# Patient Record
Sex: Female | Born: 1960
Health system: Southern US, Community
[De-identification: ages and names within clinical notes are randomized; demographics above are authoritative.]

## PROBLEM LIST (undated history)

## (undated) DIAGNOSIS — E785 Hyperlipidemia, unspecified: Secondary | ICD-10-CM

## (undated) DIAGNOSIS — K649 Unspecified hemorrhoids: Secondary | ICD-10-CM

## (undated) DIAGNOSIS — I10 Essential (primary) hypertension: Secondary | ICD-10-CM

## (undated) DIAGNOSIS — R7303 Prediabetes: Secondary | ICD-10-CM

## (undated) HISTORY — PX: TUBAL LIGATION: SHX77

## (undated) HISTORY — DX: Essential (primary) hypertension: I10

## (undated) HISTORY — DX: Unspecified hemorrhoids: K64.9

---

## 2017-01-25 ENCOUNTER — Encounter (INDEPENDENT_AMBULATORY_CARE_PROVIDER_SITE_OTHER): Payer: Self-pay | Admitting: Physician Assistant

## 2017-01-25 ENCOUNTER — Ambulatory Visit (INDEPENDENT_AMBULATORY_CARE_PROVIDER_SITE_OTHER): Payer: Self-pay | Admitting: Physician Assistant

## 2017-01-25 VITALS — BP 134/71 | HR 53 | Temp 98.6°F | Ht 62.5 in | Wt 186.2 lb

## 2017-01-25 DIAGNOSIS — S39012A Strain of muscle, fascia and tendon of lower back, initial encounter: Secondary | ICD-10-CM

## 2017-01-25 DIAGNOSIS — K0889 Other specified disorders of teeth and supporting structures: Secondary | ICD-10-CM

## 2017-01-25 DIAGNOSIS — R490 Dysphonia: Secondary | ICD-10-CM

## 2017-01-25 DIAGNOSIS — K219 Gastro-esophageal reflux disease without esophagitis: Secondary | ICD-10-CM

## 2017-01-25 MED ORDER — IBUPROFEN 600 MG PO TABS: 600.0000 mg | ORAL_TABLET | Freq: Three times a day (TID) | ORAL | 0 refills | Status: DC | PRN

## 2017-01-25 MED ORDER — OMEPRAZOLE 40 MG PO CPDR: 40.0000 mg | DELAYED_RELEASE_CAPSULE | Freq: Every day | ORAL | 3 refills | Status: DC

## 2017-01-25 MED ORDER — CYCLOBENZAPRINE HCL 10 MG PO TABS: 10.0000 mg | ORAL_TABLET | Freq: Three times a day (TID) | ORAL | 0 refills | Status: DC | PRN

## 2017-01-25 NOTE — Patient Instructions (Signed)
Esófago de Barrett  (Barrett Esophagus)  El esófago de Barrett aparece cuando el tejido que recubre el esófago se modifica o se daña. El esófago es el tubo que transporta los alimentos desde la boca al estómago. Con el esófago de Barrett, las células que recubren el esófago son reemplazadas por células similares a la mucosa del intestino (metaplasia intestinal).  Es posible que el esófago de Barrett por sí solo no cause síntomas. Sin embargo, muchas personas con esta afección también tienen enfermedad por reflujo gastroesofágico (ERGE), que puede causar síntomas como, por ejemplo, la acidez estomacal. El tratamiento puede incluir medicamentos, procedimientos para destruir las células anormales o cirugía. Con el tiempo, algunas personas con esta afección pueden tener cáncer de esófago.  CAUSAS  Se desconoce la causa exacta de esta afección. En algunos casos, se produce a partir de daños en el revestimiento del esófago a causa de la ERGE. La ERGE ocurre cuando los ácidos estomacales retroceden desde el estómago al esófago. Los síntomas frecuentes de ERGE pueden causar metaplasia intestinal o cambios a nivel celular (displasia).  FACTORES DE RIESGO  Los siguientes factores pueden hacer que usted sea propenso a sufrir esta afección:  · Tener ERGE.  · Tener cualquiera de las siguientes características:  ? Ser hombre.  ? Ser blanco (caucásico).  ? Obesos.  ? Ser mayor de 50 años.  · Tener una hernia de hiato.  · Fumar.  SÍNTOMAS  Con frecuencia, las personas que tienen esófago de Barrett no tienen síntomas. Sin embargo, muchas personas que padecen esta afección también tienen ERGE. Los síntomas de ERGE pueden incluir lo siguiente:  · Acidez estomacal.  · Dificultad para tragar.  · Tos seca.  DIAGNÓSTICO  El diagnóstico del esófago de Barrett se puede realizar por medio de un examen llamado endoscopia gastrointestinal superior. Durante este examen, se introduce un tubo delgado y flexible (endoscopio) hasta llegar al  esófago. El endoscopio tiene una luz y una cámara en el extremo. El médico usa el endoscopio para observar el interior del esófago. Durante el examen, se tomarán varias muestras de tejido (biopsias) del esófago para poder analizarlas en busca de metaplasia intestinal o displasia.  TRATAMIENTO  El tratamiento de esta afección puede incluir lo siguiente:  · Medicamentos (inhibidores de la bomba de protones o IBP) para reducir o detener la ERGE.  · Exámenes endoscópicos periódicos para asegurarse de que no aparezca cáncer.  · Un procedimiento o una cirugía para la displasia. Esto puede incluir lo siguiente:  ? La extirpación o destrucción endoscópica de las células anormales.  ? La extirpación de parte del esófago (esofagectomía).  INSTRUCCIONES PARA EL CUIDADO EN EL HOGAR  Comida y bebida  · Coma más frutas y verduras frescas.  · Evite los alimentos grasosos.  · Haga comidas pequeñas durante el día en lugar de comidas abundantes.  · No consuma los alimentos que le causan acidez estomacal. Ellos son:  ? Café y bebidas alcohólicas.  ? Tomates y comidas hechas con tomates.  ? Comidas grasosas o picantes.  ? Chocolate y menta.  Instrucciones generales  · Tome los medicamentos de venta libre y los recetados solamente como se lo haya indicado el médico.  · No consuma ningún producto que contenga tabaco, lo que incluye cigarrillos, tabaco de mascar y cigarrillos electrónicos. Si necesita ayuda para dejar de fumar, consulte al médico.  · Si el médico lo está tratando por ERGE, siga todas las instrucciones y tome los medicamentos como se lo hayan indicado.  · Concurra a   todas las visitas de control como se lo haya indicado el médico. Esto es importante.  SOLICITE ATENCIÓN MÉDICA SI:  · Tiene acidez estomacal o síntomas de ERGE.  · Tiene dificultad para tragar.  SOLICITE ATENCIÓN MÉDICA DE INMEDIATO SI:  · Siente dolor en el pecho.  · No puede tragar.  · Vomita sangre de color rojo brillante o una sustancia similar al poso  del café.  · La materia fecal (heces) son de color rojo brillante u oscuras.  Esta información no tiene como fin reemplazar el consejo del médico. Asegúrese de hacerle al médico cualquier pregunta que tenga.  Document Released: 01/31/2012 Document Revised: 11/23/2015 Document Reviewed: 05/14/2015  Elsevier Interactive Patient Education © 2018 Elsevier Inc.

## 2017-01-25 NOTE — Progress Notes (Addendum)
Subjective:  [name], female    DOB: 21-Nov-1960  Age: 56 y.o. MRN: [id number]  CC: establish care  HPI Frances Martin is a 56 y.o. female with PMH of GERD presents with LBP and voice hoarseness. Onset of LBP x 2 weeks. Left sided with radiculopathy down left leg. Pain rated 6/10. Pain characterized as a sharp pain that last approximeately 20-30 seconds. Worse with left rotation. Has a hx of a fall one year ago which bruised the coccyx but pain eventually self resolved. Has taken Ibuprofen with temporary relief. Had her husband massage her with relief of symptoms.     Also complains of voice hoarseness since approximately one and a half years ago. States she was singing when she felt a "rip" in the throat. Subsequently, she has never had a normal "clear" throat. Denies throat pain. Also relates having chronic issues with GERD. Occasional abdominal bloating. Has only taken Tums for acid reflux. [place] ago. Has drastically changed diet which has helped with GERD.   ROS Review of Systems  Constitutional: Negative for chills, fever and malaise/fatigue.  HENT:       Hoarse voice  Eyes: Negative for blurred vision.  Respiratory: Negative for shortness of breath.   Cardiovascular: Negative for chest pain and palpitations.  Gastrointestinal: Positive for abdominal pain (epigastric) and heartburn. Negative for blood in stool, constipation, diarrhea, melena, nausea and vomiting.       Abdominal bloating.  Genitourinary: Negative for dysuria and hematuria.  Musculoskeletal: Positive for back pain. Negative for joint pain and myalgias.  Skin: Negative for rash.  Neurological: Negative for tingling and headaches.  Psychiatric/Behavioral: Negative for depression. The patient is not nervous/anxious.     Objective:  BP 134/71 (BP Location: Left Arm, Patient Position: Sitting, Cuff Size: Normal)   Pulse (!) 53   Temp 98.6 F (37 C) (Oral)    Ht 5' 2.5" (1.588 m)   Wt 186 lb 3.2 oz (84.5 kg)   SpO2 97%   BMI 33.51 kg/m   BP/Weight 01/25/2017  Systolic BP 134  Diastolic BP 71  Wt. (Lbs) 186.2  BMI 33.51      Physical Exam  Constitutional: She is oriented to person, place, and time.  Well developed, obese, NAD, polite  HENT:  Head: Normocephalic and atraumatic.  Moderately hoarse voice. Normal oropharynx and tonsils. Tooth #13 with cavity  Eyes: Conjunctivae are normal. No scleral icterus.  Neck: Normal range of motion. Neck supple. No thyromegaly present.  Cardiovascular: Normal rate, regular rhythm and normal heart sounds.   Pulmonary/Chest: Effort normal and breath sounds normal.  Musculoskeletal: She exhibits no edema or deformity.  Mildly increased muscular tonicity of the T and L spine paraspinals. Mild TTP over the left L4 region.  Lymphadenopathy:    She has no cervical adenopathy.  Neurological: She is alert and oriented to person, place, and time. No cranial nerve deficit. She exhibits normal muscle tone. Coordination normal.  Seated SLR negative on left side.  Skin: Skin is warm and dry. No rash noted. No erythema. No pallor.  Psychiatric: She has a normal mood and affect. Her behavior is normal. Thought content normal.  Vitals reviewed.    Assessment & Plan:   1. Strain of muscle, fascia and tendon of lower back, initial encounter - Begin cyclobenzaprine (FLEXERIL) 10 MG tablet; Take 1 tablet (10 mg total) by mouth 3 (three) times daily as needed for muscle spasms.  Dispense: 30  tablet; Refill: 0 - Begin ibuprofen (ADVIL,MOTRIN) 600 MG tablet; Take 1 tablet (600 mg total) by mouth every 8 (eight) hours as needed.  Dispense: 30 tablet; Refill: 0  2. Voice hoarseness - Begin omeprazole (PRILOSEC) 40 MG capsule; Take 1 capsule (40 mg total) by mouth daily.  Dispense: 30 capsule; Refill: 3 - Ambulatory referral to ENT  3. Gastroesophageal reflux disease, esophagitis presence not specified - Begin  omeprazole (PRILOSEC) 40 MG capsule; Take 1 capsule (40 mg total) by mouth daily.  Dispense: 30 capsule; Refill: 3 - H. pylori antibody, IgG  4. Tooth ache - Ambulatory referral to Dentistry   Meds ordered this encounter  Medications  . cyclobenzaprine (FLEXERIL) 10 MG tablet    Sig: Take 1 tablet (10 mg total) by mouth 3 (three) times daily as needed for muscle spasms.    Dispense:  30 tablet    Refill:  0    Order Specific Question:   Supervising Provider    Answer:   Quentin Angst [id number]  . ibuprofen (ADVIL,MOTRIN) 600 MG tablet    Sig: Take 1 tablet (600 mg total) by mouth every 8 (eight) hours as needed.    Dispense:  30 tablet    Refill:  0    Order Specific Question:   Supervising Provider    Answer:   Quentin Angst [id number]  . omeprazole (PRILOSEC) 40 MG capsule    Sig: Take 1 capsule (40 mg total) by mouth daily.    Dispense:  30 capsule    Refill:  3    Order Specific Question:   Supervising Provider    Answer:   Quentin Angst [[id number]]    Follow-up: Return in about 4 weeks (around 02/22/2017) for full physical.   Loletta Specter PA

## 2017-01-26 LAB — H. PYLORI ANTIBODY, IGG

## 2017-02-10 ENCOUNTER — Ambulatory Visit (INDEPENDENT_AMBULATORY_CARE_PROVIDER_SITE_OTHER): Payer: Self-pay

## 2017-02-13 ENCOUNTER — Ambulatory Visit (INDEPENDENT_AMBULATORY_CARE_PROVIDER_SITE_OTHER): Payer: Self-pay | Admitting: Physician Assistant

## 2017-02-24 ENCOUNTER — Ambulatory Visit: Payer: Self-pay | Attending: Internal Medicine

## 2017-03-13 ENCOUNTER — Ambulatory Visit (INDEPENDENT_AMBULATORY_CARE_PROVIDER_SITE_OTHER): Payer: Self-pay | Admitting: Physician Assistant

## 2017-03-16 ENCOUNTER — Ambulatory Visit (INDEPENDENT_AMBULATORY_CARE_PROVIDER_SITE_OTHER): Payer: Self-pay | Admitting: Physician Assistant

## 2017-03-16 ENCOUNTER — Encounter (INDEPENDENT_AMBULATORY_CARE_PROVIDER_SITE_OTHER): Payer: Self-pay | Admitting: Physician Assistant

## 2017-03-16 VITALS — BP 129/80 | HR 70 | Temp 98.8°F | Wt 187.4 lb

## 2017-03-16 DIAGNOSIS — M25562 Pain in left knee: Secondary | ICD-10-CM

## 2017-03-16 DIAGNOSIS — R490 Dysphonia: Secondary | ICD-10-CM

## 2017-03-16 DIAGNOSIS — H8111 Benign paroxysmal vertigo, right ear: Secondary | ICD-10-CM

## 2017-03-16 DIAGNOSIS — G8929 Other chronic pain: Secondary | ICD-10-CM

## 2017-03-16 DIAGNOSIS — M545 Low back pain: Secondary | ICD-10-CM

## 2017-03-16 DIAGNOSIS — S39012A Strain of muscle, fascia and tendon of lower back, initial encounter: Secondary | ICD-10-CM

## 2017-03-16 MED ORDER — CYCLOBENZAPRINE HCL 10 MG PO TABS: 10.0000 mg | ORAL_TABLET | Freq: Three times a day (TID) | ORAL | 0 refills | Status: DC | PRN

## 2017-03-16 MED ORDER — IBUPROFEN 600 MG PO TABS: 600.0000 mg | ORAL_TABLET | Freq: Three times a day (TID) | ORAL | 0 refills | Status: DC | PRN

## 2017-03-16 NOTE — Progress Notes (Signed)
Subjective:  Patient ID: Frances BullsSusana Martinez Martin, female    DOB: 05-14-1961  Age: 56 y.o. MRN: [id number]  CC: leg pain  HPI Frances Pollyann SamplesMartinez Martin is a 56 y.o. female with PMH of GERD presents with left knee pain that radiates to the left buttock. Worse with flexion of the knee and when sitting on knee for prolonged period.     Back pain is still present but to a lesser degree. Says ibuprofen and cyclobenzaprine were helpful.    Had a few days of positional vertigo that self resolved. No other associated symptoms. No current vertigo.    States no one called her from ENT. Still has hoarse voice and would like to find out what is causing it.   Outpatient Medications Prior to Visit  Medication Sig Dispense Refill  . cyclobenzaprine (FLEXERIL) 10 MG tablet Take 1 tablet (10 mg total) by mouth 3 (three) times daily as needed for muscle spasms. 30 tablet 0  . ibuprofen (ADVIL,MOTRIN) 600 MG tablet Take 1 tablet (600 mg total) by mouth every 8 (eight) hours as needed. 30 tablet 0  . omeprazole (PRILOSEC) 40 MG capsule Take 1 capsule (40 mg total) by mouth daily. 30 capsule 3   No facility-administered medications prior to visit.      ROS Review of Systems  Constitutional: Negative for chills, fever and malaise/fatigue.  Eyes: Negative for blurred vision.  Respiratory: Negative for shortness of breath.   Cardiovascular: Negative for chest pain and palpitations.  Gastrointestinal: Negative for abdominal pain and nausea.  Genitourinary: Negative for dysuria and hematuria.  Musculoskeletal: Negative for joint pain and myalgias.  Skin: Negative for rash.  Neurological: Negative for tingling and headaches.  Psychiatric/Behavioral: Negative for depression. The patient is not nervous/anxious.     Objective:  BP 129/80 (BP Location: Left Arm, Patient Position: Sitting, Cuff Size: Normal)   Pulse 70   Temp 98.8 F (37.1 C) (Oral)   Wt 187 lb 6.4 oz (85 kg)   SpO2 97%   BMI 33.73 kg/m    BP/Weight 03/16/2017 01/25/2017  Systolic BP 129 134  Diastolic BP 80 71  Wt. (Lbs) 187.4 186.2  BMI 33.73 33.51      Physical Exam  Constitutional: She is oriented to person, place, and time.  Well developed, well nourished, NAD, polite, hoarse voice  HENT:  Head: Normocephalic and atraumatic.  Eyes: No scleral icterus.  Cardiovascular: Normal rate, regular rhythm and normal heart sounds.   Pulmonary/Chest: Effort normal and breath sounds normal.  Musculoskeletal: She exhibits no edema.  Left knee with full aROM. Back with full aROM.  Neurological: She is alert and oriented to person, place, and time. No cranial nerve deficit. Coordination normal.  Skin: Skin is warm and dry. No rash noted. No erythema. No pallor.  Psychiatric: She has a normal mood and affect. Her behavior is normal. Thought content normal.  Vitals reviewed.    Assessment & Plan:    1. Chronic midline low back pain without sciatica - DG Lumbar Spine Complete; Future - cyclobenzaprine (FLEXERIL) 10 MG tablet; Take 1 tablet (10 mg total) by mouth 3 (three) times daily as needed for muscle spasms.  Dispense: 30 tablet; Refill: 0 - I demonstrated two low back stretches for patient to do at home.  2. Chronic pain of left knee - DG Knee Complete 4 Views Left; Future - ibuprofen (ADVIL,MOTRIN) 600 MG tablet; Take 1 tablet (600 mg total) by mouth every 8 (eight) hours as needed.  Dispense: 30  tablet; Refill: 0  3. Benign paroxysmal positional vertigo of right ear - self resolved. Educated patient on basic pathophysiology. Advised that she return to clinic if another episode occurs.  4. Strain of muscle, fascia and tendon of lower back, initial encounter - cyclobenzaprine (FLEXERIL) 10 MG tablet; Take 1 tablet (10 mg total) by mouth 3 (three) times daily as needed for muscle spasms.  Dispense: 30 tablet; Refill: 0 - ibuprofen (ADVIL,MOTRIN) 600 MG tablet; Take 1 tablet (600 mg total) by mouth every 8 (eight)  hours as needed.  Dispense: 30 tablet; Refill: 0  5. Hoarseness of voice - Ambulatory referral to ENT   Meds ordered this encounter  Medications  . cyclobenzaprine (FLEXERIL) 10 MG tablet    Sig: Take 1 tablet (10 mg total) by mouth 3 (three) times daily as needed for muscle spasms.    Dispense:  30 tablet    Refill:  0    Order Specific Question:   Supervising Provider    Answer:   Quentin AngstJEGEDE, OLUGBEMIGA E [id number]  . ibuprofen (ADVIL,MOTRIN) 600 MG tablet    Sig: Take 1 tablet (600 mg total) by mouth every 8 (eight) hours as needed.    Dispense:  30 tablet    Refill:  0    Order Specific Question:   Supervising Provider    Answer:   Quentin AngstJEGEDE, OLUGBEMIGA E [[id number]]    Follow-up: Return in about 4 weeks (around 04/13/2017) for full pysical.   Loletta Specteroger David Harshaan Whang PA

## 2017-03-16 NOTE — Patient Instructions (Signed)
Vrtigo posicional benigno (Benign Positional Vertigo) El vrtigo es la sensacin de que usted o todo lo que lo rodea se mueven cuando en realidad eso no sucede. El vrtigo posicional benigno es el tipo de vrtigo ms comn. La causa de este trastorno no es grave (es benigna). Algunos movimientos y determinadas posiciones pueden desencadenar el trastorno (es posicional). El vrtigo posicional benigno puede ser peligroso si ocurre mientras est haciendo algo que podra suponer un riesgo para usted y para los dems, por ejemplo, conduciendo un automvil. CAUSAS En muchos de los casos, se desconoce la causa de este trastorno. Puede deberse a una alteracin en una zona del odo interno que ayuda al cerebro a percibir el movimiento y a coordinar el equilibrio. Esta alteracin puede deberse a una infeccin viral (laberintitis), a una lesin en la cabeza o a los movimientos reiterados. FACTORES DE RIESGO Es ms probable que esta afeccin se manifieste en:  Las mujeres.  Las personas mayores de 50aos. SNTOMAS Generalmente, los sntomas de este trastorno se presentan al mover la cabeza o los ojos en diferentes direcciones. Pueden aparecer repentinamente y suelen durar menos de un minuto. Entre los sntomas se pueden incluir los siguientes:  Prdida del equilibrio y cadas.  Sensacin de estar dando vueltas o movindose.  Sensacin de que el entorno est dando vueltas o movindose.  Nuseas y vmitos.  Visin borrosa.  Mareos.  Movimientos oculares involuntarios (nistagmo). Los sntomas pueden ser leves y algo fastidiosos, o pueden ser graves e interferir en la vida cotidiana. Los episodios de vrtigo posicional benigno pueden repetirse (ser recurrentes) a lo largo del tiempo, y algunos movimientos pueden desencadenarlos. Los sntomas pueden mejorar con el tiempo. DIAGNSTICO Generalmente, este trastorno se diagnostica con una historia clnica y un examen fsico de la cabeza, el cuello y los  odos. Tal vez lo deriven a un mdico especialista en problemas de la garganta, la nariz y el odo (otorrinolaringlogo), o a uno que se especializa en trastornos del sistema nervioso (neurlogo). Pueden hacerle otros estudios, entre ellos:  Resonancia magntica.  Tomografa computarizada.  Estudios de los movimientos oculares. El mdico puede pedirle que cambie rpidamente de posicin mientras observa si se presentan sntomas de vrtigo posicional benigno, por ejemplo, nistagmo. Los movimientos oculares se pueden estudiar con una electronistagmografa (ENG), con estimulacin trmica, mediante la maniobra de Dix-Hallpike o con la prueba de rotacin.  Electroencefalograma (EEG). Este estudio registra la actividad elctrica del cerebro.  Pruebas de audicin. TRATAMIENTO Generalmente, para tratar este trastorno, el mdico le har movimientos especficos con la cabeza para que el odo interno se normalice. Cuando los casos son graves, tal vez haya que realizar una ciruga, pero esto no es frecuente. En algunos casos, el vrtigo posicional benigno se resuelve por s solo en el trmino de 2 o 4semanas. INSTRUCCIONES PARA EL CUIDADO EN EL HOGAR Seguridad  Muvase lentamente.No haga movimientos bruscos con el cuerpo o con la cabeza.  No conduzca.  No opere maquinaria pesada.  No haga ninguna tarea que podra ser peligrosa para usted o para otras personas en caso de que ocurriera un episodio de vrtigo.  Si tiene dificultad para caminar o mantener el equilibrio, use un bastn para mantener la estabilidad. Si se siente mareado o inestable, sintese de inmediato.  Reanude sus actividades normales como se lo haya indicado el mdico. Pregntele al mdico qu actividades son seguras para usted. Instrucciones generales  Tome los medicamentos de venta libre y los recetados solamente como se lo haya indicado el mdico.    Evite algunas posiciones o determinados movimientos como se lo haya indicado el  mdico.  Beba suficiente lquido para mantener la orina clara o de color amarillo plido.  Concurra a todas las visitas de control como se lo haya indicado el mdico. Esto es importante. SOLICITE ATENCIN MDICA SI:  Tiene fiebre.  El trastorno empeora, o le aparecen sntomas nuevos.  Sus familiares o amigos advierten cambios en su comportamiento.  Las nuseas o los vmitos empeoran.  Tiene sensacin de hormigueo o de adormecimiento. SOLICITE ATENCIN MDICA DE INMEDIATO SI:  Tiene dificultad para hablar o para moverse.  Esta mareado todo el tiempo.  Se desmaya.  Tiene dolores de cabeza intensos.  Tiene debilidad en los brazos o las piernas.  Tiene cambios en la audicin o la visin.  Siente rigidez en el cuello.  Tiene sensibilidad a la luz. Esta informacin no tiene como fin reemplazar el consejo del mdico. Asegrese de hacerle al mdico cualquier pregunta que tenga. Document Released: 11/17/2008 Document Revised: 04/22/2015 Document Reviewed: 11/24/2014 Elsevier Interactive Patient Education  2018 Elsevier Inc.  

## 2017-03-17 ENCOUNTER — Ambulatory Visit (HOSPITAL_COMMUNITY)
Admission: RE | Admit: 2017-03-17 | Discharge: 2017-03-17 | Disposition: A | Discharge status: Home / Self Care | Payer: Self-pay | Source: Ambulatory Visit | Attending: Physician Assistant | Admitting: Physician Assistant

## 2017-03-17 DIAGNOSIS — G8929 Other chronic pain: Secondary | ICD-10-CM | POA: Insufficient documentation

## 2017-03-17 DIAGNOSIS — M545 Low back pain, unspecified: Secondary | ICD-10-CM

## 2017-03-17 DIAGNOSIS — M25562 Pain in left knee: Secondary | ICD-10-CM | POA: Insufficient documentation

## 2017-04-13 ENCOUNTER — Other Ambulatory Visit (HOSPITAL_COMMUNITY)
Admission: RE | Admit: 2017-04-13 | Discharge: 2017-04-13 | Disposition: A | Discharge status: Home / Self Care | Payer: Self-pay | Source: Ambulatory Visit | Attending: Physician Assistant | Admitting: Physician Assistant

## 2017-04-13 ENCOUNTER — Ambulatory Visit (INDEPENDENT_AMBULATORY_CARE_PROVIDER_SITE_OTHER): Payer: Self-pay | Admitting: Physician Assistant

## 2017-04-13 ENCOUNTER — Encounter (INDEPENDENT_AMBULATORY_CARE_PROVIDER_SITE_OTHER): Payer: Self-pay | Admitting: Physician Assistant

## 2017-04-13 VITALS — BP 132/83 | HR 78 | Temp 98.2°F | Ht 62.21 in | Wt 185.8 lb

## 2017-04-13 DIAGNOSIS — Z1239 Encounter for other screening for malignant neoplasm of breast: Secondary | ICD-10-CM

## 2017-04-13 DIAGNOSIS — Z23 Encounter for immunization: Secondary | ICD-10-CM

## 2017-04-13 DIAGNOSIS — Z0000 Encounter for general adult medical examination without abnormal findings: Secondary | ICD-10-CM

## 2017-04-13 DIAGNOSIS — Z1211 Encounter for screening for malignant neoplasm of colon: Secondary | ICD-10-CM

## 2017-04-13 DIAGNOSIS — Z114 Encounter for screening for human immunodeficiency virus [HIV]: Secondary | ICD-10-CM

## 2017-04-13 DIAGNOSIS — Z124 Encounter for screening for malignant neoplasm of cervix: Secondary | ICD-10-CM

## 2017-04-13 DIAGNOSIS — Z1159 Encounter for screening for other viral diseases: Secondary | ICD-10-CM

## 2017-04-13 DIAGNOSIS — Z1231 Encounter for screening mammogram for malignant neoplasm of breast: Secondary | ICD-10-CM

## 2017-04-13 LAB — POCT URINALYSIS DIPSTICK
Bilirubin, UA: NEGATIVE
Blood, UA: NEGATIVE
Glucose, UA: NEGATIVE
Leukocytes, UA: NEGATIVE
Nitrite, UA: NEGATIVE
Protein, UA: 30
Spec Grav, UA: 1.025
Urobilinogen, UA: 0.2 U/dL
pH, UA: 5.5

## 2017-04-13 NOTE — Progress Notes (Signed)
Subjective:  [name], female    DOB: 1961/04/23  Age: 56 y.o. MRN: [id number]  CC: annual exam  HPI Frances Martin is a 56 y.o. female with a medical history of GERD presents for a full physical. Says she is feeling well including left knee pain and LBP. Took ibuprofen as indicated and performed the stretches she was shown in clinic. Still waiting on ENT appointment. Thinks she was called by ENT but she did not understand [place]. Hoarseness continues. Does not have any other complaints.        Outpatient Medications Prior to Visit  Medication Sig Dispense Refill  . cyclobenzaprine (FLEXERIL) 10 MG tablet Take 1 tablet (10 mg total) by mouth 3 (three) times daily as needed for muscle spasms. 30 tablet 0  . ibuprofen (ADVIL,MOTRIN) 600 MG tablet Take 1 tablet (600 mg total) by mouth every 8 (eight) hours as needed. 30 tablet 0  . omeprazole (PRILOSEC) 40 MG capsule Take 1 capsule (40 mg total) by mouth daily. 30 capsule 3   No facility-administered medications prior to visit.      ROS Review of Systems  Constitutional: Negative for chills, fever and malaise/fatigue.  Eyes: Negative for blurred vision.  Respiratory: Negative for shortness of breath.   Cardiovascular: Negative for chest pain and palpitations.  Gastrointestinal: Negative for abdominal pain and nausea.  Genitourinary: Negative for dysuria and hematuria.  Musculoskeletal: Negative for joint pain and myalgias.  Skin: Negative for rash.  Neurological: Negative for tingling and headaches.  Psychiatric/Behavioral: Negative for depression. The patient is not nervous/anxious.     Objective:  BP 132/83 (BP Location: Left Arm, Patient Position: Sitting, Cuff Size: Normal)   Pulse 78   Temp 98.2 F (36.8 C) (Oral)   Ht 5' 2.21" (1.58 m)   Wt 185 lb 12.8 oz (84.3 kg)   SpO2 94%   BMI 33.76 kg/m   BP/Weight 04/13/2017 03/16/2017 01/25/2017  Systolic BP 132 129 134  Diastolic BP 83 80 71   Wt. (Lbs) 185.8 187.4 186.2  BMI 33.76 33.73 33.51      Physical Exam  Constitutional: She is oriented to person, place, and time.  Well developed, obese, NAD, polite  HENT:  Head: Normocephalic and atraumatic.  Mouth/Throat: No oropharyngeal exudate.  Eyes: Pupils are equal, round, and reactive to light. Conjunctivae and EOM are normal. No scleral icterus.  Neck: Normal range of motion. Neck supple. No thyromegaly present.  Cardiovascular: Normal rate, regular rhythm and normal heart sounds.   Pulmonary/Chest: Effort normal and breath sounds normal. No respiratory distress.  Abdominal: Soft. Bowel sounds are normal. She exhibits no distension. There is no tenderness. There is no rebound and no guarding.  Genitourinary:  Genitourinary Comments: Mild vulvar erythema, atrophic vaginitis, small amount of thick white secretion. Cervical Os pinpoint. No adnexal tenderness or mass bilaterally. No uterine tenderness or mass.  Musculoskeletal: She exhibits no edema.  Lymphadenopathy:    She has no cervical adenopathy.  Neurological: She is alert and oriented to person, place, and time. No cranial nerve deficit. Coordination normal.  Skin: Skin is warm and dry. No rash noted. No erythema. No pallor.  Psychiatric: She has a normal mood and affect. Her behavior is normal. Thought content normal.  Vitals reviewed.    Assessment & Plan:     1. Annual physical exam - Comprehensive metabolic panel - Lipid panel - TSH - CBC with Differential/Platelet  2. Screening for cervical cancer - Cytology - PAP  [place]  3. Screening for breast cancer - [place] DIGITAL SCREENING BILATERAL; Future  4. Special screening for malignant neoplasms, colon - Fecal occult blood, imunochemical  5. Need for Tdap vaccination - Tdap vaccine greater than or equal to 7yo IM  6. Encounter for screening for HIV - HIV antibody  7. Need for hepatitis C screening test - Hepatitis c antibody  (reflex)     Follow-up: PRN  Loletta Specter PA

## 2017-04-13 NOTE — Patient Instructions (Signed)
Mammogram A mammogram is an X-ray of the breasts that is done to check for abnormal changes. This procedure can screen for and detect any changes that may suggest breast cancer. A mammogram can also identify other changes and variations in the breast, such as:  Inflammation of the breast tissue (mastitis).  An infected area that contains a collection of pus (abscess).  A fluid-filled sac (cyst).  Fibrocystic changes. This is when breast tissue becomes denser, which can make the tissue feel rope-like or uneven under the skin.  Tumors that are not cancerous (benign).  Tell a health care provider about:  Any allergies you have.  If you have breast implants.  If you have had previous breast disease, biopsy, or surgery.  If you are breastfeeding.  Any possibility that you could be pregnant, if this applies.  If you are younger than age 25.  If you have a family history of breast cancer. What are the risks? Generally, this is a safe procedure. However, problems may occur, including:  Exposure to radiation. Radiation levels are very low with this test.  The results being misinterpreted.  The need for further tests.  The inability of the mammogram to detect certain cancers.  What happens before the procedure?  Schedule your test about 1-2 weeks after your menstrual period. This is usually when your breasts are the least tender.  If you have had a mammogram done at a different facility in the past, get the mammogram X-rays or have them sent to your current exam facility in order to compare them.  Wash your breasts and under your arms the day of the test.  Do not wear deodorants, perfumes, lotions, or powders anywhere on your body on the day of the test.  Remove any jewelry from your neck.  Wear clothes that you can change into and out of easily. What happens during the procedure?  You will undress from the waist up and put on a gown.  You will stand in front of the  X-ray machine.  Each breast will be placed between two plastic or glass plates. The plates will compress your breast for a few seconds. Try to stay as relaxed as possible during the procedure. This does not cause any harm to your breasts and any discomfort you feel will be very brief.  X-rays will be taken from different angles of each breast. The procedure may vary among health care providers and hospitals. What happens after the procedure?  The mammogram will be examined by a specialist (radiologist).  You may need to repeat certain parts of the test, depending on the quality of the images. This is commonly done if the radiologist needs a better view of the breast tissue.  Ask when your test results will be ready. Make sure you get your test results.  You may resume your normal activities. This information is not intended to replace advice given to you by your health care provider. Make sure you discuss any questions you have with your health care provider. Document Released: 07/29/2000 Document Revised: 01/04/2016 Document Reviewed: 10/10/2014 Elsevier Interactive Patient Education  2017 Elsevier Inc.  

## 2017-04-14 ENCOUNTER — Other Ambulatory Visit (INDEPENDENT_AMBULATORY_CARE_PROVIDER_SITE_OTHER): Payer: Self-pay

## 2017-04-15 LAB — HEPATITIS C ANTIBODY (REFLEX): HCV Ab: <0.1 s/co ratio (ref 0.0–0.9)

## 2017-04-15 LAB — COMPREHENSIVE METABOLIC PANEL
ALK PHOS: 88 IU/L (ref 39–117)
ALT: 25 IU/L (ref 0–32)
AST: 18 IU/L (ref 0–40)
Albumin/Globulin Ratio: 1.3 (ref 1.2–2.2)
Albumin: 4.2 g/dL (ref 3.5–5.5)
BILIRUBIN TOTAL: 0.3 mg/dL (ref 0.0–1.2)
BUN/Creatinine Ratio: 20 (ref 9–23)
BUN: 15 mg/dL (ref 6–24)
CHLORIDE: 105 mmol/L (ref 96–106)
CO2: 22 mmol/L (ref 20–29)
CREATININE: 0.76 mg/dL (ref 0.57–1.00)
Calcium: 9.6 mg/dL (ref 8.7–10.2)
GFR calc Af Amer: 102 mL/min/{1.73_m2} (ref >59)
GFR calc non Af Amer: 89 mL/min/{1.73_m2} (ref >59)
GLUCOSE: 113 mg/dL — AB (ref 65–99)
Globulin, Total: 3.2 g/dL (ref 1.5–4.5)
Potassium: 4.4 mmol/L (ref 3.5–5.2)
Sodium: 142 mmol/L (ref 134–144)
TOTAL PROTEIN: 7.4 g/dL (ref 6.0–8.5)

## 2017-04-15 LAB — CBC WITH DIFFERENTIAL/PLATELET
BASOS: 1 %
Basophils Absolute: 0 10*3/uL (ref 0.0–0.2)
EOS (ABSOLUTE): 0.1 10*3/uL (ref 0.0–0.4)
EOS: 2 %
HEMATOCRIT: 42.3 % (ref 34.0–46.6)
HEMOGLOBIN: 13.7 g/dL (ref 11.1–15.9)
IMMATURE GRANS (ABS): 0 10*3/uL (ref 0.0–0.1)
IMMATURE GRANULOCYTES: 0 %
LYMPHS: 27 %
Lymphocytes Absolute: 2.1 10*3/uL (ref 0.7–3.1)
MCH: 29 pg (ref 26.6–33.0)
MCHC: 32.4 g/dL (ref 31.5–35.7)
MCV: 90 fL (ref 79–97)
MONOCYTES: 5 %
Monocytes Absolute: 0.4 10*3/uL (ref 0.1–0.9)
NEUTROS PCT: 65 %
Neutrophils Absolute: 5.1 10*3/uL (ref 1.4–7.0)
Platelets: 304 10*3/uL (ref 150–379)
RBC: 4.72 x10E6/uL (ref 3.77–5.28)
RDW: 14.8 % (ref 12.3–15.4)
WBC: 7.8 10*3/uL (ref 3.4–10.8)

## 2017-04-15 LAB — HCV COMMENT:

## 2017-04-15 LAB — LIPID PANEL
CHOLESTEROL TOTAL: 184 mg/dL (ref 100–199)
Chol/HDL Ratio: 3.2 ratio (ref 0.0–4.4)
HDL: 57 mg/dL (ref >39)
LDL Calculated: 109 mg/dL — ABNORMAL HIGH (ref 0–99)
TRIGLYCERIDES: 91 mg/dL (ref 0–149)
VLDL Cholesterol Cal: 18 mg/dL (ref 5–40)

## 2017-04-15 LAB — HIV ANTIBODY (ROUTINE TESTING W REFLEX): HIV SCREEN 4TH GENERATION: NONREACTIVE

## 2017-04-15 LAB — TSH: TSH: 2.2 u[IU]/mL (ref 0.450–4.500)

## 2017-04-18 LAB — CYTOLOGY - PAP
Adequacy: ABSENT
BACTERIAL VAGINITIS: NEGATIVE
Candida vaginitis: NEGATIVE
Chlamydia: NEGATIVE
Diagnosis: NEGATIVE
NEISSERIA GONORRHEA: NEGATIVE
TRICH (WINDOWPATH): NEGATIVE

## 2017-04-19 LAB — FECAL OCCULT BLOOD, IMMUNOCHEMICAL: Fecal Occult Bld: NEGATIVE

## 2017-04-21 ENCOUNTER — Telehealth (INDEPENDENT_AMBULATORY_CARE_PROVIDER_SITE_OTHER): Payer: Self-pay

## 2017-04-21 NOTE — Telephone Encounter (Signed)
-----   Message from Loletta Specteroger David Gomez, PA-C sent at 04/18/2017  1:24 PM EDT ----- PAP negative. Hep C, HIV, Thyroid, and CBC are normal/negative. Mildly elevated cholesterol and sugar. Advise to eat less fatty foods. We can check A1c here to confirm if there is diabetes or not.

## 2017-04-21 NOTE — Telephone Encounter (Signed)
MA used pacific interpreters to contact the patient. Interpreter Name: Gerald LeitzMiguel Interpreter ID: [id number] !!!Patient is aware of all labs being normal except for cholesterol and sugar level. Patient is aware of no blood being noted in the system and to limit fatty food intake. Patient will also have a DM screening at the next visit!!! Any concerns [facility] to [facility] [phone]

## 2017-05-11 ENCOUNTER — Ambulatory Visit (INDEPENDENT_AMBULATORY_CARE_PROVIDER_SITE_OTHER): Payer: Self-pay | Admitting: Otolaryngology

## 2017-05-11 DIAGNOSIS — R490 Dysphonia: Secondary | ICD-10-CM

## 2017-05-11 DIAGNOSIS — K219 Gastro-esophageal reflux disease without esophagitis: Secondary | ICD-10-CM

## 2017-06-07 ENCOUNTER — Other Ambulatory Visit (INDEPENDENT_AMBULATORY_CARE_PROVIDER_SITE_OTHER): Payer: Self-pay

## 2017-06-07 DIAGNOSIS — Z23 Encounter for immunization: Secondary | ICD-10-CM

## 2017-06-16 ENCOUNTER — Other Ambulatory Visit: Payer: Self-pay | Admitting: Obstetrics and Gynecology

## 2017-06-16 DIAGNOSIS — Z1231 Encounter for screening mammogram for malignant neoplasm of breast: Secondary | ICD-10-CM

## 2017-06-26 ENCOUNTER — Ambulatory Visit (INDEPENDENT_AMBULATORY_CARE_PROVIDER_SITE_OTHER): Payer: Self-pay | Admitting: Physician Assistant

## 2017-06-26 ENCOUNTER — Encounter (INDEPENDENT_AMBULATORY_CARE_PROVIDER_SITE_OTHER): Payer: Self-pay | Admitting: Physician Assistant

## 2017-06-26 VITALS — BP 121/74 | HR 75 | Temp 98.1°F | Resp 18 | Ht 62.0 in | Wt 201.0 lb

## 2017-06-26 DIAGNOSIS — M25542 Pain in joints of left hand: Secondary | ICD-10-CM

## 2017-06-26 DIAGNOSIS — M25541 Pain in joints of right hand: Secondary | ICD-10-CM

## 2017-06-26 DIAGNOSIS — R5383 Other fatigue: Secondary | ICD-10-CM

## 2017-06-26 DIAGNOSIS — G4700 Insomnia, unspecified: Secondary | ICD-10-CM

## 2017-06-26 MED ORDER — NAPROXEN 500 MG PO TABS: 500.0000 mg | ORAL_TABLET | Freq: Two times a day (BID) | ORAL | 0 refills | Status: DC

## 2017-06-26 MED ORDER — VITAMIN D3 125 MCG (5000 UT) PO CAPS: 1.0000 | ORAL_CAPSULE | Freq: Every day | ORAL | 0 refills | Status: DC

## 2017-06-26 MED ORDER — ZOLPIDEM TARTRATE 5 MG PO TABS: 5.0000 mg | ORAL_TABLET | Freq: Every evening | ORAL | 1 refills | Status: DC | PRN

## 2017-06-26 NOTE — Patient Instructions (Signed)
Trastorno de ansiedad generalizada (Generalized Anxiety Disorder) El trastorno de ansiedad generalizada es un trastorno mental. Interfiere en las funciones vitales, incluyendo las Bayou Goularelaciones, el trabajo y la escuela.  Es diferente de la ansiedad normal que todas las personas experimentan en algn momento de su vida en respuesta a sucesos y Chief Operating Officeractividades especficas. En verdad, la ansiedad normal nos ayuda a prepararnos y Human resources officeratravesar estos acontecimientos y actividades de la vida. La ansiedad normal desaparece despus de que el evento o la actividad ha finalizado.  El trastorno de ansiedad generalizada no est necesariamente relacionada con eventos o actividades especficas. Tambin causa un exceso de ansiedad en proporcin a sucesos o actividades especficas. En este trastorno la ansiedad es difcil de Chief Operating Officercontrolar. Los sntomas pueden variar de leves a muy graves. Las personas que sufren de trastorno de ansiedad generalizada pueden tener intensas olas de ansiedad con sntomas fsicos (ataques de pnico).  SNTOMAS  La ansiedad y la preocupacin asociada a este trastorno son difciles de Chief Operating Officercontrolar. Esta ansiedad y la preocupacin estn relacionados con muchos eventos de la vida y sus actividades y tambin ocurre durante ms [employer] de los que no ocurre, durante 6 meses o ms. Las personas que la sufren pueden tener tres o ms de los siguientes sntomas (uno o ms en los nios):   Glass blower/designerAgitacin   Fatiga.  Dificultades de concentracin.   Irritabilidad.  Tensin muscular  Dificultad para dormirse o sueo poco satisfactorio. DIAGNSTICO  Se diagnostica a travs de una evaluacin realizada por el mdico. El mdico le har preguntas acerca de su estado de nimo, sntomas fsicos y sucesos de Oregonsu vida. Le har preguntas sobre su historia clnica, el consumo de alcohol o drogas, incluyendo los medicamentos recetados. Nucor Corporationambin le har un examen fsico e indicar anlisis de Woodwaysangre. Ciertas enfermedades y el uso de  determinadas sustancias pueden causar sntomas similares a este trastorno. Su mdico lo puede derivar a Music therapistun especialista en salud mental para una evaluacin ms profunda.Gerlean Ren.  TRATAMIENTO  Las terapias siguientes se utilizan en el tratamiento de este trastorno:   Medicamentos - Se recetan antidepresivos para el control diario a Air cabin crewlargo plazo. Pueden indicarse tambin medicamentos para combatir la Cox Communicationsansiedad en los casos graves, especialmente cuando ocurren ataques de pnico.   Terapia conversada (psicoterapia) Ciertos tipos de psicoterapia pueden ser tiles en el tratamiento del trastorno de ansiedad generalizada, proporcionando apoyo, educacin y Optometristorientacin. Una forma de psicoterapia llamada terapia cognitivo-conductual puede ensearle formas saludables de pensar y Publishing rights managerreaccionar a los eventos y actividades de la vida diaria.  Tcnicasde manejo del estrs- Estas tcnicas incluyen el yoga, la meditacin y el ejercicio y pueden ser muy tiles cuando se practican con regularidad. Un especialista en salud mental puede ayudar a determinar qu tratamiento es mejor para usted. Algunas personas obtienen mejora con una terapia. Sin embargo, Economistotras personas requieren una combinacin de terapias.  Esta informacin no tiene Theme park managercomo fin reemplazar el consejo del mdico. Asegrese de hacerle al mdico cualquier pregunta que tenga. Document Released: 11/26/2012 Document Revised: 08/22/2014 Elsevier Interactive Patient Education  2017 [employer]vier [employer] Depresin mayor (Major Depressive Disorder) La depresin mayor es un trastorno del estado de nimo. No es lo mismo que sentir tristeza cuando ocurre algo malo. Este trastorno puede [address] das o [address]. Trabajar o hacer cosas con la familia y los amigos puede volverse difcil debido a este trastorno. La depresin mayor puede causar lo siguiente en las personas que la padecen:  Nutritional therapistTristeza.  [place] NationVaco.  Desesperanza.  Impotencia.  Irritabilidad. Adems de estos sentimientos, las  personas que padecen este trastorno tienen por lo menos 4de estos sntomas:  Problemas para dormir.  Dormir demasiado.  Un cambio importante en el apetito.  Un cambio importante en el peso.  Falta de energa.  Sentimientos de culpa o inutilidad.  Dificultad para concentrarse, recordar o tomar decisiones.  Movimientos lentos.  Agitacin.  Pensamientos o sueos de suicidio, o de daarse a s mismas.  Intento de suicidio. CUIDADOS EN EL HOGAR  Tome los medicamentos de venta libre y los recetados solamente como se lo haya indicado el mdico.  OceanographerConcurra a todas las visitas de control como se lo haya indicado el mdico. Esto incluye cualquier terapia que le recomiende el mdico.  Reduzca el nivel de estrs. Haga cosas que disfruta, como andar en bicicleta, caminar o leer un libro.  Haga ejercicio fsico con frecuencia.  Consuma una dieta saludable.  No beba alcohol.  No consuma drogas.  Busque el apoyo de sus familiares y Personnel officeramigos.  SOLICITE AYUDA DE INMEDIATO SI:  Empieza a escuchar voces.  Ve cosas que no existen.  Siente que las personas lo persiguen (paranoico).  Tiene pensamientos serios acerca de lastimarse a usted mismo o daar a Economistotras personas.  Piensa en el suicidio.  Esta informacin no tiene Theme park managercomo fin reemplazar el consejo del mdico. Asegrese de hacerle al mdico cualquier pregunta que tenga. Document Released: 07/13/2015 Document Revised: 07/13/2015 Document Reviewed: 08/27/2012 Elsevier Interactive Patient Education  2017 [employer]vier Inc.

## 2017-06-26 NOTE — Progress Notes (Signed)
Subjective:  [name], female    DOB: March 21, 1961  Age: 56 y.o. MRN: [id number]  CC: fatigue  HPI  Frances Martin is a 56 y.o. female with a medical history of GERD presents with complaint of fatigue attributed to insomnia. Onset two months ago. Difficulty in maintaining sleep. Difficulty going back to sleep. Sometimes wakes up with pain in the hands. No set rhythm to the insomnia but estimates insomnia occurs up to three times per week. Often thinks about her daughter which was deported. Pt now in charge of raising her granddaughter.     Pain in the PIP of all the fingers bilaterally. Associated with numbness and tingling. Also complains of trigger finger in all the fingers. Does not attribute pains and symptoms to activities; she is a housewife. Says mother had rheumatoid arthritis and father had issues with finger numbness and weakness.    Outpatient Medications Prior to Visit  Medication Sig Dispense Refill  . cyclobenzaprine (FLEXERIL) 10 MG tablet Take 1 tablet (10 mg total) by mouth 3 (three) times daily as needed for muscle spasms. (Patient not taking: Reported on 06/26/2017) 30 tablet 0  . ibuprofen (ADVIL,MOTRIN) 600 MG tablet Take 1 tablet (600 mg total) by mouth every 8 (eight) hours as needed. (Patient not taking: Reported on 06/26/2017) 30 tablet 0  . omeprazole (PRILOSEC) 40 MG capsule Take 1 capsule (40 mg total) by mouth daily. (Patient not taking: Reported on 06/26/2017) 30 capsule 3   No facility-administered medications prior to visit.      ROS Review of Systems  Constitutional: Negative for chills, fever and malaise/fatigue.  Eyes: Negative for blurred vision.  Respiratory: Negative for shortness of breath.   Cardiovascular: Negative for chest pain and palpitations.  Gastrointestinal: Negative for abdominal pain and nausea.  Genitourinary: Negative for dysuria and hematuria.  Musculoskeletal: Negative for joint pain and myalgias.   Skin: Negative for rash.  Neurological: Negative for tingling and headaches (sometimes on sunny days).  Psychiatric/Behavioral: Positive for depression. The patient is nervous/anxious.     Objective:  BP 121/74 (BP Location: Right Arm, Patient Position: Sitting, Cuff Size: Large)   Pulse 75   Temp 98.1 F (36.7 C) (Oral)   Resp 18   Ht 5\' 2"  (1.575 m)   Wt 201 lb (91.2 kg)   SpO2 96%   BMI 36.76 kg/m   BP/Weight 06/26/2017 04/13/2017 03/16/2017  Systolic BP 121 132 129  Diastolic BP 74 83 80  Wt. (Lbs) 201 185.8 187.4  BMI 36.76 33.76 33.73      Physical Exam  Constitutional: She is oriented to person, place, and time.  Well developed, well nourished, NAD, polite  HENT:  Head: Normocephalic and atraumatic.  Eyes: No scleral icterus.  Neck: Normal range of motion. Neck supple. No thyromegaly present.  Cardiovascular: Normal rate, regular rhythm and normal heart sounds.  Pulmonary/Chest: Effort normal and breath sounds normal.  Musculoskeletal: She exhibits no edema.  Mild TTP in the PIP of all fingers. No deformity or trigger finger noted.   Neurological: She is alert and oriented to person, place, and time.  Skin: Skin is warm and dry. No rash noted. No erythema. No pallor.  Psychiatric: She has a normal mood and affect. Her behavior is normal. Thought content normal.  Vitals reviewed.    Assessment & Plan:   1. Arthralgia of both hands - Sedimentation Rate - C-reactive protein - ANA w/Reflex - Begin Naproxen 500 mg BID PRN, #30.  2. Insomnia, unspecified type - suspect psychological component. Pt in charge of raising granddaughter after the deportation of her daughter.  - Will address possible depression with anxiety after assessing labs and treatment efficacy.   3. Fatigue, unspecified type - Begin Vitamin D3 5000 units qday x30 days, no refills.   Meds ordered this encounter  Medications  . naproxen (NAPROSYN) 500 MG tablet    Sig: Take 1 tablet (500 mg  total) 2 (two) times daily with a meal by mouth.    Dispense:  30 tablet    Refill:  0    Order Specific Question:   Supervising Provider    Answer:   Quentin AngstJEGEDE, OLUGBEMIGA E [id number]]  . zolpidem (AMBIEN) 5 MG tablet    Sig: Take 1 tablet (5 mg total) at bedtime as needed by mouth for sleep.    Dispense:  15 tablet    Refill:  1    Order Specific Question:   Supervising Provider    Answer:   Quentin AngstJEGEDE, OLUGBEMIGA E [id number]]    Follow-up: Return in about 4 weeks (around 07/24/2017) for finger pain, insomnia, depression.   Loletta Specteroger David Gomez PA

## 2017-06-27 LAB — C-REACTIVE PROTEIN: CRP: 4.4 mg/L (ref 0.0–4.9)

## 2017-06-27 LAB — SEDIMENTATION RATE: SED RATE: 22 mm/h (ref 0–40)

## 2017-06-27 LAB — ANA W/REFLEX: Anti Nuclear Antibody(ANA): NEGATIVE

## 2017-06-28 ENCOUNTER — Telehealth: Payer: Self-pay

## 2017-06-28 NOTE — Telephone Encounter (Signed)
CMA call regarding lab results   Patient did not answer but left a VM stating the reason of the call & to call back  

## 2017-06-28 NOTE — Telephone Encounter (Signed)
-----   Message from Margaretmary LombardNubia K [place], [place]MA sent at 06/28/2017  4:26 PM EST ----- Please inform spanish speaking patient of results

## 2017-07-13 ENCOUNTER — Ambulatory Visit
Admission: RE | Admit: 2017-07-13 | Discharge: 2017-07-13 | Disposition: A | Discharge status: Home / Self Care | Payer: No Typology Code available for payment source | Source: Ambulatory Visit | Attending: Obstetrics and Gynecology | Admitting: Obstetrics and Gynecology

## 2017-07-13 ENCOUNTER — Encounter (HOSPITAL_COMMUNITY): Payer: Self-pay

## 2017-07-13 ENCOUNTER — Ambulatory Visit (HOSPITAL_COMMUNITY)
Admission: RE | Admit: 2017-07-13 | Discharge: 2017-07-13 | Disposition: A | Discharge status: Home / Self Care | Payer: Self-pay | Source: Ambulatory Visit | Attending: Obstetrics and Gynecology | Admitting: Obstetrics and Gynecology

## 2017-07-13 VITALS — BP 136/84 | Temp 98.2°F | Wt 188.0 lb

## 2017-07-13 DIAGNOSIS — Z1231 Encounter for screening mammogram for malignant neoplasm of breast: Secondary | ICD-10-CM

## 2017-07-13 DIAGNOSIS — Z1239 Encounter for other screening for malignant neoplasm of breast: Secondary | ICD-10-CM

## 2017-07-13 NOTE — Progress Notes (Signed)
No complaints today.   Pap Smear: Pap smear not completed today. [facility] Per patient has no history of an abnormal Pap smear. Last Pap smear result is in Epic.    Physical exam: Breasts Breasts symmetrical. No skin abnormalities bilateral breasts. No nipple retraction bilateral breasts. No nipple discharge bilateral breasts. No lymphadenopathy. No lumps palpated bilateral breasts. No complaints of pain or tenderness on exam. [facility]. Appointment scheduled for Thursday, July 13, 2017 at 1410.       Pelvic/Bimanual No Pap smear completed today since last Pap smear was 04/13/2017. Pap smear not indicated per BCCCP guidelines.   Smoking History: Patient is a current smoker. Discussed smoking cessation with patient. Ref[facility] and gave resources to [facility] classes [facility].  Patient Navigation: Patient education provided. Access to services provided [facility]. Spanish interpreter provided.   Colorectal Cancer Screening: Per patient has never had a colonoscopy completed. Patient completed the FOBT stools card test  04/14/2017 that was negative. No complaints today.  Used Spanish interpreter [employer] CorporationErika McReynolds from [facility].

## 2017-07-13 NOTE — Patient Instructions (Signed)
Explained breast self awareness with Frances Martin. Patient did not need a Pap smear today due to last Pap smear was 04/13/2017. Let her know BCCCP will cover Pap smears every 3 years unless has a history of abnormal Pap smears. [facility]. Appointment scheduled for Thursday, July 13, 2017 at 1410. Let patient know the Breast Center will follow up with her within the next couple weeks with results of mammogram by letter or phone. Discussed smoking cessation with patient. [facility] and gave resources to free [facility] classes [facility]. Tyeshia Pollyann SamplesMartinez Martin verbalized understanding.  Brannock, Kathaleen Maserhristine Poll, RN 1:53 PM

## 2017-07-14 ENCOUNTER — Encounter (HOSPITAL_COMMUNITY): Payer: Self-pay | Admitting: *Deleted

## 2017-07-25 ENCOUNTER — Ambulatory Visit (INDEPENDENT_AMBULATORY_CARE_PROVIDER_SITE_OTHER): Payer: Self-pay | Admitting: Physician Assistant

## 2017-07-26 ENCOUNTER — Other Ambulatory Visit: Payer: Self-pay

## 2017-07-26 ENCOUNTER — Encounter (INDEPENDENT_AMBULATORY_CARE_PROVIDER_SITE_OTHER): Payer: Self-pay | Admitting: Physician Assistant

## 2017-07-26 ENCOUNTER — Ambulatory Visit (INDEPENDENT_AMBULATORY_CARE_PROVIDER_SITE_OTHER): Payer: Self-pay | Admitting: Physician Assistant

## 2017-07-26 VITALS — BP 121/80 | HR 83 | Temp 97.9°F | Wt 186.8 lb

## 2017-07-26 DIAGNOSIS — M2550 Pain in unspecified joint: Secondary | ICD-10-CM

## 2017-07-26 DIAGNOSIS — R5383 Other fatigue: Secondary | ICD-10-CM

## 2017-07-26 DIAGNOSIS — K5900 Constipation, unspecified: Secondary | ICD-10-CM

## 2017-07-26 DIAGNOSIS — K643 Fourth degree hemorrhoids: Secondary | ICD-10-CM

## 2017-07-26 DIAGNOSIS — G4700 Insomnia, unspecified: Secondary | ICD-10-CM

## 2017-07-26 MED ORDER — VITAMIN D3 125 MCG (5000 UT) PO CAPS: 1.0000 | ORAL_CAPSULE | Freq: Every day | ORAL | 0 refills | Status: DC

## 2017-07-26 MED ORDER — DOCUSATE SODIUM 50 MG PO CAPS: 50.0000 mg | ORAL_CAPSULE | Freq: Two times a day (BID) | ORAL | 2 refills | Status: DC

## 2017-07-26 MED ORDER — NAPROXEN 500 MG PO TABS: 500.0000 mg | ORAL_TABLET | Freq: Two times a day (BID) | ORAL | 0 refills | Status: DC

## 2017-07-26 NOTE — Patient Instructions (Signed)
Hemorroides (Hemorrhoids) Las hemorroides son venas inflamadas adentro o alrededor del recto o del ano. Hay dos tipos de hemorroides:  Hemorroides internas. Se forman en las venas del interior del recto. Pueden abultarse hacia afuera, irritarse y doler.  Hemorroides externas. Se producen en las venas externas del ano y pueden sentirse como un bulto o zona hinchada, dura y dolorosa cerca del ano. La mayora de las hemorroides no causan problemas graves y se pueden controlar con tratamientos caseros como los cambios en la dieta y el estilo de vida. Si los tratamientos caseros no ayudan con los sntomas, se pueden realizar procedimientos para reducir o extirpar las hemorroides. CAUSAS La causa de esta afeccin es el aumento de la presin en la zona anal. Esta presin puede ser causada por distintos factores, por ejemplo:  Estreimiento.  Dificultad para defecar.  Diarrea.  Embarazo.  Obesidad.  Estar sentado durante largos perodos.  Levantar objetos pesados u otras actividades que impliquen esfuerzo.  Sexo anal. SNTOMAS Los sntomas de esta afeccin incluyen lo siguiente:  Dolor.  Picazn o irritacin anal.  Sangrado rectal.  Prdida de materia fecal (heces).  Inflamacin anal.  Uno o ms bultos alrededor del ano. DIAGNSTICO Esta afeccin se diagnostica frecuentemente a travs de un examen visual. Posiblemente le realicen otros tipos de pruebas o estudios, como los siguientes:  Examen de la zona rectal con una mano enguantada (examen digital rectal).  Examen del canal anal utilizando un pequeo tubo (anoscopio).  Anlisis de sangre si ha perdido una cantidad significativa de sangre.  Un estudio para observar el interior del colon (sigmoidoscopia o colonoscopia). TRATAMIENTO Esta afeccin generalmente se puede tratar en el hogar. Se pueden realizar diversos procedimientos si los cambios en la dieta, en el estilo de vida y otros tratamientos caseros no alivian los  sntomas. Estos procedimientos pueden ayudar a reducir o extirpar las hemorroides completamente. Algunos de estos procedimientos son quirrgicos y otros no. Algunos de los procedimientos ms frecuentes son los siguientes:  Ligadura con banda elstica. Las bandas elsticas se colocan en la base de las hemorroides para interrumpir la irrigacin de sangre.  Escleroterapia. Se inyecta un medicamento en las hemorroides para reducir su tamao.  Coagulacin con luz infrarroja. Se utiliza un tipo de energa lumnica para eliminar las hemorroides.  Hemorroidectoma. Las hemorroides se extirpan con ciruga y las venas que las irrigan se atan.  Hemorroidopexia con grapas. Se usa un dispositivo tipo grapa de forma circular para extirpar las hemorroides y unas grapas para cortar la sangre que se irriga hacia las hemorroides. INSTRUCCIONES PARA EL CUIDADO EN EL HOGAR Comida y bebida  Consuma alimentos con alto contenido de fibra, como cereales integrales, porotos, frutos secos, frutas y verduras. Pregntele a su mdico acerca de tomar productos con fibra aadida en ellos (complementos de fibra).  Beba suficiente lquido para mantener la orina clara o de color amarillo plido. Control del dolor y la hinchazn  Tome baos de asiento tibios durante 20 minutos, 3 o 4 veces por da para calmar el dolor y las molestias.  Si se lo indican, aplique hielo en la zona afectada. Usar compresas de hielo entre los baos de asiento puede ser efectivo. ? Ponga el hielo en una bolsa plstica. ? Coloque una toalla entre la piel y la bolsa de hielo. ? Coloque el hielo durante 20 minutos, 2 a 3 veces por da. Instrucciones generales  Tome los medicamentos de venta libre y los recetados solamente como se lo haya indicado el mdico.  Aplquese los   medicamentos, cremas o supositorios como se lo hayan indicado.  Haga ejercicios regularmente.  Vaya al bao cuando sienta la necesidad de defecar. No espere.  Evite hacer  fuerza al defecar.  Mantenga la zona anal limpia y seca. Use papel higinico hmedo o toallitas humedecidas despus de defecar.  No pase mucho tiempo sentado en el inodoro. Esto aumenta la afluencia de sangre y el dolor. SOLICITE ATENCIN MDICA SI:  Aumenta el dolor y la hinchazn, y no puede controlarlos con los medicamentos o con un tratamiento.  Tiene una hemorragia que no puede controlar.  No puede defecar o lo hace con dificultad.  Siente dolor o tiene inflamacin fuera de la zona de las hemorroides.  Esta informacin no tiene como fin reemplazar el consejo del mdico. Asegrese de hacerle al mdico cualquier pregunta que tenga. Document Released: 08/01/2005 Document Revised: 11/23/2015 Document Reviewed: 04/15/2015 Elsevier Interactive Patient Education  2017 Elsevier Inc.  

## 2017-07-26 NOTE — Progress Notes (Signed)
Subjective:  [name], female    DOB: [birth year]  Age: 56 y.o. MRN: [id number]  CC: f/u multiple complaints  HPI  Frances Martin a 56 y.o.femalewith a medical history of GERD presents on f/u of arthralgia, insomnia, and fatigue. Labs for ANA, ESR, and CRP were negative. Previous general labs normal. Says all her symptoms have improved on Vitamin D 5000 units and Naproxen. Did not fill Ambien and used Melatonin instead. Says she is sleeping better now. Still has stress due to the responsibility of raising her granddaughter secondary to daughter's deportation. However, she denies feeling depressed or anxious.    Main complaint today is of rectal bleeding attributed to hemorrhoids. Has noted significant bleeding, last episode two days ago. Reports large hemorrhoids. Admits to having poor water intake and constipation. Would like a referral for hemorrhoidectomy.     Outpatient Medications Prior to Visit  Medication Sig Dispense Refill  . Cholecalciferol (VITAMIN D3) 5000 units CAPS Take 1 capsule (5,000 Units total) daily by mouth. 30 capsule 0  . naproxen (NAPROSYN) 500 MG tablet Take 1 tablet (500 mg total) 2 (two) times daily with a meal by mouth. 30 tablet 0  . zolpidem (AMBIEN) 5 MG tablet Take 1 tablet (5 mg total) at bedtime as needed by mouth for sleep. (Patient not taking: Reported on 07/13/2017) 15 tablet 1   No facility-administered medications prior to visit.      ROS Review of Systems  Constitutional: Negative for chills, fever and malaise/fatigue.  Eyes: Negative for blurred vision.  Respiratory: Negative for shortness of breath.   Cardiovascular: Negative for chest pain and palpitations.  Gastrointestinal: Negative for abdominal pain and nausea.       Hemorrhoid  Genitourinary: Negative for dysuria and hematuria.  Musculoskeletal: Negative for joint pain and myalgias.  Skin: Negative for rash.  Neurological: Negative for tingling and  headaches.  Psychiatric/Behavioral: Negative for depression. The patient is not nervous/anxious.     Objective:  BP 121/80 (BP Location: Left Arm, Patient Position: Sitting, Cuff Size: Large)   Pulse 83   Temp 97.9 F (36.6 C) (Oral)   Wt 186 lb 12.8 oz (84.7 kg)   SpO2 95%   BMI 34.17 kg/m   BP/Weight 07/26/2017 07/13/2017 46/27/0350  Systolic BP 093 818 299  Diastolic BP 80 84 74  Wt. (Lbs) 186.8 188 201  BMI 34.17 34.39 36.76      Physical Exam  Constitutional: She is oriented to person, place, and time.  Well developed, well nourished, NAD, polite  HENT:  Head: Normocephalic and atraumatic.  Eyes: No scleral icterus.  Neck: Normal range of motion. Neck supple. No thyromegaly present.  Pulmonary/Chest: Effort normal.  Genitourinary:  Genitourinary Comments: Large, non-thrombosed, external hemorrhoids  Musculoskeletal: She exhibits no edema.  Neurological: She is alert and oriented to person, place, and time. No cranial nerve deficit. Coordination normal.  Skin: Skin is warm and dry. No rash noted. No erythema. No pallor.  Psychiatric: She has a normal mood and affect. Her behavior is normal. Thought content normal.  Vitals reviewed.    Assessment & Plan:   1. Arthralgia, unspecified joint - Labs do not support autoimmune or inflammatory disease. - Refill naproxen (NAPROSYN) 500 MG tablet; Take 1 tablet (500 mg total) by mouth 2 (two) times daily with a meal.  Dispense: 30 tablet; Refill: 0  2. Insomnia, unspecified type - Continue on Melatonin  3. Fatigue, unspecified type - Cholecalciferol (VITAMIN D3) 5000 units CAPS;  Take 1 capsule (5,000 Units total) by mouth daily.  Dispense: 30 capsule; Refill: 0  4. Grade IV hemorrhoids - Ambulatory referral to General Surgery  5. Constipation, unspecified constipation type - Begin docusate sodium (COLACE) 50 MG capsule; Take 1 capsule (50 mg total) by mouth 2 (two) times daily.  Dispense: 10 capsule; Refill: 2 -  Colonoscopy screening negative on 03/2017.    Meds ordered this encounter  Medications  . naproxen (NAPROSYN) 500 MG tablet    Sig: Take 1 tablet (500 mg total) by mouth 2 (two) times daily with a meal.    Dispense:  30 tablet    Refill:  0    Order Specific Question:   Supervising Provider    Answer:   Tresa Garter [id number]  . Cholecalciferol (VITAMIN D3) 5000 units CAPS    Sig: Take 1 capsule (5,000 Units total) by mouth daily.    Dispense:  30 capsule    Refill:  0    Order Specific Question:   Supervising Provider    Answer:   Tresa Garter [id number]  . docusate sodium (COLACE) 50 MG capsule    Sig: Take 1 capsule (50 mg total) by mouth 2 (two) times daily.    Dispense:  10 capsule    Refill:  2    Order Specific Question:   Supervising Provider    Answer:   Tresa Garter [id number]    Follow-up: Return if symptoms worsen or fail to improve.   Clent Demark PA

## 2017-08-21 ENCOUNTER — Encounter: Payer: Self-pay | Admitting: Surgery

## 2017-08-21 ENCOUNTER — Ambulatory Visit (INDEPENDENT_AMBULATORY_CARE_PROVIDER_SITE_OTHER): Payer: Self-pay | Admitting: Surgery

## 2017-08-21 VITALS — BP 144/78 | HR 78 | Temp 98.3°F | Ht 65.0 in | Wt 183.0 lb

## 2017-08-21 DIAGNOSIS — K649 Unspecified hemorrhoids: Secondary | ICD-10-CM | POA: Insufficient documentation

## 2017-08-21 NOTE — Progress Notes (Signed)
08/21/2017  Reason for Visit:  Hemorrhoids  Referring Provider:  Sindy Messingoger Gomez, PA-C  History of Present Illness: Kathlean Pollyann SamplesMartinez Anaya is a 57 y.o. female who presents as a referral for evaluation of hemorrhoids.  She reports that she has a history of a few years of hemorrhoids but over the past year she has noticed more issues with them particularly bleeding approximately once a month that lasts for about 2-3 days.  She has had issues with constipation in the past but currently feels that this has improved somewhat she is drinking more water and taking stool softeners.  However she still having issues with hemorrhoids and feels that this past year has been worse.  Denies having any fevers or chills, any abdominal pain.  She does report having constipation, and some episodes of flareups of the hemorrhoids that resolve on their own.  Denies having any abscess or purulent drainage.  She has not tried sitz bath's has not used any other medications.  Past Medical History: --Hemorrhoids --Vitamin D deficiency  Past Surgical History: Past Surgical History:  Procedure Laterality Date  . CESAREAN SECTION    . TUBAL LIGATION      Home Medications: Prior to Admission medications   Medication Sig Start Date End Date Taking? Authorizing Provider  Cholecalciferol (VITAMIN D3) 5000 units CAPS Take 1 capsule (5,000 Units total) by mouth daily. 07/26/17  Yes Loletta SpecterGomez, Roger David, PA-C  docusate sodium (COLACE) 50 MG capsule Take 1 capsule (50 mg total) by mouth 2 (two) times daily. 07/26/17  Yes Loletta SpecterGomez, Roger David, PA-C    Allergies: No Known Allergies  Social History:  reports that she has been smoking cigarettes.  she has never used smokeless tobacco. She reports that she does not drink alcohol or use drugs.   Family History: Family History  Problem Relation Age of Onset  . Diabetes Mother   . Hypertension Mother   . Diabetes Father     Review of Systems: Review of Systems  Constitutional:  Negative for chills and fever.  HENT: Negative for hearing loss.   Eyes: Negative for blurred vision.  Respiratory: Negative for shortness of breath.   Cardiovascular: Negative for chest pain.  Gastrointestinal: Positive for blood in stool and constipation. Negative for abdominal pain, nausea and vomiting.  Genitourinary: Negative for dysuria.  Musculoskeletal: Negative for myalgias.  Neurological: Negative for dizziness.  Psychiatric/Behavioral: Negative for depression.  All other systems reviewed and are negative.   Physical Exam BP (!) 144/78   Pulse 78   Temp 98.3 F (36.8 C) (Oral)   Ht 5\' 5"  (1.651 m)   Wt 83 kg (183 lb)   BMI 30.45 kg/m  CONSTITUTIONAL: No acute distress HEENT:  Normocephalic, atraumatic, extraocular motion intact. NECK: Trachea is midline, and there is no jugular venous distension.  RESPIRATORY:  Lungs are clear, and breath sounds are equal bilaterally. Normal respiratory effort without pathologic use of accessory muscles. CARDIOVASCULAR: Heart is regular without murmurs, gallops, or rubs. GI: The abdomen is soft, nondistended, nontender.  RECTAL: External exam reveals normal size external hemorrhoids with no areas of inflammation.  There is no external abscess.  Digital exam reveals mildly enlarged internal hemorrhoids particularly left lateral and right posterior columns.  However they are not significantly enlarged.  Overall there was no tenderness to palpation and no areas of inflammation.  There is no gross blood on the glove.  There were no fissures or lesions. MUSCULOSKELETAL:  Normal muscle strength and tone in all four extremities.  No peripheral edema or cyanosis. SKIN: Skin turgor is normal. There are no pathologic skin lesions.  NEUROLOGIC:  Motor and sensation is grossly normal.  Cranial nerves are grossly intact. PSYCH:  Alert and oriented to person, place and time. Affect is normal.  Laboratory Analysis: None recently  Imaging: None  recently  Assessment and Plan: This is a 57 y.o. female who presents as a referral for evaluation of hemorrhoids.    Discussed with the patient that currently there are no inflamed hemorrhoids.  Also does not appear to be a prolapsing hemorrhoidal tissue either.  Her external hemorrhoids are not significantly enlarged either.  Discussed with the patient conservative measures including taking MiraLAX once daily to improve her constipation, adding fiber to her diet such as Metamucil once daily to help soften her stools as well, doing sitz bath's as needed particularly during episodes of flareups, and during episodes of flareups also potentially do ointments or suppositories like Anusol.  At this point she only needs to do MiraLAX and fiber given that she has no flareups or bleeding.  She will come back in 2 months to assess her progress and see whether this is enough or whether she would require surgery.  She understands this plan and all of her questions have been answered  Face-to-face time spent with the patient and care providers was 60 minutes, with more than 50% of the time spent counseling, educating, and coordinating care of the patient.     Howie Ill, MD [facility]

## 2017-08-21 NOTE — Patient Instructions (Signed)
Por favor empieze a tomar Miralax 17 Gramos al dia con agua, cafe, o cualquier liquido.  Necesito que tome mas agua diariamente.  Necesito que tome ) o coma mas fibra en su alimento.  La vulevo a ver en 2 meses para ver come sigue.   Bao de asiento desechable (Disposable Sitz Bath) Un bao de asiento desechable es un recipiente plstico que se adapta al inodoro. Se cuelga una bolsa sobre el inodoro y se la conecta a un tubo que desemboca en el recipiente. La bolsa se llena con agua tibia que fluye hasta el recipiente a travs del tubo. Se puede usar un bao de asiento para Paramedic sntomas, higienizar y Public affairs consultant en las zonas genital y anal, as como en la parte baja del abdomen y los glteos. RIESGOS Y COMPLICACIONES Por lo general, los baos de asiento son muy seguros. La piel que se encuentra entre los genitales y el ano (perineo) se puede infectar, pero esto es poco frecuente. Para evitar que esto ocurra, limpie cuidadosamente los insumos del bao de [place]. CMO USAR UN BAO DE ASIENTO DESECHABLE 1. Cierre la abrazadera del tubo. Asegrese de que la abrazadera est bien cerrada para evitar las prdidas. 2. Llene el recipiente del bao de asiento y la bolsa plstica con agua tibia. El agua debe estar lo bastante tibia para que resulte agradable, pero no muy caliente. 3. Levante el asiento del inodoro y coloque el recipiente lleno sobre el inodoro. Asegrese de que la abertura del sobreflujo se enfrenta a la parte posterior del inodoro.  Si lo prefiere, Management consultant recipiente vaco sobre el inodoro y [employer] usar la bolsa plstica para llenar el recipiente con agua tibia. 1. Cuelgue la bolsa de plstico por encima de su cabeza de un gancho o toallero cerca del inodoro. La bolsa debe estar ms alta que el inodoro para que el agua descienda a travs del tubo. 2. Conecte el tubo a la abertura del recipiente. Asegrese de que el tubo est bien conectado al  recipiente para evitar prdidas. 3. Sintese sobre el recipiente y afloje la abrazadera. Esto permitir que el agua tibia fluya hasta el recipiente y limpie la zona que rodea a los genitales y al ano. 4. Permanezca sentado sobre el recipiente durante aproximadamente 15 a , o durante el tiempo que le haya indicado el mdico. 5. Pngase de pie y squese la piel con golpecitos suaves. Colquese vendas (vendajes) limpias en la zona afectada como se lo haya indicado el mdico. 6. Retire el recipiente con cuidado del asiento del inodoro y vace el recipiente en el inodoro para eliminar cualquier resto de agua. Vace cualquier resto de agua de la bolsa plstica en el inodoro. Luego, tire de Medical laboratory scientific officer. 7. Luego lvelo con agua tibia y jabn. Deje secar el recipiente al aire en el lavabo. Tambin debe dejar secar al aire la bolsa plstica y el tubo. 8. Guarde el recipiente, el tubo y la bolsa plstica en un lugar limpio y Dealer. 9. Lvese las manos con agua y [place]. Use desinfectante para manos si no dispone de [place] y [place]. SOLICITE ATENCIN MDICA SI:  Los sntomas Building services engineer de mejorar.  Le aparece Yaakov Guthrie irritacin en la piel, enrojecimiento o hinchazn alrededor de los genitales y del ano. Esta informacin no tiene Theme park manager el consejo del mdico. Asegrese de hacerle al mdico cualquier pregunta que tenga. Document Released: 04/25/2012 Document Revised: 11/23/2015 Document Reviewed: 06/21/2015 Elsevier Interactive Patient Education  2018 Elsevier Inc.

## 2017-09-25 ENCOUNTER — Ambulatory Visit: Payer: No Typology Code available for payment source | Attending: Physician Assistant

## 2017-10-19 ENCOUNTER — Encounter: Payer: Self-pay | Admitting: Surgery

## 2017-10-19 ENCOUNTER — Ambulatory Visit (INDEPENDENT_AMBULATORY_CARE_PROVIDER_SITE_OTHER): Payer: Self-pay | Admitting: Surgery

## 2017-10-19 VITALS — BP 140/92 | HR 80 | Temp 98.3°F | Wt 195.0 lb

## 2017-10-19 DIAGNOSIS — K649 Unspecified hemorrhoids: Secondary | ICD-10-CM

## 2017-10-19 NOTE — Patient Instructions (Signed)
Por favor vea su papel azul sobre su [place].

## 2017-10-19 NOTE — Progress Notes (Signed)
  10/19/2017  History of Present Illness: Frances Martin is a 57 y.o. female who presents for follow up on hemorhoids.  She was seen two months ago and was started on conservative management with MiraLax, anusol, sitz baths.  She has had some relief with Miralax but still has discomfort and episodes of bleeding.  She had an episode yesterday.  She reports that she's worried that she's going to have bleeding while she's out and have an accident.  This is affecting her lifestyle   Past Medical History: Past Medical History:  Diagnosis Date  . Hemorrhoids      Past Surgical History: Past Surgical History:  Procedure Laterality Date  . CESAREAN SECTION    . TUBAL LIGATION      Home Medications: Prior to Admission medications   Medication Sig Start Date End Date Taking? Authorizing Provider  Cholecalciferol (VITAMIN D3) 5000 units CAPS Take 1 capsule (5,000 Units total) by mouth daily. 07/26/17  Yes Loletta SpecterGomez, Roger David, PA-C  docusate sodium (COLACE) 50 MG capsule Take 1 capsule (50 mg total) by mouth 2 (two) times daily. 07/26/17  Yes Loletta SpecterGomez, Roger David, PA-C    Allergies: No Known Allergies  Review of Systems: Review of Systems  Constitutional: Negative for chills and fever.  Respiratory: Negative for shortness of breath.   Cardiovascular: Negative for chest pain.  Gastrointestinal: Positive for blood in stool. Negative for abdominal pain, constipation, nausea and vomiting.    Physical Exam BP (!) 140/92   Pulse 80   Temp 98.3 F (36.8 C) (Oral)   Wt 88.5 kg (195 lb)   BMI 32.45 kg/m  CONSTITUTIONAL: No acute distress RESPIRATORY:  Lungs are clear, and breath sounds are equal bilaterally. Normal respiratory effort without pathologic use of accessory muscles. CARDIOVASCULAR: Heart is regular without murmurs, gallops, or rubs. GI: The abdomen is soft, nondistended, nontender to palpation.  RECTAL:  External hemorrhoids, non-inflamed or thrombosed.  On rectal exam, the  patient has two enlarge hemorrhoidal columns.  Normal rectal tone, no fissures or lesions. MUSCULOSKELETAL:  Normal muscle strength and tone in all four extremities.  No peripheral edema or cyanosis. SKIN: Skin turgor is normal. There are no pathologic skin lesions.  NEUROLOGIC:  Motor and sensation is grossly normal.  Cranial nerves are grossly intact. PSYCH:  Alert and oriented to person, place and time. Affect is normal.  Labs/Imaging: None recently  Assessment and Plan: This is a 57 y.o. female who presents for follow up on bleeding hemorrhoids.  Discussed with the patient that given the persistent symptoms despite of two months of conservative management, would proceed to surgery instead.  Discussed the planned procedure for exam under anesthesia and hemorrhoidectomy, likely of two columns.  Discussed the risk of bleeding, infection, and injury to surrounding structures, particularly the sphincter muscles.  She is willing to proceed.  She'll be scheduled for April 2nd.  Patient understands this plan and all of her questions have been answered.  Face-to-face time spent with the patient and care providers was 25 minutes, with more than 50% of the time spent counseling, educating, and coordinating care of the patient.     Howie IllJose Luis Cadyn Rodger, MD [facility]

## 2017-10-20 ENCOUNTER — Telehealth: Payer: Self-pay

## 2017-10-20 NOTE — Telephone Encounter (Signed)
Gastroenterology Pre-Procedure Review  Request Date: 10/31/17 Requesting Physician: Dr. Tobi BastosAnna    PATIENT REVIEW QUESTIONS: The patient responded to the following health history questions as indicated:    1. Are you having any GI issues? Just bleeding hemorrhoids. Surgery scheduled for 11/14/17 with Dr Aleen CampiPiscoya   2. Do you have a personal history of Polyps? No  3. Do you have a family history of Colon Cancer or Polyps? No  4. Diabetes Mellitus? No  5. Joint replacements in the past 12 months? No  6. Major health problems in the past 3 months? No  7. Any artificial heart valves, MVP, or defibrillator? No     MEDICATIONS & ALLERGIES:    Patient reports the following regarding taking any anticoagulation/antiplatelet therapy:   Plavix, Coumadin, Eliquis, Xarelto, Lovenox, Pradaxa, Brilinta, or Effient? No  Aspirin? Yes, 81 mg   Patient confirms/reports the following medications:  Current Outpatient Medications  Medication Sig Dispense Refill  . aspirin EC 81 MG tablet Take 81 mg by mouth daily.    . Cholecalciferol (VITAMIN D3) 5000 units CAPS Take 1 capsule (5,000 Units total) by mouth daily. 30 capsule 0   No current facility-administered medications for this visit.     Patient confirms/reports the following allergies:  No Known Allergies  No orders of the defined types were placed in this encounter.   AUTHORIZATION INFORMATION Primary Insurance: 1D#: Group #:  Secondary Insurance: 1D#: Group #:  SCHEDULE INFORMATION: Date: 10/31/17 Time: Location: [facility]

## 2017-10-24 ENCOUNTER — Telehealth: Payer: Self-pay | Admitting: Surgery

## 2017-10-24 NOTE — Telephone Encounter (Signed)
Pt advised of pre op date/time and sx date. Sx: 11/13/17 with Dr Albertina SenegalPiscoya-EUA with hemorrhoidectomy.  Pre op: 11/06/17 @ 8:00am--office interview.   Patient made aware to call [phone], between 1-3:00pm the day before surgery, to find out what time to arrive.

## 2017-10-26 ENCOUNTER — Telehealth: Payer: Self-pay

## 2017-10-26 NOTE — Telephone Encounter (Signed)
Patient called and cancelled her surgery. She stated that she is a very nervous lady and that she will not be able to control her nerves. I told her that if she every decided on doing her surgery, to please give us a call. Patient agreed and had no further questions.

## 2017-10-27 ENCOUNTER — Telehealth: Payer: Self-pay

## 2017-10-27 NOTE — Telephone Encounter (Signed)
Called patient but had to leave her a voicemail to call me back.  Patient needs a one month follow up with Dr. Aleen CampiPiscoya.

## 2017-10-29 ENCOUNTER — Other Ambulatory Visit: Payer: Self-pay

## 2017-10-29 DIAGNOSIS — K625 Hemorrhage of anus and rectum: Secondary | ICD-10-CM

## 2017-10-29 MED ORDER — NA SULFATE-K SULFATE-MG SULF 17.5-3.13-1.6 GM/177ML PO SOLN: 1.0000 | Freq: Once | ORAL | 0 refills | Status: AC

## 2017-10-30 ENCOUNTER — Other Ambulatory Visit: Payer: Self-pay

## 2017-10-30 ENCOUNTER — Telehealth: Payer: Self-pay

## 2017-10-30 MED ORDER — NA SULFATE-K SULFATE-MG SULF 17.5-3.13-1.6 GM/177ML PO SOLN: 1.0000 | Freq: Once | ORAL | 0 refills | Status: AC

## 2017-10-30 NOTE — Telephone Encounter (Signed)
Colonoscopy has been rescheduled because patient was not able to get her colonoscopy instructions in time to prep for tomorrow.  Her colonoscopy has been moved to 11/14/17.  Prep sent to pharmacy.  Will mail patient instructions to her in Spanish.  Thanks [employer] Southern FinancialMichelle

## 2017-10-30 NOTE — Telephone Encounter (Signed)
Patient called me back and she stated that she agreed in coming back to see Dr. Aleen Martin in a month from her last visit. Her appointment is on 12/04/2017. Patient understood and had no further questions.

## 2017-11-06 ENCOUNTER — Other Ambulatory Visit: Payer: Self-pay

## 2017-11-06 ENCOUNTER — Other Ambulatory Visit: Payer: No Typology Code available for payment source

## 2017-11-06 DIAGNOSIS — Z1211 Encounter for screening for malignant neoplasm of colon: Secondary | ICD-10-CM

## 2017-11-06 MED ORDER — PEG 3350-KCL-NA BICARB-NACL 420 G PO SOLR: 4000.0000 mL | Freq: Once | ORAL | 0 refills | Status: DC

## 2017-11-06 MED ORDER — PEG 3350-KCL-NA BICARB-NACL 420 G PO SOLR: 4000.0000 mL | Freq: Once | ORAL | 0 refills | Status: AC

## 2017-11-13 ENCOUNTER — Ambulatory Visit: Admit: 2017-11-13 | Payer: No Typology Code available for payment source | Admitting: Surgery

## 2017-11-13 ENCOUNTER — Encounter: Payer: Self-pay | Admitting: Emergency Medicine

## 2017-11-13 SURGERY — EXAM UNDER ANESTHESIA WITH HEMORRHOIDECTOMY
Anesthesia: General

## 2017-11-14 ENCOUNTER — Ambulatory Visit: Payer: Self-pay | Admitting: Certified Registered Nurse Anesthetist

## 2017-11-14 ENCOUNTER — Encounter
Admission: RE | Disposition: A | Discharge status: Home / Self Care | Payer: Self-pay | Source: Ambulatory Visit | Attending: Gastroenterology

## 2017-11-14 ENCOUNTER — Ambulatory Visit
Admission: RE | Admit: 2017-11-14 | Discharge: 2017-11-14 | Disposition: A | Discharge status: Home / Self Care | Payer: Self-pay | Source: Ambulatory Visit | Attending: Gastroenterology | Admitting: Gastroenterology

## 2017-11-14 DIAGNOSIS — Z7982 Long term (current) use of aspirin: Secondary | ICD-10-CM | POA: Insufficient documentation

## 2017-11-14 DIAGNOSIS — K5730 Diverticulosis of large intestine without perforation or abscess without bleeding: Secondary | ICD-10-CM | POA: Insufficient documentation

## 2017-11-14 DIAGNOSIS — K640 First degree hemorrhoids: Secondary | ICD-10-CM | POA: Insufficient documentation

## 2017-11-14 DIAGNOSIS — K625 Hemorrhage of anus and rectum: Secondary | ICD-10-CM | POA: Insufficient documentation

## 2017-11-14 DIAGNOSIS — K219 Gastro-esophageal reflux disease without esophagitis: Secondary | ICD-10-CM | POA: Insufficient documentation

## 2017-11-14 DIAGNOSIS — F17210 Nicotine dependence, cigarettes, uncomplicated: Secondary | ICD-10-CM | POA: Insufficient documentation

## 2017-11-14 DIAGNOSIS — Z79899 Other long term (current) drug therapy: Secondary | ICD-10-CM | POA: Insufficient documentation

## 2017-11-14 HISTORY — PX: COLONOSCOPY WITH PROPOFOL: SHX5780

## 2017-11-14 SURGERY — COLONOSCOPY WITH PROPOFOL
Anesthesia: General

## 2017-11-14 MED ORDER — SODIUM CHLORIDE 0.9 % IV SOLN
INTRAVENOUS | Status: DC
  Administered 2017-11-14: 1000 mL via INTRAVENOUS

## 2017-11-14 MED ORDER — LIDOCAINE HCL (PF) 1 % IJ SOLN
INTRAMUSCULAR | Status: AC
  Administered 2017-11-14: 0.3 mL
  Filled 2017-11-14: qty 2

## 2017-11-14 MED ORDER — LIDOCAINE HCL (CARDIAC) 20 MG/ML IV SOLN
INTRAVENOUS | Status: DC | PRN
  Administered 2017-11-14: 50 mg via INTRAVENOUS

## 2017-11-14 MED ORDER — LIDOCAINE HCL (PF) 2 % IJ SOLN
INTRAMUSCULAR | Status: AC
Start: 2017-11-14 — End: ?
  Filled 2017-11-14: qty 10

## 2017-11-14 MED ORDER — PROPOFOL 10 MG/ML IV BOLUS
INTRAVENOUS | Status: DC | PRN
  Administered 2017-11-14 (×2): 50 mg via INTRAVENOUS
  Administered 2017-11-14: 20 mg via INTRAVENOUS

## 2017-11-14 MED ORDER — PROPOFOL 10 MG/ML IV BOLUS
INTRAVENOUS | Status: AC
  Filled 2017-11-14: qty 40

## 2017-11-14 NOTE — Anesthesia Procedure Notes (Signed)
Procedure Name: MAC Date/Time: 11/14/2017 8:36 AM Performed by: Rudean Hitt, CRNA Pre-anesthesia Checklist: Patient identified, Emergency Drugs available, Suction available, Patient being monitored and Timeout performed Patient Re-evaluated:Patient Re-evaluated prior to induction Oxygen Delivery Method: Nasal cannula Induction Type: IV induction

## 2017-11-14 NOTE — Op Note (Signed)
[facility] Gastroenterology Patient Name: Frances Martin Procedure Date: 11/14/2017 8:32 AM MRN: [id number] Account #: [id number] Date of Birth: 1961/02/26 Admit Type: Outpatient Age: 57 Room[facility] ROOM 1 Gender: Female Note Status: Finalized Procedure:            Colonoscopy Indications:          Rectal bleeding Providers:            Wyline MoodKiran Jesicca Dipierro MD, MD Referring MD:         Loletta Specteroger David Gomez (Referring MD) Medicines:            Monitored Anesthesia Care Complications:        No immediate complications. Procedure:            Pre-Anesthesia Assessment:                       - Prior to the procedure, a History and Physical was                        performed, and patient medications, allergies and                        sensitivities were reviewed. The patient's tolerance of                        previous anesthesia was reviewed.                       - After reviewing the risks and benefits, the patient                        was deemed in satisfactory condition to undergo the                        procedure.                       - Prior to the procedure, a History and Physical was                        performed, and patient medications, allergies and                        sensitivities were reviewed. The patient's tolerance of                        previous anesthesia was reviewed.                       - The risks and benefits of the procedure and the                        sedation options and risks were discussed with the                        patient. All questions were answered and informed                        consent was obtained.                       - ASA  Grade Assessment: II - A patient with mild                        systemic disease.                       After obtaining informed consent, the colonoscope was                        passed under direct vision. Throughout the procedure,                        the patient's blood  pressure, pulse, and oxygen                        saturations were monitored continuously. The                        Colonoscope was introduced through the anus and                        advanced to the the cecum, identified by the                        appendiceal orifice, IC valve and transillumination.                        The colonoscopy was performed with ease. The patient                        tolerated the procedure well. The quality of the bowel                        preparation was poor. Findings:      The perianal and digital rectal examinations were normal.      Non-bleeding internal hemorrhoids were found during retroflexion. The       hemorrhoids were large and Grade I (internal hemorrhoids that do not       prolapse).      Multiple small-mouthed diverticula were found in the sigmoid colon.      A large amount of semi-solid stool was found in the entire colon. Impression:           - Preparation of the colon was poor.                       - Non-bleeding internal hemorrhoids.                       - Diverticulosis in the sigmoid colon.                       - Stool in the entire examined colon.                       - No specimens collected. Recommendation:       - Discharge patient to home (with escort).                       - Resume previous diet.                       -  Continue present medications.                       - Repeat colonoscopy in 2 weeks because the bowel                        preparation was suboptimal. Procedure Code(s):    --- Professional ---                       [phone], Colonoscopy, flexible; diagnostic, including                        collection of specimen(s) by brushing or washing, when                        performed (separate procedure) Diagnosis Code(s):    --- Professional ---                       K64.0, First degree hemorrhoids                       K62.5, Hemorrhage of anus and rectum                       K57.30,  Diverticulosis of large intestine without                        perforation or abscess without bleeding CPT copyright 2017 American Medical Association. All rights reserved. The codes documented in this report are preliminary and upon coder review may  be revised to meet current compliance requirements. [name], MD [name] MD, MD 11/14/2017 8:44:45 AM This report has been signed electronically. Number of Addenda: 0 Note Initiated On: 11/14/2017 8:32 AM Scope Withdrawal Time: 0 hours 2 minutes 31 seconds  Total Procedure Duration: 0 hours 4 minutes 43 seconds       [facility]

## 2017-11-14 NOTE — Anesthesia Preprocedure Evaluation (Signed)
Anesthesia Evaluation  Patient identified by MRN, date of birth, ID band Patient awake    Reviewed: Allergy & Precautions, NPO status , Patient's Chart, lab work & pertinent test results  History of Anesthesia Complications Negative for: history of anesthetic complications  Airway Mallampati: II       Dental   Pulmonary neg sleep apnea, neg COPD, Current Smoker,           Cardiovascular (-) hypertension(-) Past MI and (-) CHF (-) dysrhythmias (-) Valvular Problems/Murmurs     Neuro/Psych neg Seizures    GI/Hepatic Neg liver ROS, GERD  Medicated,  Endo/Other  neg diabetes  Renal/GU negative Renal ROS     Musculoskeletal   Abdominal   Peds  Hematology   Anesthesia Other Findings   Reproductive/Obstetrics                             Anesthesia Physical Anesthesia Plan  ASA: II  Anesthesia Plan: General   Post-op Pain Management:    Induction: Intravenous  PONV Risk Score and Plan: Propofol infusion and TIVA  Airway Management Planned: Nasal Cannula  Additional Equipment:   Intra-op Plan:   Post-operative Plan:   Informed Consent: I have reviewed the patients History and Physical, chart, labs and discussed the procedure including the risks, benefits and alternatives for the proposed anesthesia with the patient or authorized representative who has indicated his/her understanding and acceptance.     Plan Discussed with:   Anesthesia Plan Comments:         Anesthesia Quick Evaluation

## 2017-11-14 NOTE — H&P (Signed)
Wyline MoodKiran Faren Florence, MD [address], [address], [place], [place], [address] [address], [address], [place], [place], [address] Phone: [phone]  Fax: [phone]  Primary Care Physician:  Loletta SpecterGomez, Roger David, PA-C   Pre-Procedure History & Physical: HPI:  Frances SoleSusana Pollyann SamplesMartinez Martin is a 57 y.o. female is here for an colonoscopy.   Past Medical History:  Diagnosis Date  . Hemorrhoids     Past Surgical History:  Procedure Laterality Date  . CESAREAN SECTION    . TUBAL LIGATION      Prior to Admission medications   Medication Sig Start Date End Date Taking? Authorizing Provider  aspirin EC 81 MG tablet Take 81 mg by mouth daily.    [provider]  Cholecalciferol (VITAMIN D3) 5000 units CAPS Take 1 capsule (5,000 Units total) by mouth daily. 07/26/17   Loletta SpecterGomez, Roger David, PA-C    Allergies as of 10/30/2017  . (No Known Allergies)    Family History  Problem Relation Age of Onset  . Diabetes Mother   . Hypertension Mother   . Diabetes Father     Social History   Socioeconomic History  . Marital status: Married    Spouse name: Not on file  . Number of children: Not on file  . Years of education: Not on file  . Highest education level: Not on file  Occupational History  . Not on file  Social Needs  . Financial resource strain: Not on file  . Food insecurity:    Worry: Not on file    Inability: Not on file  . Transportation needs:    Medical: Not on file    Non-medical: Not on file  Tobacco Use  . Smoking status: Current Every Day Smoker    Types: Cigarettes  . Smokeless tobacco: Never Used  . Tobacco comment: 3 a day  Substance and Sexual Activity  . Alcohol use: No    Frequency: Never  . Drug use: No  . Sexual activity: Yes  Lifestyle  . Physical activity:    Days per week: 7 days    Minutes per session: 90 min  . Stress: Very much  Relationships  . Social connections:    Talks on phone: Never    Gets together: Once a week    Attends  religious service: More than 4 times per year    Active member of club or organization: No    Attends meetings of clubs or organizations: Never    Relationship status: Married  . Intimate partner violence:    Fear of current or ex partner: No    Emotionally abused: No    Physically abused: No    Forced sexual activity: No  Other Topics Concern  . Not on file  Social History Narrative  . Not on file    Review of Systems: See HPI, otherwise negative ROS  Physical Exam: BP (!) 156/85   Pulse 78   Temp (!) 97.1 F (36.2 C) (Tympanic)   Resp 17   Ht 5\' 3"  (1.6 m)   Wt 190 lb (86.2 kg)   SpO2 99%   BMI 33.66 kg/m  General:   Alert,  pleasant and cooperative in NAD Head:  Normocephalic and atraumatic. Neck:  Supple; no masses or thyromegaly. Lungs:  Clear throughout to auscultation, normal respiratory effort.    Heart:  +S1, +S2, Regular rate and rhythm, No edema. Abdomen:  Soft, nontender and nondistended. Normal bowel sounds, without guarding, and without rebound.  Neurologic:  Alert and  oriented x4;  grossly normal neurologically.  Impression/Plan: Frances Martin is here for an colonoscopy to be performed for rectal bleeding  Risks, benefits, limitations, and alternatives regarding  colonoscopy have been reviewed with the patient.  Questions have been answered.  All parties agreeable.   [name], MD  11/14/2017, 8:16 AM

## 2017-11-14 NOTE — Anesthesia Post-op Follow-up Note (Signed)
Anesthesia QCDR form completed.        

## 2017-11-14 NOTE — Anesthesia Postprocedure Evaluation (Signed)
Anesthesia Post Note  Patient: Frances BullsSusana Martinez Martin  Procedure(s) Performed: COLONOSCOPY WITH PROPOFOL (N/A )  Patient location during evaluation: PACU Anesthesia Type: General Level of consciousness: awake and alert Pain management: pain level controlled Vital Signs Assessment: post-procedure vital signs reviewed and stable Respiratory status: spontaneous breathing and respiratory function stable Cardiovascular status: stable Anesthetic complications: no     Last Vitals:  Vitals:   11/14/17 0920 11/14/17 0930  BP: 128/77 126/72  Pulse: 70 67  Resp: 15 (!) 30  Temp:    SpO2: 95% 98%    Last Pain:  Vitals:   11/14/17 0849  TempSrc: Tympanic  PainSc:                  Ival Basquez K

## 2017-11-14 NOTE — Transfer of Care (Signed)
Immediate Anesthesia Transfer of Care Note  Patient: Frances Martin  Procedure(s) Performed: COLONOSCOPY WITH PROPOFOL (N/A )  Patient Location: PACU  Anesthesia Type:General  Level of Consciousness: drowsy  Airway & Oxygen Therapy: Patient Spontanous Breathing and Patient connected to nasal cannula oxygen  Post-op Assessment: Report given to RN and Post -op Vital signs reviewed and stable  Post vital signs: Reviewed and stable  Last Vitals:  Vitals Value Taken Time  BP 105/72 11/14/2017  8:48 AM  Temp    Pulse 72 11/14/2017  8:48 AM  Resp 16 11/14/2017  8:48 AM  SpO2 98 % 11/14/2017  8:48 AM  Vitals shown include unvalidated device data.  Last Pain:  Vitals:   11/14/17 0804  TempSrc: Tympanic  PainSc: 0-No pain         Complications: No apparent anesthesia complications

## 2017-11-16 ENCOUNTER — Encounter: Payer: Self-pay | Admitting: Gastroenterology

## 2017-12-04 ENCOUNTER — Ambulatory Visit: Payer: Self-pay | Admitting: Surgery

## 2017-12-21 ENCOUNTER — Telehealth: Payer: Self-pay

## 2017-12-21 NOTE — Telephone Encounter (Signed)
Spanish interpreter LVM for patient to callback office for reschedule of colonoscopy.

## 2017-12-21 NOTE — Telephone Encounter (Signed)
-----   Message from [name], MD sent at 11/14/2017  8:50 AM EDT ----- Regarding: please arrange appointment   Keirstan Iannello,  Please arrange repeat colonocopy poor prep- didn't follow ibstructions    Regards    Dr [name]  Gastroenterology/Hepatology Pager: [phone]

## 2018-01-01 ENCOUNTER — Other Ambulatory Visit: Payer: Self-pay

## 2018-01-01 DIAGNOSIS — K649 Unspecified hemorrhoids: Secondary | ICD-10-CM

## 2018-01-01 DIAGNOSIS — K5730 Diverticulosis of large intestine without perforation or abscess without bleeding: Secondary | ICD-10-CM

## 2018-01-01 MED ORDER — PEG 3350-KCL-NABCB-NACL-NASULF 236 G PO SOLR: 4000.0000 mL | Freq: Once | ORAL | 0 refills | Status: AC

## 2018-01-01 NOTE — Progress Notes (Signed)
Contacted patient with interpreter.  She states she is clear on the preparation instructions.   Sent Rx to pharmacy.

## 2018-01-05 ENCOUNTER — Ambulatory Visit: Payer: Self-pay | Attending: Physician Assistant

## 2018-01-18 ENCOUNTER — Telehealth (INDEPENDENT_AMBULATORY_CARE_PROVIDER_SITE_OTHER): Payer: Self-pay | Admitting: Physician Assistant

## 2018-01-18 NOTE — Telephone Encounter (Signed)
Pt called to request an update on her CAFA for a scheduled colonoscopy.Please follow up

## 2018-01-19 ENCOUNTER — Encounter: Payer: Self-pay | Admitting: *Deleted

## 2018-01-22 ENCOUNTER — Ambulatory Visit: Payer: Self-pay | Admitting: Certified Registered Nurse Anesthetist

## 2018-01-22 ENCOUNTER — Encounter: Payer: Self-pay | Admitting: Anesthesiology

## 2018-01-22 ENCOUNTER — Encounter
Admission: RE | Disposition: A | Discharge status: Home / Self Care | Payer: Self-pay | Source: Ambulatory Visit | Attending: Gastroenterology

## 2018-01-22 ENCOUNTER — Ambulatory Visit
Admission: RE | Admit: 2018-01-22 | Discharge: 2018-01-22 | Disposition: A | Discharge status: Home / Self Care | Payer: Self-pay | Source: Ambulatory Visit | Attending: Gastroenterology | Admitting: Gastroenterology

## 2018-01-22 DIAGNOSIS — K649 Unspecified hemorrhoids: Secondary | ICD-10-CM

## 2018-01-22 DIAGNOSIS — Z833 Family history of diabetes mellitus: Secondary | ICD-10-CM | POA: Insufficient documentation

## 2018-01-22 DIAGNOSIS — K5731 Diverticulosis of large intestine without perforation or abscess with bleeding: Secondary | ICD-10-CM | POA: Insufficient documentation

## 2018-01-22 DIAGNOSIS — Z79899 Other long term (current) drug therapy: Secondary | ICD-10-CM | POA: Insufficient documentation

## 2018-01-22 DIAGNOSIS — K5730 Diverticulosis of large intestine without perforation or abscess without bleeding: Secondary | ICD-10-CM

## 2018-01-22 DIAGNOSIS — F17210 Nicotine dependence, cigarettes, uncomplicated: Secondary | ICD-10-CM | POA: Insufficient documentation

## 2018-01-22 DIAGNOSIS — K625 Hemorrhage of anus and rectum: Secondary | ICD-10-CM

## 2018-01-22 DIAGNOSIS — K640 First degree hemorrhoids: Secondary | ICD-10-CM | POA: Insufficient documentation

## 2018-01-22 DIAGNOSIS — Z8249 Family history of ischemic heart disease and other diseases of the circulatory system: Secondary | ICD-10-CM | POA: Insufficient documentation

## 2018-01-22 DIAGNOSIS — Z7982 Long term (current) use of aspirin: Secondary | ICD-10-CM | POA: Insufficient documentation

## 2018-01-22 HISTORY — PX: COLONOSCOPY WITH PROPOFOL: SHX5780

## 2018-01-22 SURGERY — COLONOSCOPY WITH PROPOFOL
Anesthesia: General

## 2018-01-22 MED ORDER — PROPOFOL 500 MG/50ML IV EMUL
INTRAVENOUS | Status: DC | PRN
  Administered 2018-01-22: 180 ug/kg/min via INTRAVENOUS

## 2018-01-22 MED ORDER — LIDOCAINE HCL (PF) 2 % IJ SOLN
INTRAMUSCULAR | Status: AC
  Filled 2018-01-22: qty 10

## 2018-01-22 MED ORDER — PROPOFOL 500 MG/50ML IV EMUL
INTRAVENOUS | Status: AC
  Filled 2018-01-22: qty 50

## 2018-01-22 MED ORDER — LIDOCAINE HCL (CARDIAC) PF 100 MG/5ML IV SOSY
PREFILLED_SYRINGE | INTRAVENOUS | Status: DC | PRN
  Administered 2018-01-22: 50 mg via INTRAVENOUS

## 2018-01-22 MED ORDER — PROPOFOL 10 MG/ML IV BOLUS
INTRAVENOUS | Status: DC | PRN
  Administered 2018-01-22: 50 mg via INTRAVENOUS

## 2018-01-22 MED ORDER — SODIUM CHLORIDE 0.9 % IV SOLN
INTRAVENOUS | Status: DC
  Administered 2018-01-22: 10:00:00 via INTRAVENOUS

## 2018-01-22 NOTE — Anesthesia Preprocedure Evaluation (Signed)
Anesthesia Evaluation  Patient identified by MRN, date of birth, ID band Patient awake    Reviewed: Allergy & Precautions, NPO status , Patient's Chart, lab work & pertinent test results  History of Anesthesia Complications Negative for: history of anesthetic complications  Airway Mallampati: II       Dental  (+) Dental Advidsory Given   Pulmonary neg sleep apnea, neg COPD, Current Smoker,           Cardiovascular (-) hypertension(-) Past MI and (-) CHF (-) dysrhythmias (-) Valvular Problems/Murmurs     Neuro/Psych neg Seizures    GI/Hepatic Neg liver ROS, GERD  Medicated,  Endo/Other  neg diabetes  Renal/GU negative Renal ROS     Musculoskeletal   Abdominal   Peds  Hematology   Anesthesia Other Findings Past Medical History: No date: Hemorrhoids   Reproductive/Obstetrics                             Anesthesia Physical  Anesthesia Plan  ASA: II  Anesthesia Plan: General   Post-op Pain Management:    Induction: Intravenous  PONV Risk Score and Plan: 2 and Propofol infusion and TIVA  Airway Management Planned: Nasal Cannula  Additional Equipment:   Intra-op Plan:   Post-operative Plan:   Informed Consent: I have reviewed the patients History and Physical, chart, labs and discussed the procedure including the risks, benefits and alternatives for the proposed anesthesia with the patient or authorized representative who has indicated his/her understanding and acceptance.     Plan Discussed with:   Anesthesia Plan Comments:         Anesthesia Quick Evaluation

## 2018-01-22 NOTE — H&P (Signed)
Wyline MoodKiran Bennetta Rudden, MD [address], [address], [place], [place], [address] [address], [address], [place], [place], [address] Phone: [phone]  Fax: [phone]  Primary Care Physician:  Loletta SpecterGomez, Roger David, PA-C   Pre-Procedure History & Physical: HPI:  Frances Martin is a 57 y.o. female is here for an colonoscopy.   Past Medical History:  Diagnosis Date  . Hemorrhoids     Past Surgical History:  Procedure Laterality Date  . CESAREAN SECTION    . COLONOSCOPY WITH PROPOFOL N/A 11/14/2017   Procedure: COLONOSCOPY WITH PROPOFOL;  Surgeon: Wyline MoodAnna, Froilan Mclean, MD;  [facility];  Service: Gastroenterology;  Laterality: N/A;  . TUBAL LIGATION      Prior to Admission medications   Medication Sig Start Date End Date Taking? Authorizing Provider  aspirin EC 81 MG tablet Take 81 mg by mouth daily.    [provider]  Cholecalciferol (VITAMIN D3) 5000 units CAPS Take 1 capsule (5,000 Units total) by mouth daily. 07/26/17   Loletta SpecterGomez, Roger David, PA-C    Allergies as of 01/01/2018  . (No Known Allergies)    Family History  Problem Relation Age of Onset  . Diabetes Mother   . Hypertension Mother   . Diabetes Father     Social History   Socioeconomic History  . Marital status: Married    Spouse name: Not on file  . Number of children: Not on file  . Years of education: Not on file  . Highest education level: Not on file  Occupational History  . Not on file  Social Needs  . Financial resource strain: Not on file  . Food insecurity:    Worry: Not on file    Inability: Not on file  . Transportation needs:    Medical: Not on file    Non-medical: Not on file  Tobacco Use  . Smoking status: Current Every Day Smoker    Types: Cigarettes  . Smokeless tobacco: Never Used  . Tobacco comment: 3 a day  Substance and Sexual Activity  . Alcohol use: No    Frequency: Never  . Drug use: No  . Sexual activity: Yes  Lifestyle  . Physical activity:    Days  per week: 7 days    Minutes per session: 90 min  . Stress: Very much  Relationships  . Social connections:    Talks on phone: Never    Gets together: Once a week    Attends religious service: More than 4 times per year    Active member of club or organization: No    Attends meetings of clubs or organizations: Never    Relationship status: Married  . Intimate partner violence:    Fear of current or ex partner: No    Emotionally abused: No    Physically abused: No    Forced sexual activity: No  Other Topics Concern  . Not on file  Social History Narrative  . Not on file    Review of Systems: See HPI, otherwise negative ROS  Physical Exam: BP 135/84   Pulse 73   Temp (!) 97.1 F (36.2 C) (Tympanic)   Resp 16   Ht 5\' 5"  (1.651 m)   Wt 190 lb (86.2 kg)   SpO2 100%   BMI 31.62 kg/m  General:   Alert,  pleasant and cooperative in NAD Head:  Normocephalic and atraumatic. Neck:  Supple; no masses or thyromegaly. Lungs:  Clear throughout to auscultation, normal respiratory effort.  Heart:  +S1, +S2, Regular rate and rhythm, No edema. Abdomen:  Soft, nontender and nondistended. Normal bowel sounds, without guarding, and without rebound.   Neurologic:  Alert and  oriented x4;  grossly normal neurologically.  Impression/Plan: Frances Martin is here for an colonoscopy to be performed for rectal bleeding.  Risks, benefits, limitations, and alternatives regarding  colonoscopy have been reviewed with the patient.  Questions have been answered.  All parties agreeable.   [name], MD  01/22/2018, 10:02 AM

## 2018-01-22 NOTE — Transfer of Care (Signed)
Immediate Anesthesia Transfer of Care Note  Patient: Vianne BullsSusana Martinez Anaya  Procedure(s) Performed: COLONOSCOPY WITH PROPOFOL (N/A )  Patient Location: PACU  Anesthesia Type:General  Level of Consciousness: sedated  Airway & Oxygen Therapy: Patient Spontanous Breathing and Patient connected to nasal cannula oxygen  Post-op Assessment: Report given to RN and Post -op Vital signs reviewed and stable  Post vital signs: Reviewed and stable  Last Vitals:  Vitals Value Taken Time  BP 104/59 01/22/2018 10:35 AM  Temp 35.8 C 01/22/2018 10:35 AM  Pulse 69 01/22/2018 10:35 AM  Resp 18 01/22/2018 10:35 AM  SpO2 97 % 01/22/2018 10:35 AM  Vitals shown include unvalidated device data.  Last Pain:  Vitals:   01/22/18 1035  TempSrc: Temporal         Complications: No apparent anesthesia complications

## 2018-01-22 NOTE — Anesthesia Procedure Notes (Signed)
Date/Time: 01/22/2018 10:10 AM Performed by: Ginger CarneMichelet, Evolet Salminen, CRNA Pre-anesthesia Checklist: Patient identified, Emergency Drugs available, Suction available, Patient being monitored and Timeout performed Patient Re-evaluated:Patient Re-evaluated prior to induction Oxygen Delivery Method: Nasal cannula Preoxygenation: Pre-oxygenation with 100% oxygen

## 2018-01-22 NOTE — Op Note (Signed)
[facility] Gastroenterology Patient Name: Frances BullsSusana Martinez Anaya Procedure Date: 01/22/2018 10:10 AM MRN: [id number] Account #: [id number] Date of Birth: 15-Aug-1961 Admit Type: Outpatient Age: 57 [facility]OM 4 Gender: Female Note Status: Finalized Procedure:            Colonoscopy Indications:          Rectal bleeding Providers:            Wyline MoodKiran Cartez Mogle MD, MD Referring MD:         Loletta Specteroger David Gomez (Referring MD) Medicines:            Monitored Anesthesia Care Complications:        No immediate complications. Procedure:            Pre-Anesthesia Assessment:                       - Prior to the procedure, a History and Physical was                        performed, and patient medications, allergies and                        sensitivities were reviewed. The patient's tolerance of                        previous anesthesia was reviewed.                       - The risks and benefits of the procedure and the                        sedation options and risks were discussed with the                        patient. All questions were answered and informed                        consent was obtained.                       - ASA Grade Assessment: II - A patient with mild                        systemic disease.                       After obtaining informed consent, the colonoscope was                        passed under direct vision. Throughout the procedure,                        the patient's blood pressure, pulse, and oxygen                        saturations were monitored continuously. The                        Colonoscope was introduced through the anus and                        advanced  to the the cecum, identified by the                        appendiceal orifice, IC valve and transillumination.                        The colonoscopy was performed with ease. The patient                        tolerated the procedure well. The quality of the bowel                         preparation was good. Findings:      The perianal and digital rectal examinations were normal.      Multiple small-mouthed diverticula were found in the sigmoid colon.      Non-bleeding internal hemorrhoids were found during retroflexion. The       hemorrhoids were large and Grade I (internal hemorrhoids that do not       prolapse).      The exam was otherwise without abnormality on direct and retroflexion       views. Impression:           - Diverticulosis in the sigmoid colon.                       - Non-bleeding internal hemorrhoids.                       - The examination was otherwise normal on direct and                        retroflexion views.                       - No specimens collected. Recommendation:       - Discharge patient to home.                       - Resume previous diet.                       - Continue present medications.                       - Repeat colonoscopy in 10 years for screening purposes. Procedure Code(s):    --- Professional ---                       [phone], Colonoscopy, flexible; diagnostic, including                        collection of specimen(s) by brushing or washing, when                        performed (separate procedure) Diagnosis Code(s):    --- Professional ---                       K64.0, First degree hemorrhoids                       K62.5, Hemorrhage of anus and rectum  K57.30, Diverticulosis of large intestine without                        perforation or abscess without bleeding CPT copyright 2017 American Medical Association. All rights reserved. The codes documented in this report are preliminary and upon coder review may  be revised to meet current compliance requirements. [name], MD [name] MD, MD 01/22/2018 10:32:45 AM This report has been signed electronically. Number of Addenda: 0 Note Initiated On: 01/22/2018 10:10 AM Scope Withdrawal Time: 0 hours 12 minutes 9 seconds   Total Procedure Duration: 0 hours 15 minutes 0 seconds       [facility]

## 2018-01-22 NOTE — Anesthesia Postprocedure Evaluation (Signed)
Anesthesia Post Note  Patient: Frances Martin  Procedure(s) Performed: COLONOSCOPY WITH PROPOFOL (N/A )  Patient location during evaluation: Endoscopy Anesthesia Type: General Level of consciousness: awake and alert Pain management: pain level controlled Vital Signs Assessment: post-procedure vital signs reviewed and stable Respiratory status: spontaneous breathing, nonlabored ventilation, respiratory function stable and patient connected to nasal cannula oxygen Cardiovascular status: blood pressure returned to baseline and stable Postop Assessment: no apparent nausea or vomiting Anesthetic complications: no     Last Vitals:  Vitals:   01/22/18 1035 01/22/18 1037  BP: (!) 104/59 107/64  Pulse: 70   Resp: 15   Temp: (!) 35.8 C   SpO2: 96%     Last Pain:  Vitals:   01/22/18 1104  TempSrc:   PainSc: 0-No pain                 Lenard SimmerAndrew Maryl Blalock

## 2018-01-22 NOTE — Anesthesia Post-op Follow-up Note (Signed)
Anesthesia QCDR form completed.        

## 2018-01-23 ENCOUNTER — Encounter: Payer: Self-pay | Admitting: Gastroenterology

## 2018-06-26 ENCOUNTER — Encounter (INDEPENDENT_AMBULATORY_CARE_PROVIDER_SITE_OTHER): Payer: Self-pay | Admitting: Physician Assistant

## 2018-06-26 ENCOUNTER — Ambulatory Visit (INDEPENDENT_AMBULATORY_CARE_PROVIDER_SITE_OTHER): Payer: Self-pay | Admitting: Physician Assistant

## 2018-06-26 VITALS — BP 128/76 | HR 91 | Temp 98.1°F | Ht 65.0 in | Wt 198.6 lb

## 2018-06-26 DIAGNOSIS — K219 Gastro-esophageal reflux disease without esophagitis: Secondary | ICD-10-CM

## 2018-06-26 DIAGNOSIS — M25562 Pain in left knee: Secondary | ICD-10-CM

## 2018-06-26 DIAGNOSIS — M79642 Pain in left hand: Secondary | ICD-10-CM

## 2018-06-26 DIAGNOSIS — M25561 Pain in right knee: Secondary | ICD-10-CM

## 2018-06-26 DIAGNOSIS — M79641 Pain in right hand: Secondary | ICD-10-CM

## 2018-06-26 DIAGNOSIS — Z23 Encounter for immunization: Secondary | ICD-10-CM

## 2018-06-26 DIAGNOSIS — R7303 Prediabetes: Secondary | ICD-10-CM

## 2018-06-26 DIAGNOSIS — Z131 Encounter for screening for diabetes mellitus: Secondary | ICD-10-CM

## 2018-06-26 DIAGNOSIS — M2550 Pain in unspecified joint: Secondary | ICD-10-CM

## 2018-06-26 DIAGNOSIS — R400 Somnolence: Secondary | ICD-10-CM

## 2018-06-26 DIAGNOSIS — K056 Periodontal disease, unspecified: Secondary | ICD-10-CM

## 2018-06-26 LAB — POCT GLYCOSYLATED HEMOGLOBIN (HGB A1C): Hemoglobin A1C: 6.3 % — AB (ref 4.0–5.6)

## 2018-06-26 MED ORDER — OMEPRAZOLE 40 MG PO CPDR: 40.0000 mg | DELAYED_RELEASE_CAPSULE | Freq: Every day | ORAL | 3 refills | Status: DC

## 2018-06-26 MED ORDER — METFORMIN HCL ER 500 MG PO TB24: 500.0000 mg | ORAL_TABLET | Freq: Every day | ORAL | 1 refills | Status: DC

## 2018-06-26 MED ORDER — CELECOXIB 200 MG PO CAPS: 200.0000 mg | ORAL_CAPSULE | Freq: Two times a day (BID) | ORAL | 5 refills | Status: DC

## 2018-06-26 NOTE — Progress Notes (Signed)
Subjective:  [name], female    DOB: Dec 10, 1960  Age: 57 y.o. MRN: [id number]  CC: bone pain   HPI    Frances Martin a 57 y.o.femalewith a medical history of GERD presents with complaint of bilateral hand pain and knee pain for greater than one year. Last seen here a year ago for similar symptoms. Left knee XR normal. Previous labs reveal negative/normal CMP, CBC, ANA, ESR, CRP, TSH, HCV, and FIT. Has taken OTC NSAIDs but seem to aggravate her GERD. She has taken omeprazole with relief of symptoms and requests refill. Thinks reflux is disturbing her sleep. She has been told she snores loudly and stops breathing momentarily during her sleep. Lastly, she would like dental referral for maintenance and her flu vaccination.            Outpatient Medications Prior to Visit  Medication Sig Dispense Refill  . aspirin EC 81 MG tablet Take 81 mg by mouth daily.    . Cholecalciferol (VITAMIN D3) 5000 units CAPS Take 1 capsule (5,000 Units total) by mouth daily. 30 capsule 0   No facility-administered medications prior to visit.      ROS Review of Systems  Constitutional: Negative for chills, fever and malaise/fatigue.  Eyes: Negative for blurred vision.  Respiratory: Negative for shortness of breath.   Cardiovascular: Negative for chest pain and palpitations.  Gastrointestinal: Negative for abdominal pain and nausea.  Genitourinary: Negative for dysuria and hematuria.  Musculoskeletal: Negative for joint pain and myalgias.  Skin: Negative for rash.  Neurological: Negative for tingling and headaches.  Psychiatric/Behavioral: Negative for depression. The patient is not nervous/anxious.     Objective:  BP 128/76 (BP Location: Left Arm, Patient Position: Sitting, Cuff Size: Normal)   Pulse 91   Temp 98.1 F (36.7 C) (Oral)   Ht '5\' 5"'$  (1.651 m)   Wt 198 lb 9.6 oz (90.1 kg)   SpO2 96%   BMI 33.05 kg/m   BP/Weight 06/26/2018 01/22/2018 09/18/8248   Systolic BP 037 048 889  Diastolic BP 76 64 72  Wt. (Lbs) 198.6 190 190  BMI 33.05 31.62 33.66      Physical Exam  Constitutional: She is oriented to person, place, and time.  Well developed, well nourished, NAD, polite  HENT:  Head: Normocephalic and atraumatic.  Receded gumline  Eyes: No scleral icterus.  Neck: Normal range of motion. Neck supple. No thyromegaly present.  Cardiovascular: Normal rate, regular rhythm and normal heart sounds.  Pulmonary/Chest: Effort normal and breath sounds normal.  Musculoskeletal: She exhibits no edema.  No deformity of hands and fingers  Neurological: She is alert and oriented to person, place, and time.  Skin: Skin is warm and dry. No rash noted. No erythema. No pallor.  Psychiatric: She has a normal mood and affect. Her behavior is normal. Thought content normal.  Vitals reviewed.    Assessment & Plan:    1. Arthralgia, unspecified joint - Rheumatoid factor; Future - VITAMIN D 25 Hydroxy (Vit-D Deficiency, Fractures); Future  2. Screening for diabetes mellitus - HgB A1c 6.3%  3. Prediabetes - Begin metFORMIN (GLUCOPHAGE-XR) 500 MG 24 hr tablet; Take 1 tablet (500 mg total) by mouth daily with breakfast.  Dispense: 90 tablet; Refill: 1 - CBC with Differential; Future - Comprehensive metabolic panel; Future  4. Gastroesophageal reflux disease, esophagitis presence not specified - Begin omeprazole (PRILOSEC) 40 MG capsule; Take 1 capsule (40 mg total) by mouth daily.  Dispense: 30 capsule; Refill: 3  5. Daytime somnolence - Ambulatory referral to Sleep Studies  6. Need for prophylactic vaccination and inoculation against influenza - Flu Vaccine QUAD 6+ mos PF IM (Fluarix Quad PF)  7. Periodontal disease - Ambulatory referral to Dentistry   Meds ordered this encounter  Medications  . metFORMIN (GLUCOPHAGE-XR) 500 MG 24 hr tablet    Sig: Take 1 tablet (500 mg total) by mouth daily with breakfast.    Dispense:  90 tablet     Refill:  1    Order Specific Question:   Supervising Provider    Answer:   Charlott Rakes [[id number]]  . omeprazole (PRILOSEC) 40 MG capsule    Sig: Take 1 capsule (40 mg total) by mouth daily.    Dispense:  30 capsule    Refill:  3    Order Specific Question:   Supervising Provider    Answer:   Charlott Rakes [[id number]]  . celecoxib (CELEBREX) 200 MG capsule    Sig: Take 1 capsule (200 mg total) by mouth 2 (two) times daily.    Dispense:  60 capsule    Refill:  5    Order Specific Question:   Supervising Provider    Answer:   Charlott Rakes [[id number]]    Follow-up: Return in about 12 weeks (around 09/18/2018) for F/u GERD, arthralgia.   Clent Demark PA

## 2018-06-26 NOTE — Patient Instructions (Signed)
Plan de alimentacin para la prediabetes (Prediabetes Eating Plan) La prediabetes, tambin llamada intolerancia a la glucosa o alteracin de la glucosa en ayunas, es una afeccin que eleva los niveles de azcar en la sangre (glucemia) por encima de lo normal. Seguir una dieta saludable puede ayudar a mantener la prediabetes bajo control, y tambin reduce el riesgo de tener diabetes tipo2 y cardiopata, que es ms alto en las personas que tienen esta afeccin. Junto con la actividad fsica habitual, una dieta saludable:  Promueve la prdida de peso.  Ayuda a controlar los niveles de azcar en la sangre.  Ayuda a mejorar la forma en que el organismo usa la insulina. QU DEBO SABER ACERCA DE ESTE PLAN DE ALIMENTACIN?  Use el ndice glucmico (IG) para planificar las comidas. El ndice le informa con qu rapidez un alimento elevar su nivel de azcar en la sangre. Elija los alimentos con bajo IG. Estos tardan ms tiempo en subir el nivel de azcar en la sangre.  Preste mucha atencin a la cantidad de hidratos de carbono que hay en los alimentos que consume. Los hidratos de carbono aumentan los niveles de azcar en la sangre.  Lleve un registro de la cantidad de caloras que ingiere. Ingerir la cantidad correcta de caloras lo ayudar a alcanzar un peso saludable. Bajar alrededor del 7por ciento del peso inicial puede ayudar a evitar la diabetes tipo2.  Tal vez deba seguir una dieta mediterrnea. Esta incluye una gran cantidad de verduras, carnes magras o pescado, cereales integrales, frutas, as como aceites y grasas saludables.  QU ALIMENTOS PUEDO COMER? Cereales Cereales integrales, como panes, galletas, cereales y pastas de salvado o integrales. Avena sin azcar. Trigo burgol. Cebada. Quinua. Arroz integral. Tortillas o tacos de harina de maz o de salvado. Verduras Lechuga. Espinaca. Guisantes. Remolachas. Coliflor. Repollo. Brcoli. Zanahorias. Tomates. Calabaza. Berenjena. Hierbas.  Pimientos. Cebollas. Pepinos. Repollitos de Bruselas. Frutas Frutos rojos. Bananas. Manzanas. Naranjas. Uvas. Papaya. Mango. Granada. Kiwi. Pomelo. Cerezas. Carnes y otras fuentes de protenas Mariscos. Carnes magras, entre ellas, pollo y pavo o cortes magros de carne de cerdo y de vaca. Tofu. Huevos. Los frutos secos. Frijoles. Lcteos Productos lcteos descremados o semidescremados, como yogur, queso cottage y queso. Bebidas Agua. T. Caf. Gaseosas sin azcar o dietticas. Agua de Seltz. Leche. Productos alternativos de la leche, como leche de soja o de almendra. Condimentos Mostaza. Salsa de pepinillos. Ktchup con bajo contenido de grasa y de azcar. Salsa barbacoa con bajo contenido de grasa y de azcar. Mayonesa sin grasa o con bajo contenido de grasa. Dulces y postres Budines sin azcar o con bajo contenido de grasa. Helados y otros dulces congelados sin azcar o con bajo contenido de grasa. Grasas y aceites Aguacate. Nueces. Aceite de oliva. Los artculos mencionados arriba pueden no ser una lista completa de las bebidas o los alimentos recomendados. Comunquese con el nutricionista para conocer ms opciones. QU ALIMENTOS NO SE RECOMIENDAN? Cereales Productos a base de harina y de harina blanca refinada, como panes, pastas, bocadillos y cereales. Bebidas Bebidas azucaradas, como t helado y gaseosas con azcar. Dulces y postres Productos de panadera, como tortas, madalenas, pasteles, galletitas y tarta de queso. Los artculos mencionados arriba pueden no ser una lista completa de las bebidas y los alimentos que se deben evitar. Comunquese con el nutricionista para obtener ms informacin. Esta informacin no tiene como fin reemplazar el consejo del mdico. Asegrese de hacerle al mdico cualquier pregunta que tenga. Document Released: 04/22/2015 Document Revised: 04/22/2015 Document Reviewed: 08/27/2014 Elsevier   Interactive Patient Education  2017 Elsevier Inc.  

## 2018-06-27 ENCOUNTER — Other Ambulatory Visit (INDEPENDENT_AMBULATORY_CARE_PROVIDER_SITE_OTHER): Payer: Self-pay | Admitting: Physician Assistant

## 2018-06-27 DIAGNOSIS — R400 Somnolence: Secondary | ICD-10-CM

## 2018-06-27 MED FILL — OMEPRAZOLE DR 40 MG CAPSULE: 40 | 30 days supply | Qty: 30 | Fill #0

## 2018-06-27 MED FILL — METFORMIN HCL ER 500 MG TAB: 500 | 30 days supply | Qty: 30 | Fill #0

## 2018-07-03 ENCOUNTER — Other Ambulatory Visit (INDEPENDENT_AMBULATORY_CARE_PROVIDER_SITE_OTHER): Payer: Self-pay

## 2018-07-03 DIAGNOSIS — R7303 Prediabetes: Secondary | ICD-10-CM

## 2018-07-03 DIAGNOSIS — M2550 Pain in unspecified joint: Secondary | ICD-10-CM

## 2018-07-03 NOTE — Progress Notes (Signed)
Labs collected by onsite phlebotomist. Tempestt S Roberts, CMA  

## 2018-07-04 LAB — COMPREHENSIVE METABOLIC PANEL
A/G RATIO: 1.2 (ref 1.2–2.2)
ALT: 46 IU/L — AB (ref 0–32)
AST: 21 IU/L (ref 0–40)
Albumin: 4.1 g/dL (ref 3.5–5.5)
Alkaline Phosphatase: 93 IU/L (ref 39–117)
BILIRUBIN TOTAL: 0.3 mg/dL (ref 0.0–1.2)
BUN/Creatinine Ratio: 19 (ref 9–23)
BUN: 13 mg/dL (ref 6–24)
CHLORIDE: 103 mmol/L (ref 96–106)
CO2: 23 mmol/L (ref 20–29)
Calcium: 9.6 mg/dL (ref 8.7–10.2)
Creatinine, Ser: 0.69 mg/dL (ref 0.57–1.00)
GFR calc Af Amer: 113 mL/min/{1.73_m2} (ref >59)
GFR calc non Af Amer: 98 mL/min/{1.73_m2} (ref >59)
GLOBULIN, TOTAL: 3.3 g/dL (ref 1.5–4.5)
Glucose: 113 mg/dL — ABNORMAL HIGH (ref 65–99)
POTASSIUM: 4.5 mmol/L (ref 3.5–5.2)
SODIUM: 143 mmol/L (ref 134–144)
Total Protein: 7.4 g/dL (ref 6.0–8.5)

## 2018-07-04 LAB — CBC WITH DIFFERENTIAL/PLATELET
BASOS ABS: 0.1 10*3/uL (ref 0.0–0.2)
BASOS: 1 %
EOS (ABSOLUTE): 0.1 10*3/uL (ref 0.0–0.4)
Eos: 2 %
Hematocrit: 41.2 % (ref 34.0–46.6)
Hemoglobin: 13.9 g/dL (ref 11.1–15.9)
IMMATURE GRANULOCYTES: 0 %
Immature Grans (Abs): 0 10*3/uL (ref 0.0–0.1)
LYMPHS: 36 %
Lymphocytes Absolute: 3 10*3/uL (ref 0.7–3.1)
MCH: 30.3 pg (ref 26.6–33.0)
MCHC: 33.7 g/dL (ref 31.5–35.7)
MCV: 90 fL (ref 79–97)
MONOS ABS: 0.5 10*3/uL (ref 0.1–0.9)
Monocytes: 6 %
NEUTROS PCT: 55 %
Neutrophils Absolute: 4.5 10*3/uL (ref 1.4–7.0)
PLATELETS: 332 10*3/uL (ref 150–450)
RBC: 4.59 x10E6/uL (ref 3.77–5.28)
RDW: 13.5 % (ref 12.3–15.4)
WBC: 8.2 10*3/uL (ref 3.4–10.8)

## 2018-07-04 LAB — RHEUMATOID FACTOR

## 2018-07-04 LAB — VITAMIN D 25 HYDROXY (VIT D DEFICIENCY, FRACTURES): VIT D 25 HYDROXY: 48.7 ng/mL (ref 30.0–100.0)

## 2018-07-06 ENCOUNTER — Telehealth (INDEPENDENT_AMBULATORY_CARE_PROVIDER_SITE_OTHER): Payer: Self-pay

## 2018-07-06 NOTE — Telephone Encounter (Signed)
Call placed using pacific interpreter [name]([phone]) patient is aware that vitamin D is normal and rheumatoid factor negative. All other labs normal. Advised patient to eat a low fat diet. Patient expressed understanding. Frances Martin, CMA

## 2018-07-06 NOTE — Telephone Encounter (Signed)
-----   Message from Loletta Specteroger David Gomez, PA-C sent at 07/04/2018  1:31 PM EST ----- Vit D normal, Rheumatoid factor negative, rest of labs essentially normal. I advise a low fat diet.

## 2018-07-20 ENCOUNTER — Ambulatory Visit: Payer: Self-pay | Attending: Physician Assistant

## 2018-07-25 ENCOUNTER — Other Ambulatory Visit (HOSPITAL_COMMUNITY): Payer: Self-pay | Admitting: *Deleted

## 2018-07-25 DIAGNOSIS — Z1231 Encounter for screening mammogram for malignant neoplasm of breast: Secondary | ICD-10-CM

## 2018-08-02 ENCOUNTER — Ambulatory Visit (INDEPENDENT_AMBULATORY_CARE_PROVIDER_SITE_OTHER): Payer: Self-pay | Admitting: Physician Assistant

## 2018-08-02 ENCOUNTER — Other Ambulatory Visit: Payer: Self-pay

## 2018-08-02 ENCOUNTER — Encounter (INDEPENDENT_AMBULATORY_CARE_PROVIDER_SITE_OTHER): Payer: Self-pay | Admitting: Physician Assistant

## 2018-08-02 VITALS — BP 152/74 | HR 82 | Temp 97.9°F | Ht 65.0 in | Wt 195.2 lb

## 2018-08-02 DIAGNOSIS — M2550 Pain in unspecified joint: Secondary | ICD-10-CM

## 2018-08-02 DIAGNOSIS — R7303 Prediabetes: Secondary | ICD-10-CM

## 2018-08-02 MED ORDER — PRAVASTATIN SODIUM 20 MG PO TABS: 20.0000 mg | ORAL_TABLET | Freq: Every day | ORAL | 3 refills | Status: DC

## 2018-08-02 MED ORDER — METFORMIN HCL ER 500 MG PO TB24: 500.0000 mg | ORAL_TABLET | Freq: Every day | ORAL | 6 refills | Status: DC

## 2018-08-02 MED ORDER — CELECOXIB 200 MG PO CAPS: 200.0000 mg | ORAL_CAPSULE | Freq: Two times a day (BID) | ORAL | 6 refills | Status: DC

## 2018-08-02 MED FILL — PRAVASTATIN SODIUM 20 MG TA: 20 | 90 days supply | Qty: 90 | Fill #0

## 2018-08-02 MED FILL — METFORMIN HCL ER 500 MG TAB: 500 | 30 days supply | Qty: 30 | Fill #0

## 2018-08-02 MED FILL — CELECOXIB 200 MG CAP: 200 | 30 days supply | Qty: 60 | Fill #0

## 2018-08-02 NOTE — Patient Instructions (Signed)
Pravastatin tablets  Qu es este medicamento?  La PRAVASTATINA es conocido como un inhibidor de la HMG-CoA reductasa o 'estatias'. Reduce el nivel de colesterol y triglicridos en la sangre. Este medicamento tambin puede reducir el riesgo de padecer ataques cardiacos, derrame cerebral u otros problemas de la salud en pacientes que corren el riesgo de padecer una enfermedad cardiaca. Una dieta y cambios a su estilo de vida son a menudo utilizados con este medicamento.  Este medicamento puede ser utilizado para otros usos; si tiene alguna pregunta consulte con su proveedor de atencin mdica o con su farmacutico.  MARCAS COMUNES: Pravachol  Qu le debo informar a mi profesional de la salud antes de tomar este medicamento?  Necesitan saber si usted presenta alguno de los siguientes problemas o situaciones:  diabetes si bebe alcohol con frecuencia antecedentes de accidente cerebrovascular enfermedad renal enfermedad heptica debilidad o dolores musculares enfermedad tiroidea una reaccin alrgica o inusual a la pravastatina, a otros medicamentos, alimentos, colorantes o conservantes si est embarazada o buscando quedar embarazada si est amamantando a un beb  Cmo debo utilizar este medicamento?  Tome las tabletas de pravastatina por va oral. Ingiera las tabletas con un vaso de agua. Puede tomar la pravastatina en cualquier momento del da, con o sin alimentos. Siga las instrucciones de la etiqueta del medicamento. Tome sus dosis a intervalos regulares. No tome su medicamento con una frecuencia mayor a la indicada.  Hable con su pediatra para informarse acerca del uso de este medicamento en nios. Puede requerir atencin especial. La pravastatina ha sido utilizada por nios a partir de los 8 aos de edad.  Sobredosis: Pngase en contacto inmediatamente con un centro toxicolgico o una sala de urgencia si usted cree que haya tomado demasiado medicamento.  ATENCIN: Este medicamento es solo para usted. No comparta  este medicamento con nadie.  Qu sucede si me olvido de una dosis?  Si olvida una dosis, tmela lo antes posible. Si es casi la hora de la prxima dosis, tome slo esa dosis. No tome dosis adicionales o dobles.  Qu puede interactuar con este medicamento?  Este medicamento podra interactuar con los siguientes frmacos: colchicina ciclosporina otros medicamentos para el colesterol alto algunos antibiticos, tales como azitromicina, claritromicina, eritromicina y telitromicina  Puede ser que esta lista no menciona todas las posibles interacciones. Informe a su profesional de la salud de todos los productos a base de hierbas, medicamentos de venta libre o suplementos nutritivos que est tomando. Si usted fuma, consume bebidas alcohlicas o si utiliza drogas ilegales, indqueselo tambin a su profesional de la salud. Algunas sustancias pueden interactuar con su medicamento.  A qu debo estar atento al usar este medicamento?  Visite a su mdico o a su profesional de la salud para revisar su evolucin peridicamente. Es posible que necesite realizarse pruebas peridicamente para asegurarse de que el hgado est funcionando en forma correcta.  Su profesional de la salud puede indicarle que deje de tomar este medicamento si desarrolla problemas musculares. Si sus problemas musculares no desaparecen despus de dejar de tomar este medicamento, contacte a su profesional de la salud.  No debe quedar embarazada mientras est tomando este medicamento. Las mujeres deben informar a su profesional de la salud si estn buscando quedar embarazadas o si creen que podran estar embarazadas. Existe la posibilidad de efectos secundarios graves en un beb sin nacer. Para obtener ms informacin, hable con su profesional de la salud o su farmacutico. No debe amamantar a un beb mientras est tomando este   medicamento.  Este medicamento podra afectar los niveles de azcar en la sangre. Si tiene diabetes, consulte a su mdico o a su  profesional de la salud antes de cambiar su dieta o la dosis de su medicamento para la diabetes.  Si va a someterse a una operacin o a otro procedimiento, informe a su mdico que est usando este medicamento.  Este frmaco es solo parte de un programa completo para salud cardiaca. Su mdico o un dietista puede sugerir una dieta baja en colesterol y en grasas para ayudar. Evite beber alcohol y fumar, y siga un programa de ejercicio fsico adecuado.  Este medicamento puede causar una disminucin de la Co-enzima Q-10. Debe asegurarse de recibir suficiente Co-enzima Q-10 mientras toma este medicamento. Converse con su profesional de la salud sobre los alimentos que come y las vitaminas que toma.  Qu efectos secundarios puedo tener al utilizar este medicamento?  Efectos secundarios que debe informar a su mdico o a su profesional de la salud tan pronto como sea posible:  -reacciones alrgicas como erupcin cutnea, picazn o urticarias, hinchazn de la cara, labios o lengua  -orina de color amarillo oscuro  -fiebre  -calambres, debilidad o dolores musculares  -enrojecimiento, formacin de ampollas, descamacin o distensin de la piel, inclusive dentro de la boca  -dificultad para orinar o cambios en el volumen de orina  -cansancio o debilidad inusual  -color amarillento de los ojos o la piel  Efectos secundarios que, por lo general, no requieren atencin mdica (debe informarlos a su mdico o a su profesional de la salud si persisten o si son molestos):  -gases  -dolor de cabeza  -acidez de estmago  -indigestin  -dolor de estmago  Puede ser que esta lista no menciona todos los posibles efectos secundarios. Comunquese a su mdico por asesoramiento mdico sobre los efectos secundarios. Usted puede informar los efectos secundarios a la FDA por telfono al 1-800-FDA-1088.  Dnde debo guardar mi medicina?  Mantngala fuera del alcance de los nios.  Gurdela a temperatura ambiente entre 15 y 30 grados C (59 y 86  grados F). Protjala de la luz. Mantenga el envase bien cerrado. Deseche todo el medicamento que no haya utilizado, despus de la fecha de vencimiento.  ATENCIN: Este folleto es un resumen. Puede ser que no cubra toda la posible informacin. Si usted tiene preguntas acerca de esta medicina, consulte con su mdico, su farmacutico o su profesional de la salud.   2019 Elsevier/Gold Standard (2017-05-25 00:00:00)

## 2018-08-02 NOTE — Progress Notes (Signed)
Subjective:  [name], female    DOB: 04-09-1961  Age: 57 y.o. MRN: [id number]  CC:   HPI Frances Nicholaus CorollaMartinez Anayais a 57 y.o.femalewith a medical history of GERDpresents with concern for prediabetes. Says she had a Coke and had eaten before her finger stick for A1c. She was prescribed Metformin 500 mg XR but took only once because of abdominal upset. Took metformin without a meal. She is confused as to why her sugar may be elevated. Does not consider herself to have unhealthy eating habits. Does not exercise regularly. Mother and father were diabetic. Denies any symptoms whatsoever.     Outpatient Medications Prior to Visit  Medication Sig Dispense Refill  . aspirin EC 81 MG tablet Take 81 mg by mouth daily.    . celecoxib (CELEBREX) 200 MG capsule Take 1 capsule (200 mg total) by mouth 2 (two) times daily. (Patient not taking: Reported on 08/02/2018) 60 capsule 5  . metFORMIN (GLUCOPHAGE-XR) 500 MG 24 hr tablet Take 1 tablet (500 mg total) by mouth daily with breakfast. (Patient not taking: Reported on 08/02/2018) 90 tablet 1  . omeprazole (PRILOSEC) 40 MG capsule Take 1 capsule (40 mg total) by mouth daily. (Patient not taking: Reported on 08/02/2018) 30 capsule 3   No facility-administered medications prior to visit.      ROS Review of Systems  Constitutional: Negative for chills, fever and malaise/fatigue.  Eyes: Negative for blurred vision.  Respiratory: Negative for shortness of breath.   Cardiovascular: Negative for chest pain and palpitations.  Gastrointestinal: Negative for abdominal pain and nausea.  Genitourinary: Negative for dysuria and hematuria.  Musculoskeletal: Negative for joint pain and myalgias.  Skin: Negative for rash.  Neurological: Negative for tingling and headaches.  Psychiatric/Behavioral: Negative for depression. The patient is not nervous/anxious.     Objective:  BP (!) 152/74 (BP Location: Left Arm, Patient Position:  Sitting, Cuff Size: Normal)   Pulse 82   Temp 97.9 F (36.6 C) (Oral)   Ht 5\' 5"  (1.651 m)   Wt 195 lb 3.2 oz (88.5 kg)   SpO2 95%   BMI 32.48 kg/m   BP/Weight 08/02/2018 06/26/2018 01/22/2018  Systolic BP 152 128 107  Diastolic BP 74 76 64  Wt. (Lbs) 195.2 198.6 190  BMI 32.48 33.05 31.62      Physical Exam Vitals signs reviewed.  Constitutional:      Comments: Well developed, well nourished, NAD, polite  HENT:     Head: Normocephalic and atraumatic.  Eyes:     General: No scleral icterus. Neck:     Musculoskeletal: Normal range of motion and neck supple.     Thyroid: No thyromegaly.  Pulmonary:     Effort: Pulmonary effort is normal.  Abdominal:     Palpations: Abdomen is soft.  Skin:    General: Skin is warm and dry.     Coloration: Skin is not pale.     Findings: No erythema or rash.  Neurological:     Mental Status: She is alert and oriented to person, place, and time.  Psychiatric:        Behavior: Behavior normal.        Thought Content: Thought content normal.      Assessment & Plan:    1. Prediabetes - Referral to Nutrition and Diabetes Services - Refill metFORMIN (GLUCOPHAGE-XR) 500 MG 24 hr tablet; Take 1 tablet (500 mg total) by mouth daily with breakfast.  Dispense: 30 tablet; Refill: 6 - Refill  pravastatin (PRAVACHOL) 20 MG tablet; Take 1 tablet (20 mg total) by mouth daily.  Dispense: 90 tablet; Refill: 3- Lipid panel; Future - Comprehensive metabolic panel; Future   2. Arthralgia, unspecified joint - Refill celecoxib (CELEBREX) 200 MG capsule; Take 1 capsule (200 mg total) by mouth 2 (two) times daily.  Dispense: 60 capsule; Refill: 6   Meds ordered this encounter  Medications  . celecoxib (CELEBREX) 200 MG capsule    Sig: Take 1 capsule (200 mg total) by mouth 2 (two) times daily.    Dispense:  60 capsule    Refill:  6    Order Specific Question:   Supervising Provider    Answer:   NEWLIN, ENOBONG [[id number]]  . metFORMIN (GHoy RegisterLUCOPHAGE-XR)  500 MG 24 hr tablet    Sig: Take 1 tablet (500 mg total) by mouth daily with breakfast.    Dispense:  30 tablet    Refill:  6    Order Specific Question:   Supervising Provider    Answer:   Hoy RegisterNEWLIN, ENOBONG [[id number]]  . pravastatin (PRAVACHOL) 20 MG tablet    Sig: Take 1 tablet (20 mg total) by mouth daily.    Dispense:  90 tablet    Refill:  3    Order Specific Question:   Supervising Provider    Answer:   Hoy RegisterNEWLIN, ENOBONG [[id number]]    Follow-up: Return in about 2 months (around 10/03/2018) for Lipid panel .   Loletta Specteroger David  PA

## 2018-08-18 ENCOUNTER — Ambulatory Visit (HOSPITAL_BASED_OUTPATIENT_CLINIC_OR_DEPARTMENT_OTHER): Payer: Self-pay | Attending: Physician Assistant | Admitting: Internal Medicine

## 2018-08-18 VITALS — Ht 62.0 in | Wt 180.0 lb

## 2018-08-18 DIAGNOSIS — R400 Somnolence: Secondary | ICD-10-CM | POA: Insufficient documentation

## 2018-08-25 DIAGNOSIS — R400 Somnolence: Secondary | ICD-10-CM

## 2018-08-25 NOTE — Procedures (Signed)
  Patient Name: Frances Martin, Torcivia Study Date: 08/18/2018 Gender: Female D.O.B: 09-29-1960 Age (years): 57 Referring Provider: Loletta Specter PA Height (inches): 62 Interpreting Physician: Jetty Duhamel MD, ABSM Weight (lbs): 180 RPSGT: Lise Auer BMI: 33 MRN: [id number] Neck Size: 16.50  CLINICAL INFORMATION Sleep Study Type: NPSG Indication for sleep study: Daytime Fatigue Epworth Sleepiness Score: 14  SLEEP STUDY TECHNIQUE As per the AASM Manual for the Scoring of Sleep and Associated Events v2.3 (April 2016) with a hypopnea requiring 4% desaturations.  The channels recorded and monitored were frontal, central and occipital EEG, electrooculogram (EOG), submentalis EMG (chin), nasal and oral airflow, thoracic and abdominal wall motion, anterior tibialis EMG, snore microphone, electrocardiogram, and pulse oximetry.  MEDICATIONS Medications self-administered by patient taken the night of the study : none reported  SLEEP ARCHITECTURE The study was initiated at 9:50:38 PM and ended at 5:00:36 AM.  Sleep onset time was 33.3 minutes and the sleep efficiency was 83.5%%. The total sleep time was 359 minutes.  Stage REM latency was 129.0 minutes.  The patient spent 7.5%% of the night in stage N1 sleep, 83.6%% in stage N2 sleep, 0.1%% in stage N3 and 8.8% in REM.  Alpha intrusion was absent.  Supine sleep was 39.55%.  RESPIRATORY PARAMETERS The overall apnea/hypopnea index (AHI) was 15.5 per hour. There were 16 total apneas, including 16 obstructive, 0 central and 0 mixed apneas. There were 77 hypopneas and 2 RERAs.  The AHI during Stage REM sleep was 62.9 per hour.  AHI while supine was 11.0 per hour.  The mean oxygen saturation was 92.1%. The minimum SpO2 during sleep was 70.0%.  loud snoring was noted during this study.  CARDIAC DATA The 2 lead EKG demonstrated sinus rhythm. The mean heart rate was 70.8 beats per minute. Other EKG findings include:  None.  LEG MOVEMENT DATA The total PLMS were 0 with a resulting PLMS index of 0.0. Associated arousal with leg movement index was 0.0 .  IMPRESSIONS - Moderate obstructive sleep apnea occurred during this study (AHI = 15.5/h). - No significant central sleep apnea occurred during this study (CAI = 0.0/h). - Oxygen desaturation was noted during this study (Min O2 = 70.0%). - The patient snored with loud snoring volume. - No cardiac abnormalities were noted during this study. - Clinically significant periodic limb movements did not occur during sleep. No significant associated arousals.  DIAGNOSIS - Obstructive Sleep Apnea (327.23 [G47.33 ICD-10])  RECOMMENDATIONS - Suggest CPAP titration sleep study or DME autopap. Other options would be based on clinical judgment. - Be careful with alcohol, sedatives and other CNS depressants that may worsen sleep apnea and disrupt normal sleep architecture. - Sleep hygiene should be reviewed to assess factors that may improve sleep quality. - Weight management and regular exercise should be initiated or continued if appropriate.  [Electronically signed] 08/25/2018 03:42 PM  Jetty Duhamel MD, ABSM Diplomate, American Board of Sleep Medicine   NPI: [id number]                           Jetty Duhamel Diplomate, American Board of Sleep Medicine  ELECTRONICALLY SIGNED ON:  08/25/2018, 3:37 PM [facility] PH: [phone]   FX: [phone] ACCREDITED BY THE AMERICAN ACADEMY OF SLEEP MEDICINE

## 2018-08-31 ENCOUNTER — Telehealth (INDEPENDENT_AMBULATORY_CARE_PROVIDER_SITE_OTHER): Payer: Self-pay

## 2018-08-31 ENCOUNTER — Other Ambulatory Visit: Payer: Self-pay | Admitting: Family Medicine

## 2018-08-31 DIAGNOSIS — G4733 Obstructive sleep apnea (adult) (pediatric): Secondary | ICD-10-CM

## 2018-08-31 MED FILL — CELECOXIB 200 MG CAP: 200 | 30 days supply | Qty: 60 | Fill #1

## 2018-08-31 MED FILL — METFORMIN HCL ER 500 MG TAB: 500 | 30 days supply | Qty: 30 | Fill #1

## 2018-08-31 NOTE — Telephone Encounter (Signed)
Call placed using pacific interpreter [phone]([phone]) left voicemail notifying patient that sleep study did reveal sleep apnea. Additional testing is required to determine settings for CPAP machine. Order has been placed for a CPAP titration and someone from the sleep center will call to schedule. Return call to [facility] at [phone] with any questions or concerns. Maryjean Mornempestt S Yissel Habermehl, CMA

## 2018-08-31 NOTE — Telephone Encounter (Signed)
-----   Message from Hoy Register, MD sent at 08/31/2018 10:25 AM EST ----- Sleep study revealed sleep apnea.  Additional testing is required to determining settings for a CPAP machine.  I have placed order for CPAP titration.

## 2018-09-25 ENCOUNTER — Telehealth: Payer: Self-pay | Admitting: Physician Assistant

## 2018-09-25 NOTE — Telephone Encounter (Signed)
Please call the patient back when you have time.

## 2018-09-25 NOTE — Telephone Encounter (Signed)
Pt want to know about a bill, she said she will have a copy for me at the [facility]

## 2018-09-27 ENCOUNTER — Other Ambulatory Visit (INDEPENDENT_AMBULATORY_CARE_PROVIDER_SITE_OTHER): Payer: Self-pay

## 2018-09-27 DIAGNOSIS — R7303 Prediabetes: Secondary | ICD-10-CM

## 2018-09-27 MED FILL — OMEPRAZOLE DR 40 MG CAPSULE: 40 | 30 days supply | Qty: 30 | Fill #1

## 2018-09-27 MED FILL — CELECOXIB 200 MG CAP: 200 | 30 days supply | Qty: 60 | Fill #2

## 2018-09-27 MED FILL — METFORMIN HCL ER 500 MG TAB: 500 | 30 days supply | Qty: 30 | Fill #2

## 2018-09-27 NOTE — Progress Notes (Signed)
Labs collected by onsite [facility] phlebotomist. Tempestt S Roberts, CMA  

## 2018-09-28 LAB — COMPREHENSIVE METABOLIC PANEL
ALBUMIN: 4.1 g/dL (ref 3.8–4.9)
ALT: 33 IU/L — ABNORMAL HIGH (ref 0–32)
AST: 24 IU/L (ref 0–40)
Albumin/Globulin Ratio: 1.4 (ref 1.2–2.2)
Alkaline Phosphatase: 85 IU/L (ref 39–117)
BILIRUBIN TOTAL: 0.3 mg/dL (ref 0.0–1.2)
BUN / CREAT RATIO: 18 (ref 9–23)
BUN: 14 mg/dL (ref 6–24)
CO2: 25 mmol/L (ref 20–29)
CREATININE: 0.8 mg/dL (ref 0.57–1.00)
Calcium: 9.5 mg/dL (ref 8.7–10.2)
Chloride: 102 mmol/L (ref 96–106)
GFR calc non Af Amer: 83 mL/min/{1.73_m2} (ref >59)
GFR, EST AFRICAN AMERICAN: 95 mL/min/{1.73_m2} (ref >59)
GLUCOSE: 114 mg/dL — AB (ref 65–99)
Globulin, Total: 3 g/dL (ref 1.5–4.5)
Potassium: 4.4 mmol/L (ref 3.5–5.2)
Sodium: 141 mmol/L (ref 134–144)
TOTAL PROTEIN: 7.1 g/dL (ref 6.0–8.5)

## 2018-09-28 LAB — LIPID PANEL
Chol/HDL Ratio: 3.2 ratio (ref 0.0–4.4)
Cholesterol, Total: 179 mg/dL (ref 100–199)
HDL: 56 mg/dL (ref >39)
LDL CALC: 95 mg/dL (ref 0–99)
Triglycerides: 142 mg/dL (ref 0–149)
VLDL Cholesterol Cal: 28 mg/dL (ref 5–40)

## 2018-10-05 ENCOUNTER — Telehealth (INDEPENDENT_AMBULATORY_CARE_PROVIDER_SITE_OTHER): Payer: Self-pay

## 2018-10-05 NOTE — Telephone Encounter (Signed)
Call placed using pacific interpreter [phone]) patient aware that cholesterol levels are normal and kidney and liver function normal as well. Maryjean Morn, CMA

## 2018-10-05 NOTE — Telephone Encounter (Signed)
-----   Message from Claiborne Rigg, NP sent at 09/30/2018  1:09 AM EST ----- Cholesterol levels are normal as well as kidney and liver function

## 2018-10-16 ENCOUNTER — Ambulatory Visit (HOSPITAL_COMMUNITY)
Admission: RE | Admit: 2018-10-16 | Discharge: 2018-10-16 | Disposition: A | Discharge status: Home / Self Care | Payer: Self-pay | Source: Ambulatory Visit | Attending: Obstetrics and Gynecology | Admitting: Obstetrics and Gynecology

## 2018-10-16 ENCOUNTER — Ambulatory Visit (INDEPENDENT_AMBULATORY_CARE_PROVIDER_SITE_OTHER): Payer: Self-pay | Admitting: Primary Care

## 2018-10-16 ENCOUNTER — Encounter (HOSPITAL_COMMUNITY): Payer: Self-pay

## 2018-10-16 ENCOUNTER — Ambulatory Visit
Admission: RE | Admit: 2018-10-16 | Discharge: 2018-10-16 | Disposition: A | Discharge status: Home / Self Care | Payer: No Typology Code available for payment source | Source: Ambulatory Visit | Attending: Obstetrics and Gynecology | Admitting: Obstetrics and Gynecology

## 2018-10-16 VITALS — BP 136/82 | Wt 194.0 lb

## 2018-10-16 DIAGNOSIS — Z1239 Encounter for other screening for malignant neoplasm of breast: Secondary | ICD-10-CM

## 2018-10-16 DIAGNOSIS — Z1231 Encounter for screening mammogram for malignant neoplasm of breast: Secondary | ICD-10-CM

## 2018-10-16 HISTORY — DX: Prediabetes: R73.03

## 2018-10-16 HISTORY — DX: Hyperlipidemia, unspecified: E78.5

## 2018-10-16 NOTE — Progress Notes (Signed)
No complaints today.   Pap Smear: Pap smear not completed today. Last Pap smear was 04/13/2017 at [facility]. Per patient has no history of an abnormal Pap smear. Last Pap smear result is in Epic.  Physical exam: Breasts Breasts symmetrical. No skin abnormalities bilateral breasts. No nipple retraction bilateral breasts. No nipple discharge bilateral breasts. No lymphadenopathy. No lumps palpated bilateral breasts. No complaints of pain or tenderness on exam. Referred patient to the [facility] for a screening mammogram. Appointment scheduled for Tuesday, October 16, 2018 at 0940.        Pelvic/Bimanual Patient is a current smoker. Discussed smoking cessation with patient. Referred to the [facility] and gave resources to free smoking cessation classes at [facility].  Smoking History: Patient has never smoked.  Patient Navigation: Patient education provided. Access to services provided for patient through [facility] program. Spanish interpreter provided.   Colorectal Cancer Screening: Patient had a colonoscopy completed 01/22/2018. Patient completed the FOBT stools card test  04/14/2017 that was negative. No complaints today.   Breast and Cervical Cancer Risk Assessment: Patient has no family history of breast cancer, known genetic mutations, or radiation treatment to the chest before age 58. Patient has no history of cervical dysplasia, immunocompromised, or DES exposure in-utero.  Risk Assessment    Risk Scores      10/16/2018   Last edited by: Lynnell Dike, LPN   5-year risk: 0.8 %   Lifetime risk: 5.7 %         Used Spanish interpreter Natale Lay from [place].

## 2018-10-16 NOTE — Patient Instructions (Addendum)
Explained breast self awareness with Frances Martin. Patient did not need a Pap smear today due to last Pap smear was 04/13/2017. Let her know [facility] will cover Pap smears every 3 years unless has a history of abnormal Pap smears. Referred patient to the [facility] for a screening mammogram. Appointment scheduled for Tuesday, October 16, 2018 at 0940. Let patient know the Breast Center will follow up with her within the next couple weeks with results of mammogram by letter or phone. Discussed smoking cessation with patient. Referred to the [facility] and gave resources to free smoking cessation classes at [facility]. Frances Martin verbalized understanding.  Frances Martin, Frances Maser, RN 8:50 AM

## 2018-10-23 ENCOUNTER — Ambulatory Visit (INDEPENDENT_AMBULATORY_CARE_PROVIDER_SITE_OTHER): Payer: Self-pay | Admitting: Primary Care

## 2018-10-23 ENCOUNTER — Other Ambulatory Visit: Payer: Self-pay

## 2018-10-23 ENCOUNTER — Encounter (INDEPENDENT_AMBULATORY_CARE_PROVIDER_SITE_OTHER): Payer: Self-pay | Admitting: Primary Care

## 2018-10-23 VITALS — BP 132/82 | HR 80 | Temp 98.3°F | Ht 62.0 in | Wt 192.2 lb

## 2018-10-23 DIAGNOSIS — R7303 Prediabetes: Secondary | ICD-10-CM

## 2018-10-23 DIAGNOSIS — K219 Gastro-esophageal reflux disease without esophagitis: Secondary | ICD-10-CM

## 2018-10-23 DIAGNOSIS — K029 Dental caries, unspecified: Secondary | ICD-10-CM

## 2018-10-23 DIAGNOSIS — K047 Periapical abscess without sinus: Secondary | ICD-10-CM

## 2018-10-23 MED ORDER — PANTOPRAZOLE SODIUM 40 MG PO TBEC: 40.0000 mg | DELAYED_RELEASE_TABLET | Freq: Every day | ORAL | 3 refills | Status: DC

## 2018-10-23 MED FILL — CELECOXIB 200 MG CAP: 200 | 30 days supply | Qty: 60 | Fill #3

## 2018-10-23 MED FILL — ?PANTOPRAZOLE SOD DR 40MG: 40 MG | 30 days supply | Qty: 30 | Fill #0

## 2018-10-23 MED FILL — METFORMIN HCL ER 500 MG TAB: 500 | 30 days supply | Qty: 30 | Fill #3

## 2018-10-23 NOTE — Patient Instructions (Signed)
Enfermedad de reflujo gastroesofgico en los adultos  Gastroesophageal Reflux Disease, Adult  El reflujo gastroesofgico (RGE) ocurre cuando el cido del estmago sube por el tubo que conecta la boca con el estmago (esfago). Normalmente, la comida baja por el esfago y se mantiene en el estmago, donde se la digiere. Cuando una persona tiene RGE, los alimentos y el cido estomacal suelen volver al esfago. Usted puede tener una enfermedad llamada enfermedad de reflujo gastroesofgico (ERGE) si el reflujo:   Sucede a menudo.   Causa sntomas frecuentes o muy intensos.   Causa problemas tales como dao en el esfago.  Cuando esto ocurre, el esfago duele y se hincha (inflama). Con el tiempo, la ERGE puede ocasionar pequeos agujeros (lceras) en el revestimiento del esfago.  Cules son las causas?  Esta afeccin se debe a un problema en el msculo que se encuentra entre el esfago y el estmago. Cuando este msculo est dbil o no es normal, no se cierra correctamente para impedir que los alimentos y el cido regresen del estmago. El msculo puede debilitarse debido a lo siguiente:   El consumo de tabaco.   Embarazo.   Tener cierto tipo de hernia (hernia de hiato).   Consumo de alcohol.   Ciertos alimentos y bebidas, como caf, chocolate, cebollas y menta.  Qu incrementa el riesgo?  Es ms probable que tenga esta afeccin si:   Tiene sobrepeso.   Tiene una enfermedad que afecta el tejido conjuntivo.   Usa antiinflamatorios no esteroideos (AINE).  Cules son los signos o los sntomas?  Los sntomas de esta afeccin incluyen:   Acidez estomacal.   Dificultad o dolor al tragar.   Sensacin de tener un bulto en la garganta.   Sabor amargo en la boca.   Mal aliento.   Tener una gran cantidad de saliva.   Estmago inflamado o con malestar.   Eructos.   Dolor en el pecho. El dolor de pecho puede deberse a distintas afecciones. Asegrese de consultar a su mdico si tiene dolor en el pecho.   Falta  de aire o respiracin ruidosa (sibilancias).   Tos constante (crnica) o durante la noche.   Desgaste de la superficie de los dientes (esmalte dental).   Prdida de peso.  Cmo se trata?  El tratamiento depender de la gravedad de los sntomas. El mdico puede sugerirle lo siguiente:   Cambios en la dieta.   Medicamentos.   Una ciruga.  Siga estas indicaciones en su casa:  Comida y bebida     Siga una dieta como se lo haya indicado el mdico. Es posible que deba evitar alimentos y bebidas, por ejemplo:  ? Caf y t (con o sin cafena).  ? Bebidas que contengan alcohol.  ? Bebidas energticas y deportivas.  ? Bebidas gaseosas y refrescos.  ? Chocolate y cacao.  ? Menta y esencia de menta.  ? Ajo y cebolla.  ? Rbano picante.  ? Alimentos cidos y condimentados. Estos incluyen todos los tipos de pimientos, chile en polvo, curry en polvo, vinagre, salsas picantes y salsa barbacoa.  ? Ctricos y sus jugos, por ejemplo, naranjas, limones y limas.  ? Alimentos que contengan tomate. Estos incluyen salsa roja, chile, salsa picante y pizza con salsa de tomate.  ? Alimentos fritos y grasos. Estos incluyen donas, papas fritas, papitas fritas de bolsa y aderezos con alto contenido de grasa.  ? Carnes con alto contenido de grasa. Estas incluye los perros calientes, chuletas o costillas, embutidos, jamn y tocino.  ?   Productos lcteos ricos en grasas, como leche entera, manteca y queso crema.   Consuma pequeas cantidades de comida con ms frecuencia. Evite consumir porciones abundantes.   Evite beber grandes cantidades de lquidos con las comidas.   Evite comer 2 o 3horas antes de acostarse.   Evite recostarse inmediatamente despus de comer.   No haga ejercicios enseguida despus de comer.  Estilo de vida     No consuma ningn producto que contenga nicotina o tabaco. Estos incluyen cigarrillos, cigarrillos electrnicos y tabaco para mascar. Si necesita ayuda para dejar de fumar, consulte al mdico.   Intente  reducir el nivel de estrs. Si necesita ayuda para hacer esto, consulte al mdico.   Si tiene sobrepeso, baje una cantidad de peso saludable para usted. Consulte a su mdico para bajar de peso de manera segura.  Indicaciones generales   Est atento a cualquier cambio en los sntomas.   Tome los medicamentos de venta libre y los recetados solamente como se lo haya indicado el mdico. No tome aspirina, ibuprofeno ni otros AINE a menos que el mdico lo autorice.   Use ropa holgada. No use nada apretado alrededor de la cintura.   Levante (eleve) la cabecera de la cama aproximadamente 6pulgadas (15cm).   Evite inclinarse si al hacerlo empeoran los sntomas.   Concurra a todas las visitas de seguimiento como se lo haya indicado el mdico. Esto es importante.  Comunquese con un mdico si:   Aparecen nuevos sntomas.   Adelgaza y no sabe por qu.   Tiene problemas para tragar o le duele cuando traga.   Tiene sibilancias o tos persistente.   Los sntomas no mejoran con el tratamiento.   Tiene la voz ronca.  Solicite ayuda inmediatamente si:   Siente dolor en los brazos, el cuello, la mandbula, los dientes o la espalda.   Se siente transpirado, mareado o tiene una sensacin de desvanecimiento.   Siente falta de aire o dolor en el pecho.   Vomita y el vmito tiene un aspecto similar a la sangre o a los posos de caf.   Pierde el conocimiento (se desmaya).   Las deposiciones (heces) son sanguinolentas o negras.   No puede tragar, beber o comer.  Resumen   Si una persona tiene enfermedad de reflujo gastroesofgico (ERGE), los alimentos y el cido estomacal suben al esfago y causan sntomas o problemas tales como dao en el esfago.   El tratamiento depender de la gravedad de los sntomas.   Siga una dieta como se lo haya indicado el mdico.   Tome todos los medicamentos solamente como se lo haya indicado el mdico.  Esta informacin no tiene como fin reemplazar el consejo del mdico. Asegrese de  hacerle al mdico cualquier pregunta que tenga.  Document Released: 09/03/2010 Document Revised: 03/15/2018 Document Reviewed: 03/15/2018  Elsevier Interactive Patient Education  2019 Elsevier Inc.

## 2018-10-23 NOTE — Progress Notes (Signed)
Acute Office Visit  Subjective:    [name], female    DOB: 1961/01/09, 58 y.o.   MRN: [id number]  Chief Complaint  Patient presents with  . Gastroesophageal Reflux  . Referral    dental    HPI Patient is in today dental referral for temperature changes causing pain , pre diabetes and GERD.  Past medical history of GERDpresentswith concern for acid reflux .  Past Medical History:  Diagnosis Date  . Hemorrhoids   . Hyperlipidemia   . Pre-diabetes     Past Surgical History:  Procedure Laterality Date  . CESAREAN SECTION     3 previous  . COLONOSCOPY WITH PROPOFOL N/A 11/14/2017   Procedure: COLONOSCOPY WITH PROPOFOL;  Surgeon: [name], MD;  Location: [facility] ENDOSCOPY;  Service: Gastroenterology;  Laterality: N/A;  . COLONOSCOPY WITH PROPOFOL N/A 01/22/2018   Procedure: COLONOSCOPY WITH PROPOFOL;  Surgeon: [name], MD;  Location: [facility] ENDOSCOPY;  Service: Gastroenterology;  Laterality: N/A;  . TUBAL LIGATION      Family History  Problem Relation Age of Onset  . Diabetes Mother   . Hypertension Mother   . Diabetes Father     Social History   Socioeconomic History  . Marital status: Married    Spouse name: Not on file  . Number of children: 3  . Years of education: Not on file  . Highest education level: 10th grade  Occupational History  . Not on file  Social Needs  . Financial resource strain: Not on file  . Food insecurity:    Worry: Not on file    Inability: Not on file  . Transportation needs:    Medical: No    Non-medical: No  Tobacco Use  . Smoking status: Current Every Day Smoker    Types: Cigarettes  . Smokeless tobacco: Never Used  . Tobacco comment: 3 a day  Substance and Sexual Activity  . Alcohol use: No    Frequency: Never  . Drug use: No  . Sexual activity: Yes  Lifestyle  . Physical activity:    Days per week: 7 days    Minutes per session: 90 min  . Stress: Very much  Relationships  . Social  connections:    Talks on phone: Never    Gets together: Once a week    Attends religious service: More than 4 times per year    Active member of club or organization: No    Attends meetings of clubs or organizations: Never    Relationship status: Married  . Intimate partner violence:    Fear of current or ex partner: No    Emotionally abused: No    Physically abused: No    Forced sexual activity: No  Other Topics Concern  . Not on file  Social History Narrative  . Not on file    Outpatient Medications Prior to Visit  Medication Sig Dispense Refill  . aspirin EC 81 MG tablet Take 81 mg by mouth daily.    . celecoxib (CELEBREX) 200 MG capsule Take 1 capsule (200 mg total) by mouth 2 (two) times daily. 60 capsule 6  . metFORMIN (GLUCOPHAGE-XR) 500 MG 24 hr tablet Take 1 tablet (500 mg total) by mouth daily with breakfast. 30 tablet 6  . omeprazole (PRILOSEC) 40 MG capsule Take 1 capsule (40 mg total) by mouth daily. 30 capsule 3  . pravastatin (PRAVACHOL) 20 MG tablet Take 1 tablet (20 mg total) by mouth daily. 90 tablet 3  No facility-administered medications prior to visit.     No Known Allergies  Review of Systems  Constitutional: Negative.   HENT: Negative.   Eyes: Negative.   Respiratory: Negative.   Cardiovascular: Negative.   Gastrointestinal: Positive for heartburn.  Genitourinary: Negative.   Musculoskeletal: Negative.   Skin: Negative.   Neurological: Negative.   Endo/Heme/Allergies: Negative.   Psychiatric/Behavioral: Negative.        Objective:    Physical Exam  Constitutional: She is oriented to person, place, and time. She appears well-developed and well-nourished.  HENT:  Head: Normocephalic.  Eyes: Pupils are equal, round, and reactive to light. EOM are normal.  Neck: Normal range of motion. Neck supple.  Cardiovascular: Normal rate and regular rhythm.  Pulmonary/Chest: Effort normal and breath sounds normal.  Abdominal: Soft. Bowel sounds are  normal.  Musculoskeletal: Normal range of motion.  Neurological: She is alert and oriented to person, place, and time.  Skin: Skin is warm.  Psychiatric: She has a normal mood and affect.    BP 132/82 (BP Location: Right Arm, Patient Position: Sitting, Cuff Size: Normal)   Pulse 80   Temp 98.3 F (36.8 C) (Oral)   Ht  (1.575 m)   Wt 192 lb 3.2 oz (87.2 kg)   SpO2 93%   BMI 35.15 kg/m  Wt Readings from Last 3 Encounters:  10/23/18 192 lb 3.2 oz (87.2 kg)  10/16/18 194 lb (88 kg)  08/18/18 180 lb (81.6 kg)    There are no preventive care reminders to display for this patient.  There are no preventive care reminders to display for this patient.   Lab Results  Component Value Date   TSH 2.200 04/14/2017   Lab Results  Component Value Date   WBC 8.2 07/03/2018   HGB 13.9 07/03/2018   HCT 41.2 07/03/2018   MCV 90 07/03/2018   PLT 332 07/03/2018   Lab Results  Component Value Date   NA 141 09/27/2018   K 4.4 09/27/2018   CO2 25 09/27/2018   GLUCOSE 114 (H) 09/27/2018   BUN 14 09/27/2018   CREATININE 0.80 09/27/2018   BILITOT 0.3 09/27/2018   ALKPHOS 85 09/27/2018   AST 24 09/27/2018   ALT 33 (H) 09/27/2018   PROT 7.1 09/27/2018   ALBUMIN 4.1 09/27/2018   CALCIUM 9.5 09/27/2018   Lab Results  Component Value Date   CHOL 179 09/27/2018   Lab Results  Component Value Date   HDL 56 09/27/2018   Lab Results  Component Value Date   LDLCALC 95 09/27/2018   Lab Results  Component Value Date   TRIG 142 09/27/2018   Lab Results  Component Value Date   CHOLHDL 3.2 09/27/2018   Lab Results  Component Value Date   HGBA1C 6.3 (A) 06/26/2018       Assessment & Plan:  1. Infected dental carries Sensitivity to temperature change - Ambulatory referral to Dentistry  Problem List Items Addressed This Visit    None    Visit Diagnoses    Infected dental carries    -  Primary   Relevant Orders   Ambulatory referral to Dentistry   Prediabetes        Gastroesophageal reflux disease, esophagitis presence not specified        .1. Infected dental carries sensitivity to temperature change  - Ambulatory referral to Dentistry  2. Prediabetes Cont metformin A1C last 6.3   3. Gastroesophageal reflux disease, esophagitis presence not specified omeperozole no longer affective  changed to protonix   No orders of the defined types were placed in this encounter.    Grayce Sessions, NP

## 2018-10-31 ENCOUNTER — Encounter (HOSPITAL_COMMUNITY): Payer: Self-pay | Admitting: *Deleted

## 2018-11-15 ENCOUNTER — Encounter (HOSPITAL_BASED_OUTPATIENT_CLINIC_OR_DEPARTMENT_OTHER): Payer: Self-pay

## 2018-12-03 IMAGING — DX DG LUMBAR SPINE COMPLETE 4+V
5 series · 5 of 5 positions shown · non-contrast
Comparison: No recent .

CLINICAL DATA: Left knee pain.

EXAM:
LUMBAR SPINE - COMPLETE 4+ VIEW

[l-spine ap]
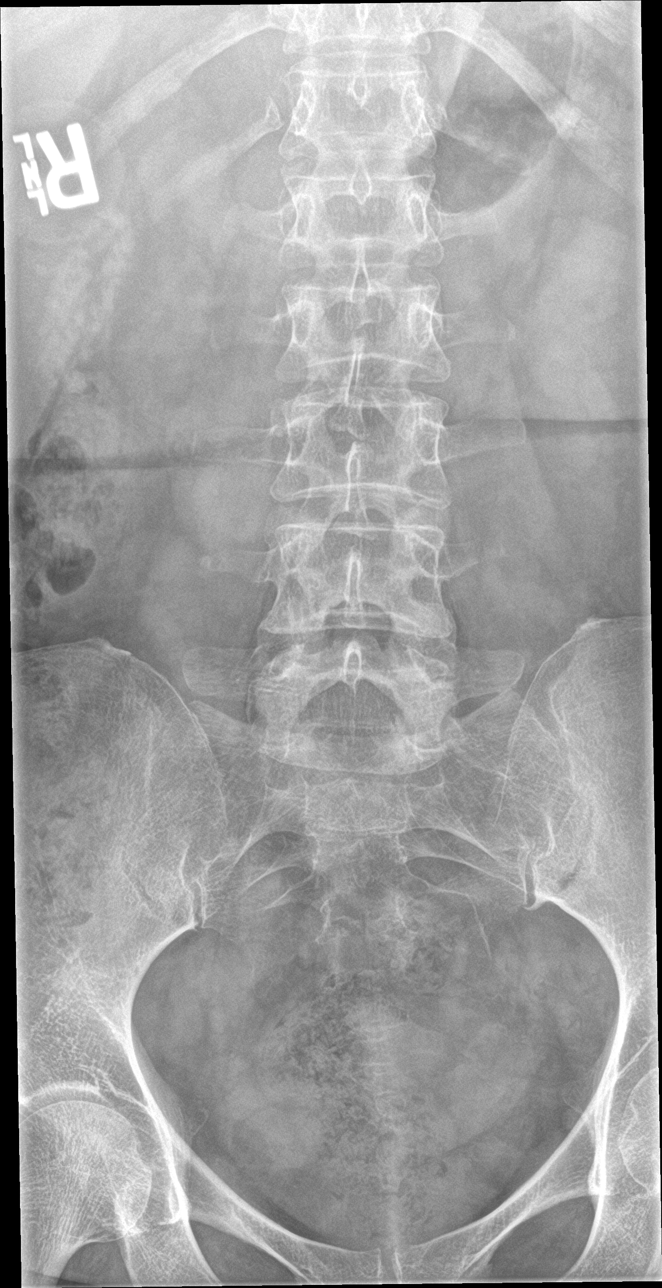

[l-spine obl (1 of 2)]
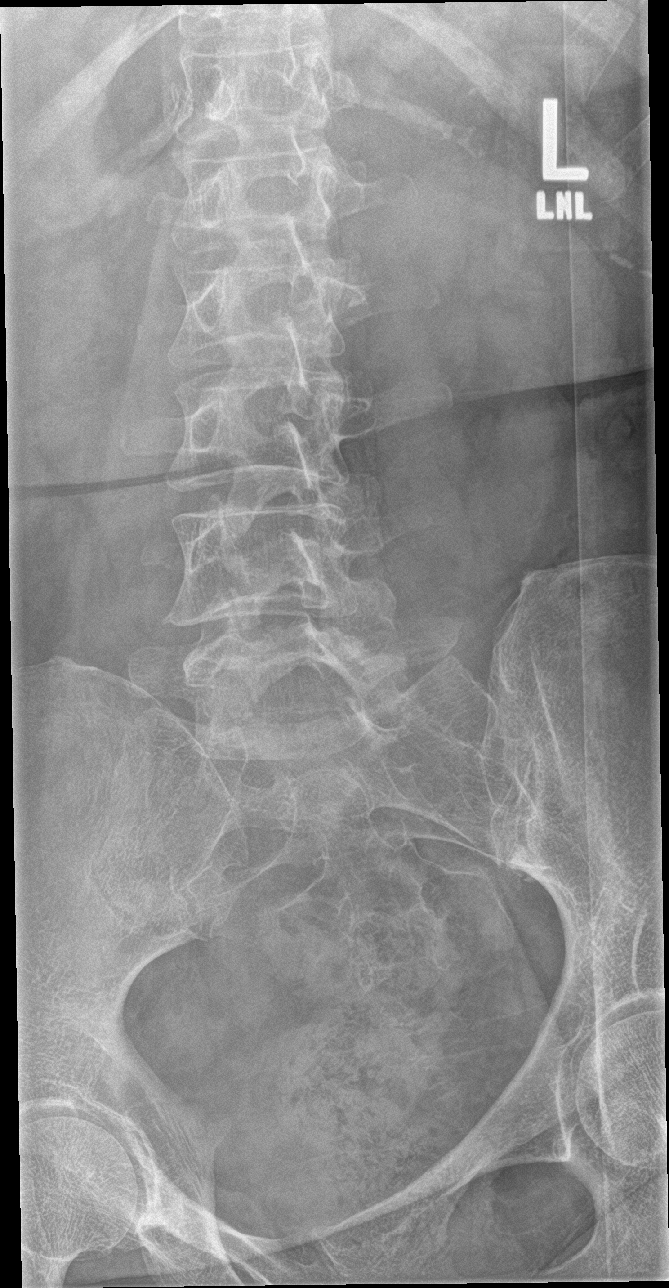

[l-spine obl (2 of 2)]
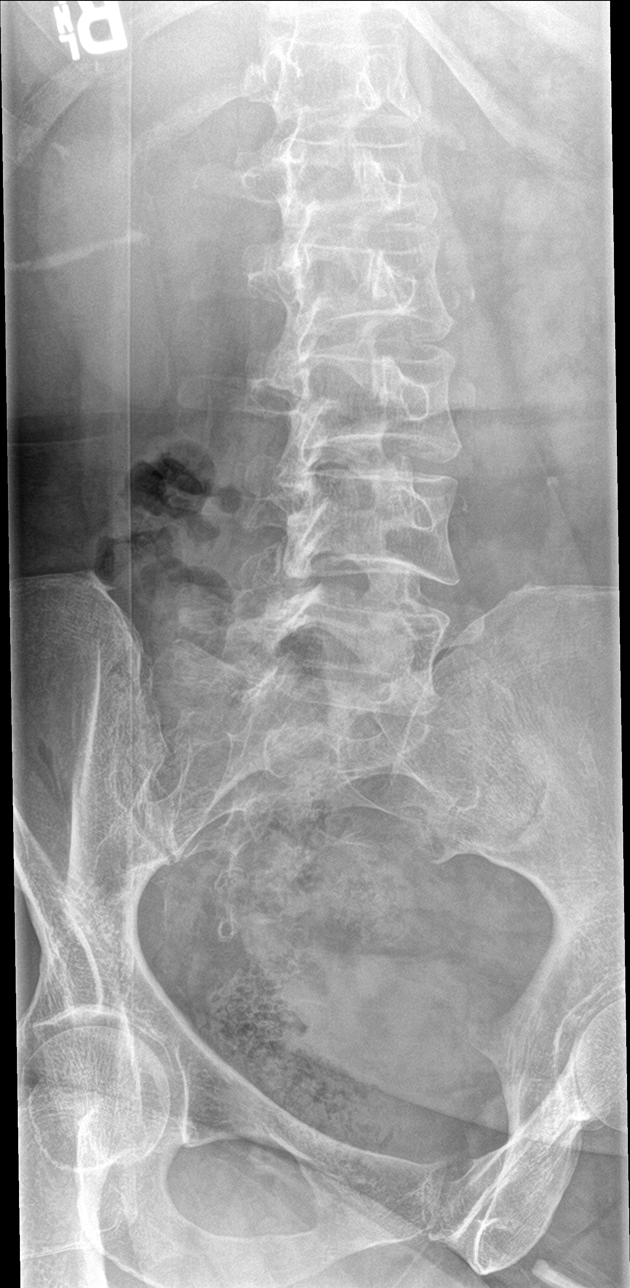

[l-spine lat]
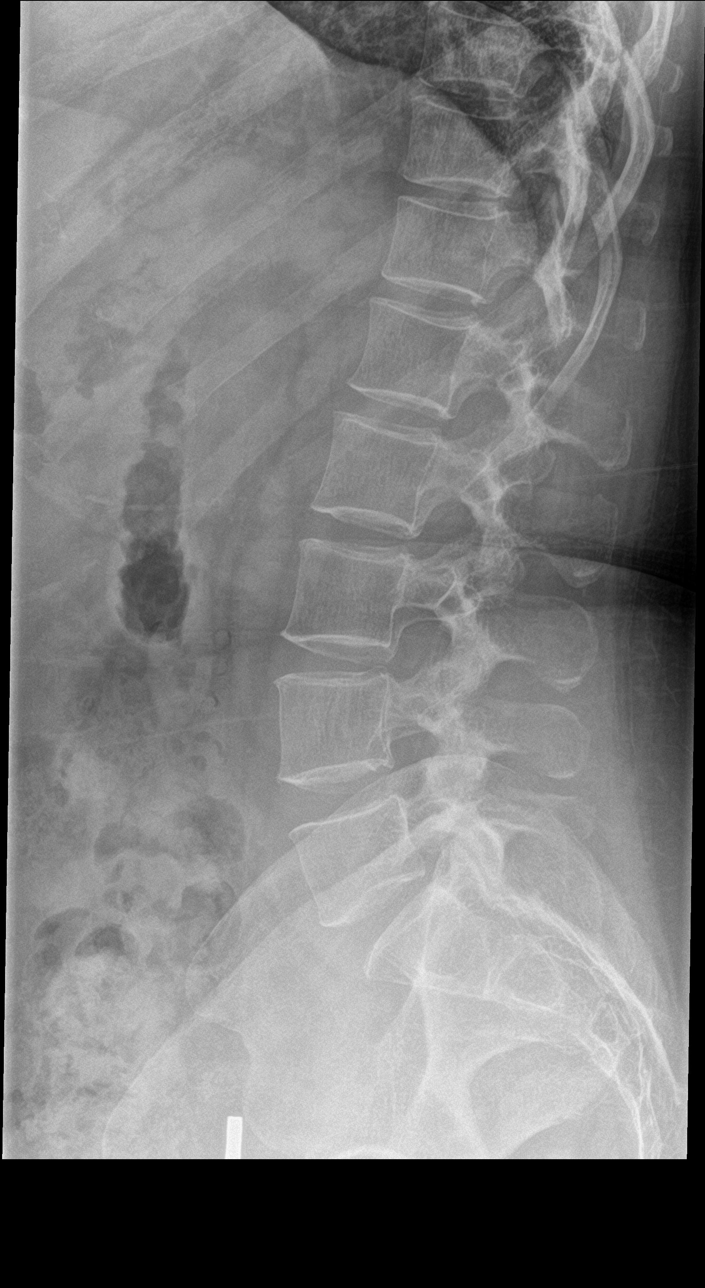

[l-spine spot]
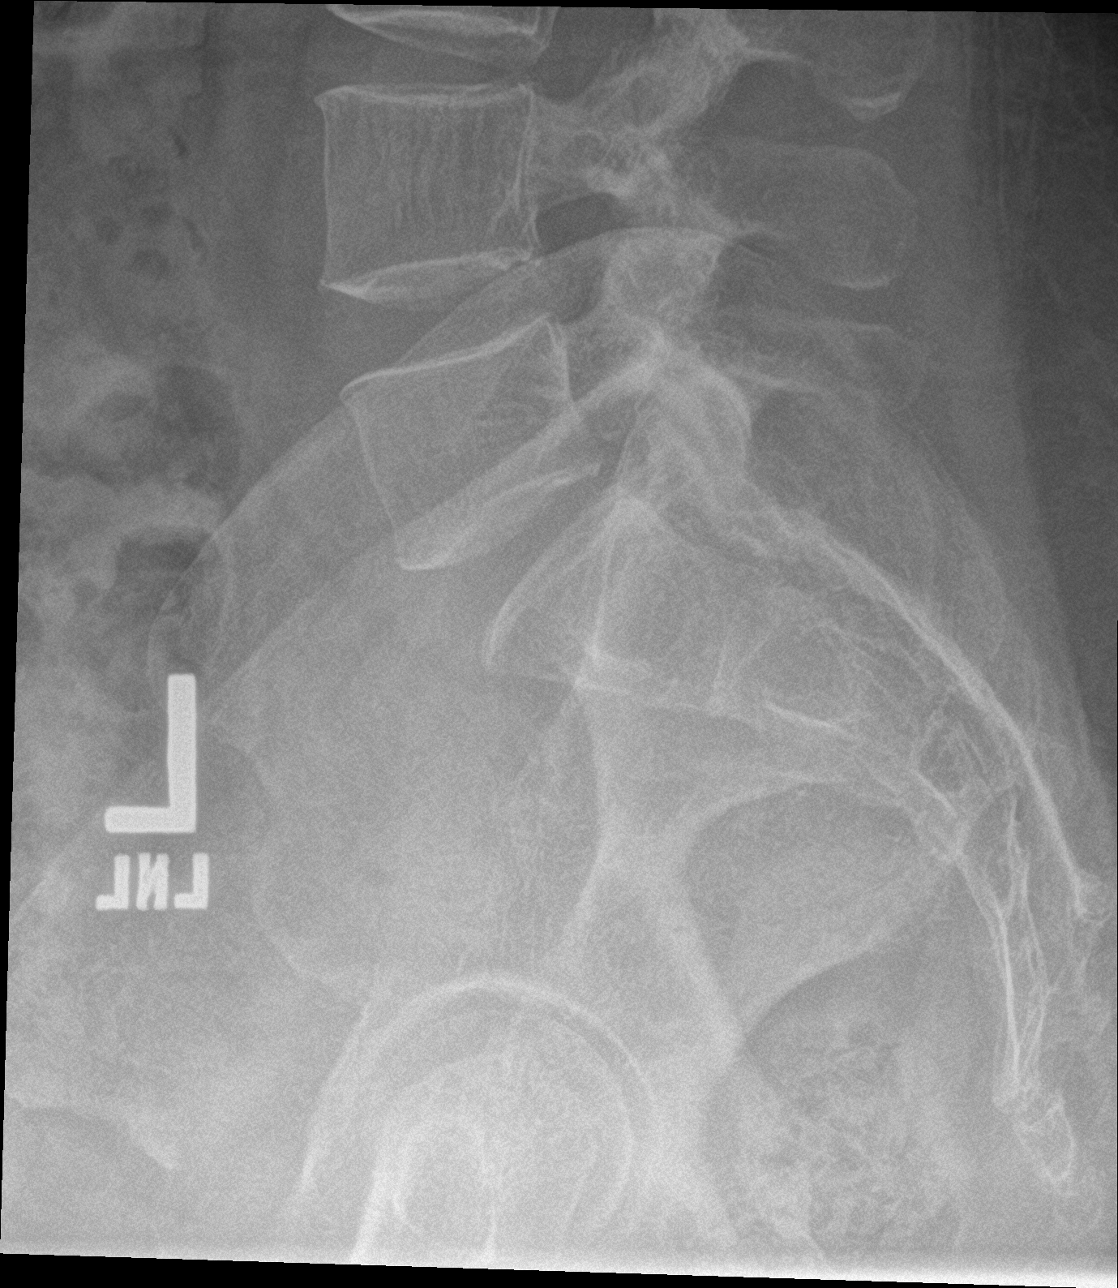

[5 of 5 positions shown; findings below may reference images not displayed]

FINDINGS: No acute bony abnormality identified. Normal alignment
mineralization. Diffuse osteopenia .
IMPRESSION: No acute bony abnormality.  Normal alignment and mineralization.

## 2018-12-06 MED FILL — CELECOXIB 200 MG CAP: 200 | 30 days supply | Qty: 60 | Fill #4

## 2018-12-06 MED FILL — METFORMIN HCL ER 500 MG TAB: 500 | 30 days supply | Qty: 30 | Fill #4

## 2018-12-06 MED FILL — ?PANTOPRAZOLE SOD DR 40MGTA: 40 | 30 days supply | Qty: 30 | Fill #1

## 2018-12-25 ENCOUNTER — Other Ambulatory Visit: Payer: Self-pay

## 2018-12-25 ENCOUNTER — Ambulatory Visit: Payer: Self-pay | Attending: Primary Care | Admitting: Primary Care

## 2019-01-09 MED FILL — ?PRAVASTATIN NA 20MG TABL: 20 | 90 days supply | Qty: 90 | Fill #1

## 2019-01-09 MED FILL — CELECOXIB 200 MG CAP: 200 | 30 days supply | Qty: 60 | Fill #5

## 2019-01-09 MED FILL — ?PANTOPRAZOLE SOD DR 40MGTA: 40 | 30 days supply | Qty: 30 | Fill #2

## 2019-01-09 MED FILL — METFORMIN HCL ER 500 MG TAB: 500 | 30 days supply | Qty: 30 | Fill #5

## 2019-01-14 ENCOUNTER — Other Ambulatory Visit (HOSPITAL_COMMUNITY): Admission: RE | Admit: 2019-01-14 | Payer: No Typology Code available for payment source | Source: Ambulatory Visit

## 2019-01-16 ENCOUNTER — Encounter (HOSPITAL_BASED_OUTPATIENT_CLINIC_OR_DEPARTMENT_OTHER): Payer: Self-pay

## 2019-02-18 MED FILL — ?PANTOPRAZOLE SO DR 40MG TA: 40 | 30 days supply | Qty: 30 | Fill #3

## 2019-02-18 MED FILL — ?PRAVASTATIN NA 20MG TABL: 20 | 90 days supply | Qty: 90 | Fill #2

## 2019-02-18 MED FILL — metFORMIN HCL ER 500 MG TB2: 500 | 30 days supply | Qty: 30 | Fill #6

## 2019-02-20 ENCOUNTER — Ambulatory Visit: Payer: Self-pay | Attending: Family Medicine

## 2019-02-20 ENCOUNTER — Other Ambulatory Visit: Payer: Self-pay

## 2019-02-26 ENCOUNTER — Ambulatory Visit (INDEPENDENT_AMBULATORY_CARE_PROVIDER_SITE_OTHER): Payer: Self-pay | Admitting: Primary Care

## 2019-02-26 ENCOUNTER — Encounter (INDEPENDENT_AMBULATORY_CARE_PROVIDER_SITE_OTHER): Payer: Self-pay | Admitting: Primary Care

## 2019-02-26 ENCOUNTER — Other Ambulatory Visit: Payer: Self-pay

## 2019-02-26 VITALS — BP 143/87 | HR 76 | Temp 98.5°F | Ht 62.0 in | Wt 192.0 lb

## 2019-02-26 DIAGNOSIS — E782 Mixed hyperlipidemia: Secondary | ICD-10-CM

## 2019-02-26 DIAGNOSIS — F17210 Nicotine dependence, cigarettes, uncomplicated: Secondary | ICD-10-CM

## 2019-02-26 DIAGNOSIS — E119 Type 2 diabetes mellitus without complications: Secondary | ICD-10-CM

## 2019-02-26 DIAGNOSIS — I10 Essential (primary) hypertension: Secondary | ICD-10-CM

## 2019-02-26 DIAGNOSIS — R21 Rash and other nonspecific skin eruption: Secondary | ICD-10-CM

## 2019-02-26 LAB — POCT GLYCOSYLATED HEMOGLOBIN (HGB A1C): Hemoglobin A1C: 6.7 % — AB (ref 4.0–5.6)

## 2019-02-26 LAB — GLUCOSE, POCT (MANUAL RESULT ENTRY): POC Glucose: 135 mg/dl — AB (ref 70–99)

## 2019-02-26 MED ORDER — TRIAMCINOLONE ACETONIDE 0.1 % EX CREA: 1.0000 "application " | TOPICAL_CREAM | Freq: Two times a day (BID) | CUTANEOUS | 0 refills | Status: DC

## 2019-02-26 MED ORDER — PRAVASTATIN SODIUM 20 MG PO TABS: 20.0000 mg | ORAL_TABLET | Freq: Every day | ORAL | 3 refills | Status: DC

## 2019-02-26 MED ORDER — LISINOPRIL 20 MG PO TABS: 10.0000 mg | ORAL_TABLET | Freq: Every day | ORAL | 3 refills | Status: DC

## 2019-02-26 MED ORDER — METFORMIN HCL ER 500 MG PO TB24: 500.0000 mg | ORAL_TABLET | Freq: Two times a day (BID) | ORAL | 3 refills | Status: DC

## 2019-02-26 MED FILL — LISINOPRIL 20 MG TABLET: 20 | 30 days supply | Qty: 15 | Fill #0

## 2019-02-26 MED FILL — METFORMIN HCL ER 500 MG TB2: 500 | 30 days supply | Qty: 60 | Fill #0

## 2019-02-26 MED FILL — ?TRIAMCINOLONE 0.1% CRM: 0.1 | 15 days supply | Qty: 30 | Fill #0

## 2019-02-26 MED FILL — ?PRAVASTATIN NA 20MG TABL: 20 | 90 days supply | Qty: 90 | Fill #0

## 2019-02-26 NOTE — Progress Notes (Signed)
Acute Office Visit  Subjective:    [name], female    DOB: 1961/03/26, 58 y.o.   MRN: [id number]  Chief Complaint  Patient presents with  . Diabetes    needs refill of metformin  . Pruritis    bilateral lower arms   . Referral    dental     HPI Patient is in today for a acute visit she presents for only arms and scratches in her sleep. She denies changing soaps, deodorants , laundry detergent and she doe not have pets nor has she eaten any different foods. She denies shortness of breath, headaches, chest pain or lower extremity edema  Past Medical History:  Diagnosis Date  . Hemorrhoids   . Hyperlipidemia   . Pre-diabetes     Past Surgical History:  Procedure Laterality Date  . CESAREAN SECTION     3 previous  . COLONOSCOPY WITH PROPOFOL N/A 11/14/2017   Procedure: COLONOSCOPY WITH PROPOFOL;  Surgeon: Jonathon Bellows, MD;  Location: [facility] ENDOSCOPY;  Service: Gastroenterology;  Laterality: N/A;  . COLONOSCOPY WITH PROPOFOL N/A 01/22/2018   Procedure: COLONOSCOPY WITH PROPOFOL;  Surgeon: Jonathon Bellows, MD;  Location: [facility] ENDOSCOPY;  Service: Gastroenterology;  Laterality: N/A;  . TUBAL LIGATION      Family History  Problem Relation Age of Onset  . Diabetes Mother   . Hypertension Mother   . Diabetes Father     Social History   Socioeconomic History  . Marital status: Married    Spouse name: Not on file  . Number of children: 3  . Years of education: Not on file  . Highest education level: 10th grade  Occupational History  . Not on file  Social Needs  . Financial resource strain: Not on file  . Food insecurity    Worry: Not on file    Inability: Not on file  . Transportation needs    Medical: No    Non-medical: No  Tobacco Use  . Smoking status: Current Every Day Smoker    Types: Cigarettes  . Smokeless tobacco: Never Used  . Tobacco comment: 3 a day  Substance and Sexual Activity  . Alcohol use: No    Frequency: Never  . Drug  use: No  . Sexual activity: Yes  Lifestyle  . Physical activity    Days per week: 7 days    Minutes per session: 90 min  . Stress: Very much  Relationships  . Social Herbalist on phone: Never    Gets together: Once a week    Attends religious service: More than 4 times per year    Active member of club or organization: No    Attends meetings of clubs or organizations: Never    Relationship status: Married  . Intimate partner violence    Fear of current or ex partner: No    Emotionally abused: No    Physically abused: No    Forced sexual activity: No  Other Topics Concern  . Not on file  Social History Narrative  . Not on file    Outpatient Medications Prior to Visit  Medication Sig Dispense Refill  . aspirin EC 81 MG tablet Take 81 mg by mouth daily.    . pantoprazole (PROTONIX) 40 MG tablet Take 1 tablet (40 mg total) by mouth daily. 30 tablet 3  . metFORMIN (GLUCOPHAGE-XR) 500 MG 24 hr tablet Take 1 tablet (500 mg total) by mouth daily with breakfast. 30 tablet 6  .  celecoxib (CELEBREX) 200 MG capsule Take 1 capsule (200 mg total) by mouth 2 (two) times daily. 60 capsule 6  . pravastatin (PRAVACHOL) 20 MG tablet Take 1 tablet (20 mg total) by mouth daily. (Patient not taking: Reported on 02/26/2019) 90 tablet 3   No facility-administered medications prior to visit.     No Known Allergies  Review of Systems  HENT: Positive for ear pain.   Skin: Positive for itching and rash.  Psychiatric/Behavioral: The patient has insomnia.        Specifically at night when she wakes up to scratch   All other systems reviewed and are negative.      Objective:    Physical Exam  Constitutional: She is oriented to person, place, and time. She appears well-developed and well-nourished.  HENT:  Head: Normocephalic.  Eyes: EOM are normal.  Neck: Normal range of motion. Neck supple.  Cardiovascular: Normal rate and regular rhythm.  Pulmonary/Chest: Effort normal and  breath sounds normal.  Abdominal: Soft. Bowel sounds are normal.  Musculoskeletal: Normal range of motion.  Neurological: She is oriented to person, place, and time.  Skin: Skin is warm and dry.  Psychiatric: She has a normal mood and affect.    BP (!) 143/87 (BP Location: Right Arm, Patient Position: Sitting, Cuff Size: Large)   Pulse 76   Temp 98.5 F (36.9 C) (Tympanic)   Ht _0  (1.575 m)   Wt 192 lb (87.1 kg)   SpO2 94%   BMI 35.12 kg/m  Wt Readings from Last 3 Encounters:  02/26/19 192 lb (87.1 kg)  10/23/18 192 lb 3.2 oz (87.2 kg)  10/16/18 194 lb (88 kg)    There are no preventive care reminders to display for this patient.  There are no preventive care reminders to display for this patient.   Lab Results  Component Value Date   TSH 2.200 04/14/2017   Lab Results  Component Value Date   WBC 8.2 07/03/2018   HGB 13.9 07/03/2018   HCT 41.2 07/03/2018   MCV 90 07/03/2018   PLT 332 07/03/2018   Lab Results  Component Value Date   NA 141 09/27/2018   K 4.4 09/27/2018   CO2 25 09/27/2018   GLUCOSE 114 (H) 09/27/2018   BUN 14 09/27/2018   CREATININE 0.80 09/27/2018   BILITOT 0.3 09/27/2018   ALKPHOS 85 09/27/2018   AST 24 09/27/2018   ALT 33 (H) 09/27/2018   PROT 7.1 09/27/2018   ALBUMIN 4.1 09/27/2018   CALCIUM 9.5 09/27/2018   Lab Results  Component Value Date   CHOL 179 09/27/2018   Lab Results  Component Value Date   HDL 56 09/27/2018   Lab Results  Component Value Date   LDLCALC 95 09/27/2018   Lab Results  Component Value Date   TRIG 142 09/27/2018   Lab Results  Component Value Date   CHOLHDL 3.2 09/27/2018   Lab Results  Component Value Date   HGBA1C 6.7 (A) 02/26/2019       Assessment & Plan:   Meds ordered this encounter  Medications  . metFORMIN (GLUCOPHAGE-XR) 500 MG 24 hr tablet    Sig: Take 1 tablet (500 mg total) by mouth 2 (two) times daily after a meal.    Dispense:  60 tablet    Refill:  3  . lisinopril  (ZESTRIL) 20 MG tablet    Sig: Take 0.5 tablets (10 mg total) by mouth daily.    Dispense:  30 tablet  Refill:  3  . pravastatin (PRAVACHOL) 20 MG tablet    Sig: Take 1 tablet (20 mg total) by mouth daily.    Dispense:  90 tablet    Refill:  3  . triamcinolone cream (KENALOG) 0.1 %    Sig: Apply 1 application topically 2 (two) times daily.    Dispense:  30 g    Refill:  0   Avanell was seen today for diabetes, pruritis and referral.  Diagnoses and all orders for this visit:  Type 2 diabetes mellitus without complication, without long-term current use of insulin ([place]) ADA recommends the following therapeutic goals for glycemic control related to A1c measurements: Goal of therapy: Less than 6.5 hemoglobin A1c.  Reference clinical practice recommendations. Foods that are high in carbohydrates are the following rice, potatoes, breads, sugars, and pastas.  Reduction in the intake (eating) will assist in lowering your blood sugars. A1C 6.7 increased metformin 560m XR to twice daily  -     Glucose (CBG) -     HgB A1c -     CBC with Differential; Future -     CMP14+EGFR; Future  Essential hypertension Lisinopril 245mevery morning for Bp control and renal protection  Counseled on blood pressure goal of less than 130/80, low-sodium, DASH diet, medication compliance, 150 minutes of moderate intensity exercise per week. Discussed medication compliance, adverse effects. -     CMP14+EGFR; Future  Mixed hyperlipidemia  Healthy lifestyle diet of fruits vegetables fish nuts whole grains and low saturated fat . Foods high in cholesterol or liver, fatty meats,cheese, butter avocados, nuts and seeds, chocolate and fried foods. -     Lipid Panel; Future  Rash and nonspecific skin eruption   triamcinolone cream (KENALOG) 0.1 %; Apply 1 application topically 2 (two) times daily.  Other orders -     metFORMIN (GLUCOPHAGE-XR) 500 MG 24 hr tablet; Take 1 tablet (500 mg total) by mouth 2 (two) times  daily after a meal. -     lisinopril (ZESTRIL) 20 MG tablet; Take 0.5 tablets (10 mg total) by mouth daily. -     pravastatin (PRAVACHOL) 20 MG tablet; Take 1 tablet (20 mg total) by mouth daily.   MiKerin PernaNP

## 2019-02-26 NOTE — Progress Notes (Signed)
Pt complains that the itching keeps her from sleeping at night.

## 2019-02-26 NOTE — Patient Instructions (Signed)
Hipertensin en los adultos Hypertension, Adult El trmino hipertensin es otra forma de denominar a la presin arterial elevada. La presin arterial elevada fuerza al corazn a trabajar ms para bombear la sangre. Esto puede causar problemas con el paso del tiempo. Una lectura de presin arterial est compuesta por 2 nmeros. Hay un nmero superior (sistlico) sobre un nmero inferior (diastlico). Lo ideal es tener la presin arterial por debajo de 120/80. Las elecciones saludables pueden ayudar a bajar la presin arterial, o tal vez necesite medicamentos para bajarla. Cules son las causas? Se desconoce la causa de esta afeccin. Algunas afecciones pueden estar relacionadas con la presin arterial alta. Qu incrementa el riesgo?  Fumar.  Tener diabetes mellitus tipo 2, colesterol alto, o ambos.  No hacer la cantidad suficiente de actividad fsica o ejercicio.  Tener sobrepeso.  Consumir mucha grasa, azcar, caloras o sal (sodio) en su dieta.  Beber alcohol en exceso.  Tener una enfermedad renal a largo plazo (crnica).  Tener antecedentes familiares de presin arterial alta.  Edad. Los riesgos aumentan con la edad.  Raza. El riesgo es mayor para las personas afroamericanas.  Sexo. Antes de los 45aos, los hombres corren ms riesgo que las mujeres. Despus de los 65aos, las mujeres corren ms riesgo que los hombres.  Tener apnea obstructiva del sueo.  Estrs. Cules son los signos o los sntomas?  Es posible que la presin arterial alta puede no cause sntomas. La presin arterial muy alta (crisis hipertensiva) puede provocar: ? Dolor de cabeza. ? Sensaciones de preocupacin o nerviosismo (ansiedad). ? Falta de aire. ? Hemorragia nasal. ? Sensacin de malestar en el estmago (nuseas). ? Vmitos. ? Cambios en la forma de ver. ? Dolor muy intenso en el pecho. ? Convulsiones. Cmo se trata?  Esta afeccin se trata haciendo cambios saludables en el estilo de  vida, por ejemplo: ? Consumir alimentos saludables. ? Hacer ms ejercicio. ? Beber menos alcohol.  El mdico puede recetarle medicamentos si los cambios en el estilo de vida no son suficientes para lograr controlar la presin arterial y si: ? El nmero de arriba est por encima de 130. ? El nmero de abajo est por encima de 80.  Su presin arterial personal ideal puede variar. Siga estas instrucciones en su casa: Comida y bebida   Si se lo dicen, siga el plan de alimentacin de DASH (Dietary Approaches to Stop Hypertension, Maneras de alimentarse para detener la hipertensin). Para seguir este plan: ? Llene la mitad del plato de cada comida con frutas y verduras. ? Llene un cuarto del plato de cada comida con cereales integrales. Los cereales integrales incluyen pasta integral, arroz integral y pan integral. ? Coma y beba productos lcteos con bajo contenido de grasa, como leche descremada o yogur bajo en grasas. ? Llene un cuarto del plato de cada comida con protenas bajas en grasa (magras). Las protenas bajas en grasa incluyen pescado, pollo sin piel, huevos, frijoles y tofu. ? Evite consumir carne grasa, carne curada y procesada, o pollo con piel. ? Evite consumir alimentos prehechos o procesados.  Consuma menos de 1500 mg de sal por da.  No beba alcohol si: ? El mdico le indica que no lo haga. ? Est embarazada, puede estar embarazada o est tratando de quedar embarazada.  Si bebe alcohol: ? Limite la cantidad que bebe a lo siguiente:  De 0 a 1 medida por da para las mujeres.  De 0 a 2 medidas por da para los hombres. ? Est atento a   la cantidad de alcohol que hay en las bebidas que toma. En los [place], una medida equivale a una botella de cerveza de 12oz (355ml), un vaso de vino de 5oz (148ml) o un vaso de una bebida alcohlica de alta graduacin de 1oz (44ml). Estilo de vida   Trabaje con su mdico para mantenerse en un peso saludable o para perder  peso. Pregntele a su mdico cul es el peso recomendable para usted.  Haga al menos 30minutos de ejercicio la mayora de los das de la semana. Estos pueden incluir caminar, nadar o andar en bicicleta.  Realice al menos 30 minutos de ejercicio que fortalezca sus msculos (ejercicios de resistencia) al menos 3 das a la semana. Estos pueden incluir levantar pesas o hacer Pilates.  No consuma ningn producto que contenga nicotina o tabaco, como cigarrillos, cigarrillos electrnicos y tabaco de mascar. Si necesita ayuda para dejar de fumar, consulte al mdico.  Controle su presin arterial en su casa tal como le indic el mdico.  Concurra a todas las visitas de seguimiento como se lo haya indicado el mdico. Esto es importante. Medicamentos  Tome los medicamentos de venta libre y los recetados solamente como se lo haya indicado el mdico. Siga cuidadosamente las indicaciones.  No omita las dosis de medicamentos para la presin arterial. Los medicamentos pierden eficacia si omite dosis. El hecho de omitir las dosis tambin aumenta el riesgo de otros problemas.  Pregntele a su mdico a qu efectos secundarios o reacciones a los medicamentos debe prestar atencin. Comunquese con un mdico si:  Piensa que tiene una reaccin a los medicamentos que est tomando.  Tiene dolores de cabeza frecuentes (recurrentes).  Se siente mareado.  Tiene hinchazn en los tobillos.  Tiene problemas de visin. Solicite ayuda inmediatamente si:  Siente un dolor de cabeza muy intenso.  Empieza a sentirse desorientado (confundido).  Se siente dbil o adormecido.  Siente que va a desmayarse.  Tiene un dolor muy intenso en las siguientes zonas: ? Pecho. ? Vientre (abdomen).  Vomita ms de una vez.  Tiene dificultad para respirar. Resumen  El trmino hipertensin es otra forma de denominar a la presin arterial elevada.  La presin arterial elevada fuerza al corazn a trabajar ms para bombear  la sangre.  Para la mayora de las personas, una presin arterial normal es menor que 120/80.  Las decisiones saludables pueden ayudarle a disminuir su presin arterial. Si no puede bajar su presin arterial mediante decisiones saludables, es posible que deba tomar medicamentos. Esta informacin no tiene como fin reemplazar el consejo del mdico. Asegrese de hacerle al mdico cualquier pregunta que tenga. Document Released: 01/19/2010 Document Revised: 05/17/2018 Document Reviewed: 05/17/2018 Elsevier Patient Education  2020 Elsevier Inc.  

## 2019-03-08 ENCOUNTER — Other Ambulatory Visit (HOSPITAL_COMMUNITY)
Admission: RE | Admit: 2019-03-08 | Discharge: 2019-03-08 | Disposition: A | Discharge status: Home / Self Care | Payer: No Typology Code available for payment source | Source: Ambulatory Visit | Attending: Internal Medicine | Admitting: Internal Medicine

## 2019-03-08 DIAGNOSIS — Z1159 Encounter for screening for other viral diseases: Secondary | ICD-10-CM | POA: Insufficient documentation

## 2019-03-09 LAB — SARS CORONAVIRUS 2 (TAT 6-24 HRS): SARS Coronavirus 2: NEGATIVE

## 2019-03-11 ENCOUNTER — Other Ambulatory Visit: Payer: Self-pay

## 2019-03-11 ENCOUNTER — Ambulatory Visit (HOSPITAL_BASED_OUTPATIENT_CLINIC_OR_DEPARTMENT_OTHER): Payer: Self-pay | Attending: Family Medicine | Admitting: Internal Medicine

## 2019-03-11 DIAGNOSIS — G4733 Obstructive sleep apnea (adult) (pediatric): Secondary | ICD-10-CM | POA: Insufficient documentation

## 2019-03-12 ENCOUNTER — Other Ambulatory Visit: Payer: Self-pay

## 2019-03-13 ENCOUNTER — Other Ambulatory Visit (INDEPENDENT_AMBULATORY_CARE_PROVIDER_SITE_OTHER): Payer: Self-pay | Admitting: Primary Care

## 2019-03-13 DIAGNOSIS — G4733 Obstructive sleep apnea (adult) (pediatric): Secondary | ICD-10-CM

## 2019-03-13 NOTE — Procedures (Signed)
Patient Name: Frances Martin, [place] Study Date: 03/11/2019 Gender: Female D.O.B: 1960-12-10 Age (years): 58 Referring Provider: Clent Demark PA Height (inches): 62 Interpreting Physician: Baird Lyons MD, ABSM Weight (lbs): 190 RPSGT: Carolin Coy BMI: 35 MRN: [id number] Neck Size: 16.00  CLINICAL INFORMATION The patient is referred for a CPAP titration to treat sleep apnea.  Date of NPSG, Split Night or HST: NPSG 08/18/2018  AHI 15.5/ hr, desaturation to 70%, body weight 180 lbs  SLEEP STUDY TECHNIQUE As per the AASM Manual for the Scoring of Sleep and Associated Events v2.3 (April 2016) with a hypopnea requiring 4% desaturations.  The channels recorded and monitored were frontal, central and occipital EEG, electrooculogram (EOG), submentalis EMG (chin), nasal and oral airflow, thoracic and abdominal wall motion, anterior tibialis EMG, snore microphone, electrocardiogram, and pulse oximetry. Continuous positive airway pressure (CPAP) was initiated at the beginning of the study and titrated to treat sleep-disordered breathing.  MEDICATIONS Medications self-administered by patient taken the night of the study : none reported  TECHNICIAN COMMENTS Comments added by technician: PATIENT WAS ORDERED AS A CPAP TITRATION. Comments added by scorer: N/A  RESPIRATORY PARAMETERS Optimal PAP Pressure (cm): 15 AHI at Optimal Pressure (/hr): 0.0 Overall Minimal O2 (%): 91.0 Supine % at Optimal Pressure (%): 100 Minimal O2 at Optimal Pressure (%): 94.0   SLEEP ARCHITECTURE The study was initiated at 10:14:06 PM and ended at 4:28:34 AM.  Sleep onset time was 30.2 minutes and the sleep efficiency was 70.4%%. The total sleep time was 263.5 minutes.  The patient spent 11.0%% of the night in stage N1 sleep, 67.6%% in stage N2 sleep, 0.4%% in stage N3 and 21.1% in REM.Stage REM latency was 64.5 minutes  Wake after sleep onset was 80.8. Alpha intrusion was absent. Supine sleep was  23.97%.  CARDIAC DATA The 2 lead EKG demonstrated sinus rhythm. The mean heart rate was 69.2 beats per minute. Other EKG findings include: None.  LEG MOVEMENT DATA The total Periodic Limb Movements of Sleep (PLMS) were 0. The PLMS index was 0.0. A PLMS index of <15 is considered normal in adults.  IMPRESSIONS - The optimal PAP pressure was 15 cm of water. - Central sleep apnea was not noted during this titration (CAI = 0.5/h). - Significant oxygen desaturations were not observed during this titration (min O2 = 91.0%). - The patient snored with moderate snoring volume during this titration study. - No cardiac abnormalities were observed during this study. - Clinically significant periodic limb movements were not noted during this study. Arousals associated with PLMs were rare.  DIAGNOSIS - Obstructive Sleep Apnea (327.23 [G47.33 ICD-10])  RECOMMENDATIONS - Trial of CPAP therapy on 15 cm H2O or autopap 10-20. - Patient used a Small size Fisher&Paykel Full Face Mask F&P Vitera (new) mask and heated humidification. - Be careful with alcohol, sedatives and other CNS depressants that may worsen sleep apnea and disrupt normal sleep architecture. - Sleep hygiene should be reviewed to assess factors that may improve sleep quality. - Weight management and regular exercise should be initiated or continued  [Electronically signed] 03/13/2019 02:00 PM  Baird Lyons MD, [place], American Board of Sleep Medicine   NPI: [id number]                        [place], [place] [facility]  ELECTRONICALLY SIGNED ON:  03/13/2019, 1:58 PM [place]: [phone]   FX: [phone]  [place] [facility]

## 2019-03-26 ENCOUNTER — Other Ambulatory Visit: Payer: Self-pay

## 2019-03-26 ENCOUNTER — Encounter (INDEPENDENT_AMBULATORY_CARE_PROVIDER_SITE_OTHER): Payer: Self-pay | Admitting: Primary Care

## 2019-03-26 ENCOUNTER — Ambulatory Visit (INDEPENDENT_AMBULATORY_CARE_PROVIDER_SITE_OTHER): Payer: Self-pay | Admitting: Primary Care

## 2019-03-26 VITALS — BP 130/81 | HR 72 | Temp 97.7°F | Ht 62.0 in | Wt 192.4 lb

## 2019-03-26 DIAGNOSIS — E782 Mixed hyperlipidemia: Secondary | ICD-10-CM

## 2019-03-26 DIAGNOSIS — F17210 Nicotine dependence, cigarettes, uncomplicated: Secondary | ICD-10-CM

## 2019-03-26 DIAGNOSIS — R21 Rash and other nonspecific skin eruption: Secondary | ICD-10-CM

## 2019-03-26 DIAGNOSIS — I10 Essential (primary) hypertension: Secondary | ICD-10-CM

## 2019-03-26 DIAGNOSIS — E119 Type 2 diabetes mellitus without complications: Secondary | ICD-10-CM

## 2019-03-26 DIAGNOSIS — K0889 Other specified disorders of teeth and supporting structures: Secondary | ICD-10-CM

## 2019-03-26 MED ORDER — TRIAMCINOLONE ACETONIDE 0.1 % EX CREA: 1.0000 "application " | TOPICAL_CREAM | Freq: Two times a day (BID) | CUTANEOUS | 0 refills | Status: DC

## 2019-03-26 MED ORDER — LISINOPRIL 20 MG PO TABS: 10.0000 mg | ORAL_TABLET | Freq: Every day | ORAL | 3 refills | Status: DC

## 2019-03-26 MED ORDER — METFORMIN HCL ER 500 MG PO TB24: 500.0000 mg | ORAL_TABLET | Freq: Two times a day (BID) | ORAL | 3 refills | Status: DC

## 2019-03-26 MED ORDER — PRAVASTATIN SODIUM 20 MG PO TABS: 20.0000 mg | ORAL_TABLET | Freq: Every day | ORAL | 3 refills | Status: DC

## 2019-03-26 MED FILL — LISINOPRIL 20 MG TABLET: 20 | 30 days supply | Qty: 15 | Fill #0

## 2019-03-26 MED FILL — ?TRIAMCINOLONE 0.1% CRM: 0.1 | 15 days supply | Qty: 30 | Fill #0

## 2019-03-26 MED FILL — METFORMIN HCL ER 500 MG TB2: 500 | 30 days supply | Qty: 60 | Fill #0

## 2019-03-26 NOTE — Progress Notes (Signed)
Pt complains of dental pain from an abcess. States she has been taking naproxen and an antibiotic. The abx is expired.

## 2019-03-26 NOTE — Progress Notes (Signed)
Established Patient Office Visit  Subjective:  [name], female    DOB: December 01, 1960  Age: 58 y.o. MRN: [id number]  CC:  Chief Complaint  Patient presents with  . Follow-up    BP check on new medications     HPI Frances Martin presents for blood pressure re-evaluation. It has improved she is fasting for blood work. She denies shortness of breath, headaches, chest pain or lower extremity edema.   Past Medical History:  Diagnosis Date  . Hemorrhoids   . Hyperlipidemia   . Pre-diabetes     Past Surgical History:  Procedure Laterality Date  . CESAREAN SECTION     3 previous  . COLONOSCOPY WITH PROPOFOL N/A 11/14/2017   Procedure: COLONOSCOPY WITH PROPOFOL;  Surgeon: Jonathon Bellows, MD;  Location: [facility] ENDOSCOPY;  Service: Gastroenterology;  Laterality: N/A;  . COLONOSCOPY WITH PROPOFOL N/A 01/22/2018   Procedure: COLONOSCOPY WITH PROPOFOL;  Surgeon: Jonathon Bellows, MD;  Location: [facility] ENDOSCOPY;  Service: Gastroenterology;  Laterality: N/A;  . TUBAL LIGATION      Family History  Problem Relation Age of Onset  . Diabetes Mother   . Hypertension Mother   . Diabetes Father     Social History   Socioeconomic History  . Marital status: Married    Spouse name: Not on file  . Number of children: 3  . Years of education: Not on file  . Highest education level: 10th grade  Occupational History  . Not on file  Social Needs  . Financial resource strain: Not on file  . Food insecurity    Worry: Not on file    Inability: Not on file  . Transportation needs    Medical: No    Non-medical: No  Tobacco Use  . Smoking status: Current Every Day Smoker    Types: Cigarettes  . Smokeless tobacco: Never Used  . Tobacco comment: 3 a day  Substance and Sexual Activity  . Alcohol use: No    Frequency: Never  . Drug use: No  . Sexual activity: Yes  Lifestyle  . Physical activity    Days per week: 7 days    Minutes per session: 90 min  . Stress: Very  much  Relationships  . Social Herbalist on phone: Never    Gets together: Once a week    Attends religious service: More than 4 times per year    Active member of club or organization: No    Attends meetings of clubs or organizations: Never    Relationship status: Married  . Intimate partner violence    Fear of current or ex partner: No    Emotionally abused: No    Physically abused: No    Forced sexual activity: No  Other Topics Concern  . Not on file  Social History Narrative  . Not on file    Outpatient Medications Prior to Visit  Medication Sig Dispense Refill  . aspirin EC 81 MG tablet Take 81 mg by mouth daily.    . pantoprazole (PROTONIX) 40 MG tablet Take 1 tablet (40 mg total) by mouth daily. 30 tablet 3  . lisinopril (ZESTRIL) 20 MG tablet Take 0.5 tablets (10 mg total) by mouth daily. 30 tablet 3  . metFORMIN (GLUCOPHAGE-XR) 500 MG 24 hr tablet Take 1 tablet (500 mg total) by mouth 2 (two) times daily after a meal. 60 tablet 3  . pravastatin (PRAVACHOL) 20 MG tablet Take 1 tablet (20 mg total) by mouth  daily. 90 tablet 3  . triamcinolone cream (KENALOG) 0.1 % Apply 1 application topically 2 (two) times daily. 30 g 0  . celecoxib (CELEBREX) 200 MG capsule Take 1 capsule (200 mg total) by mouth 2 (two) times daily. 60 capsule 6   No facility-administered medications prior to visit.     No Known Allergies  ROS Review of Systems  HENT: Positive for dental problem.   Skin: Positive for rash.  Allergic/Immunologic: Positive for environmental allergies.  All other systems reviewed and are negative.     Objective:    Physical Exam  Constitutional: She is oriented to person, place, and time. She appears well-developed and well-nourished.  HENT:  Head: Normocephalic.  Neck: Neck supple.  Cardiovascular: Normal rate and regular rhythm.  Pulmonary/Chest: Effort normal and breath sounds normal.  Abdominal: Soft. Bowel sounds are normal. She exhibits  distension.  Musculoskeletal: Normal range of motion.  Neurological: She is oriented to person, place, and time.  Skin: Rash noted.  Psychiatric: She has a normal mood and affect.    BP 130/81 (BP Location: Right Arm, Patient Position: Sitting, Cuff Size: Normal)   Pulse 72   Temp 97.7 F (36.5 C) (Tympanic)   Ht 5\' 2"  (1.575 m)   Wt 192 lb 6.4 oz (87.3 kg)   SpO2 97%   BMI 35.19 kg/m  Wt Readings from Last 3 Encounters:  03/26/19 192 lb 6.4 oz (87.3 kg)  03/11/19 190 lb (86.2 kg)  02/26/19 192 lb (87.1 kg)     Health Maintenance Due  Topic Date Due  . INFLUENZA VACCINE  03/16/2019    There are no preventive care reminders to display for this patient.  Lab Results  Component Value Date   TSH 2.200 04/14/2017   Lab Results  Component Value Date   WBC 8.2 07/03/2018   HGB 13.9 07/03/2018   HCT 41.2 07/03/2018   MCV 90 07/03/2018   PLT 332 07/03/2018   Lab Results  Component Value Date   NA 141 09/27/2018   K 4.4 09/27/2018   CO2 25 09/27/2018   GLUCOSE 114 (H) 09/27/2018   BUN 14 09/27/2018   CREATININE 0.80 09/27/2018   BILITOT 0.3 09/27/2018   ALKPHOS 85 09/27/2018   AST 24 09/27/2018   ALT 33 (H) 09/27/2018   PROT 7.1 09/27/2018   ALBUMIN 4.1 09/27/2018   CALCIUM 9.5 09/27/2018   Lab Results  Component Value Date   CHOL 179 09/27/2018   Lab Results  Component Value Date   HDL 56 09/27/2018   Lab Results  Component Value Date   LDLCALC 95 09/27/2018   Lab Results  Component Value Date   TRIG 142 09/27/2018   Lab Results  Component Value Date   CHOLHDL 3.2 09/27/2018   Lab Results  Component Value Date   HGBA1C 6.7 (A) 02/26/2019      Assessment & Plan:   Problem List Items Addressed This Visit    None    Visit Diagnoses    Type 2 diabetes mellitus without complication, without long-term current use of insulin (HCC)    -  Primary   Relevant Medications   metFORMIN (GLUCOPHAGE-XR) 500 MG 24 hr tablet   pravastatin (PRAVACHOL)  20 MG tablet   lisinopril (ZESTRIL) 20 MG tablet   Essential hypertension       Relevant Medications   pravastatin (PRAVACHOL) 20 MG tablet   lisinopril (ZESTRIL) 20 MG tablet   Mixed hyperlipidemia       Relevant  Medications   pravastatin (PRAVACHOL) 20 MG tablet   lisinopril (ZESTRIL) 20 MG tablet   Pain in tooth       Relevant Orders   Ambulatory referral to Dentistry   Rash and nonspecific skin eruption         Oanh was seen today for follow-up.  Diagnoses and all orders for this visit:  Type 2 diabetes mellitus without complication, without long-term current use of insulin (HCC) ADA recommends the following therapeutic goals for glycemic control related to A1c measurements: Goal of therapy: Less than 6.5 hemoglobin A1c.  Reference clinical practice recommendations. Foods that are high in carbohydrates are the following rice, potatoes, breads, sugars, and pastas.  Reduction in the intake (eating) will assist in lowering your blood sugars.  Essential hypertension Counseled on blood pressure goal of less than 130/80, low-sodium, DASH diet, medication compliance, 150 minutes of moderate intensity exercise per week. Discussed medication compliance, adverse effects. Improving on lisinopril .  Mixed hyperlipidemia  Foods high in cholesterol or liver, fatty meats,cheese, butter avocados, nuts and seeds, chocolate and fried foods. On pravastin 20mg  at night  Pain in tooth Previously schedule to have tooth repaired but due to the pandemic appointment canceled referred back to the dentist. Right upper tooth next to the molar is sensitive to temperature changes. -     Ambulatory referral to Dentistry  Rash and nonspecific skin eruption requesting refilled on triamcinolone cream. Explain this can cause depigmentation use sparely   Other orders -     metFORMIN (GLUCOPHAGE-XR) 500 MG 24 hr tablet; Take 1 tablet (500 mg total) by mouth 2 (two) times daily after a meal. -      pravastatin (PRAVACHOL) 20 MG tablet; Take 1 tablet (20 mg total) by mouth daily. -     lisinopril (ZESTRIL) 20 MG tablet; Take 0.5 tablets (10 mg total) by mouth daily. -     triamcinolone cream (KENALOG) 0.1 %; Apply 1 application topically 2 (two) times daily.   Meds ordered this encounter  Medications  . metFORMIN (GLUCOPHAGE-XR) 500 MG 24 hr tablet    Sig: Take 1 tablet (500 mg total) by mouth 2 (two) times daily after a meal.    Dispense:  60 tablet    Refill:  3  . pravastatin (PRAVACHOL) 20 MG tablet    Sig: Take 1 tablet (20 mg total) by mouth daily.    Dispense:  90 tablet    Refill:  3  . lisinopril (ZESTRIL) 20 MG tablet    Sig: Take 0.5 tablets (10 mg total) by mouth daily.    Dispense:  30 tablet    Refill:  3  . triamcinolone cream (KENALOG) 0.1 %    Sig: Apply 1 application topically 2 (two) times daily.    Dispense:  30 g    Refill:  0    Follow-up: Return in about 5 months (around 08/26/2019) for HTN, hyperlipdemia and DM.    Grayce SessionsMichelle P , NP

## 2019-03-26 NOTE — Patient Instructions (Signed)
Dolor dental Dental Pain Las causas del dolor dental pueden ser muchas, entre ellas, las siguientes:  Caries.  Infeccin.  La parte interna del diente se llena de pus (absceso).  Lesiones. A veces, la causa del dolor es desconocida. La intensidad del dolor puede variar. Puede ser leve o intenso. Es posible que tenga dolor todo el tiempo o solo cuando:  Engineer, building services.  Est expuesto a temperaturas calientes o fras.  Come alimentos o tomar bebidas con azcar, por ejemplo, gaseosas o caramelos. Siga estas indicaciones en su casa: Medicamentos  [employer] de venta libre y los recetados solamente como se lo haya indicado el mdico.  Si le recetaron un antibitico, tmelo como se lo haya indicado el mdico. No deje de tomar los medicamentos aunque comience a Sports administrator. Comida y bebida  No consuma los alimentos o las bebidas que le causen Social research officer, government. Estos incluyen: ? Alimentos o bebidas muy calientes o muy fros. ? Alimentos o bebidas dulces o con azcar. Control del dolor y la hinchazn   Haga grgaras con una mezcla de agua y sal 3 o 4veces al Training and development officer. Para prepararla, disuelva de media a 1cucharadita de sal en 1taza de agua tibia.  Si se lo indican, aplique hielo sobre la zona dolorida de la cara: ? Ponga el hielo en una bolsa plstica. ? Coloque una [employer] piel y la bolsa de hielo. ? Coloque el hielo durante 45minutos, 2 a 3veces por da. Cepillarse los dientes  [place] sus [employer] veces al da con dentfrico con fluoruro.  Use hilo dental una vez por da.  Use un a dentfrico para dientes sensible como se lo haya indicado el mdico.  Use un cepillo de cerdas suaves. Indicaciones generales  No se aplique calor en la parte externa de la cara.  Controle Scientist, research (medical). Informe a su mdico si se produce algn cambio.  Concurra a todas las visitas de seguimiento como se lo haya indicado el mdico. Esto es importante. Comunquese con un mdico si:   El dolor no se alivia con los [employer].  Aparecen nuevos sntomas.  Los sntomas empeoran. Solicite ayuda de inmediato si:  No puede abrir Equities trader.  Tiene problemas para respirar o tragar.  Tiene fiebre.  Tiene hinchazn en la cara, el cuello o la mandbula. Resumen  Las causas del dolor dental pueden ser [place], entre ellas, [place] caries, una lesin o una infeccin. En algunos casos, se desconoce la causa.  El dolor puede ser leve o intenso. Puede sentir dolor todo el tiempo o solo cuando come o bebe.  Tome los medicamentos de venta libre y los recetados solamente como se lo haya indicado el mdico.  Scientist, physiological a fin de Recruitment consultant cambio. Informe al mdico si los sntomas empeoran. Esta informacin no tiene Marine scientist el consejo del mdico. Asegrese de hacerle al mdico cualquier pregunta que tenga. Document Released: 10/28/2008 Document Revised: 10/04/2017 Document Reviewed: 10/04/2017 Elsevier Patient Education  2020 [employer].

## 2019-03-27 ENCOUNTER — Other Ambulatory Visit: Payer: Self-pay | Admitting: Pharmacist

## 2019-03-27 LAB — CMP14+EGFR
ALT: 31 IU/L (ref 0–32)
AST: 17 IU/L (ref 0–40)
Albumin/Globulin Ratio: 1.4 (ref 1.2–2.2)
Albumin: 4.1 g/dL (ref 3.8–4.9)
Alkaline Phosphatase: 82 IU/L (ref 39–117)
BUN/Creatinine Ratio: 23 (ref 9–23)
BUN: 16 mg/dL (ref 6–24)
Bilirubin Total: 0.3 mg/dL (ref 0.0–1.2)
CO2: 25 mmol/L (ref 20–29)
Calcium: 9.7 mg/dL (ref 8.7–10.2)
Chloride: 105 mmol/L (ref 96–106)
Creatinine, Ser: 0.71 mg/dL (ref 0.57–1.00)
GFR calc Af Amer: 109 mL/min/{1.73_m2} (ref >59)
GFR calc non Af Amer: 95 mL/min/{1.73_m2} (ref >59)
Globulin, Total: 2.9 g/dL (ref 1.5–4.5)
Glucose: 106 mg/dL — ABNORMAL HIGH (ref 65–99)
Potassium: 4.8 mmol/L (ref 3.5–5.2)
Sodium: 141 mmol/L (ref 134–144)
Total Protein: 7 g/dL (ref 6.0–8.5)

## 2019-03-27 LAB — LIPID PANEL
Chol/HDL Ratio: 3.2 ratio (ref 0.0–4.4)
Cholesterol, Total: 177 mg/dL (ref 100–199)
HDL: 55 mg/dL (ref >39)
LDL Calculated: 96 mg/dL (ref 0–99)
Triglycerides: 132 mg/dL (ref 0–149)
VLDL Cholesterol Cal: 26 mg/dL (ref 5–40)

## 2019-03-27 LAB — CBC WITH DIFFERENTIAL/PLATELET
Basophils Absolute: 0.1 10*3/uL (ref 0.0–0.2)
Basos: 1 %
EOS (ABSOLUTE): 0.2 10*3/uL (ref 0.0–0.4)
Eos: 2 %
Hematocrit: 42.3 % (ref 34.0–46.6)
Hemoglobin: 13.4 g/dL (ref 11.1–15.9)
Immature Grans (Abs): 0 10*3/uL (ref 0.0–0.1)
Immature Granulocytes: 0 %
Lymphocytes Absolute: 2.8 10*3/uL (ref 0.7–3.1)
Lymphs: 36 %
MCH: 29 pg (ref 26.6–33.0)
MCHC: 31.7 g/dL (ref 31.5–35.7)
MCV: 92 fL (ref 79–97)
Monocytes Absolute: 0.5 10*3/uL (ref 0.1–0.9)
Monocytes: 6 %
Neutrophils Absolute: 4.2 10*3/uL (ref 1.4–7.0)
Neutrophils: 55 %
Platelets: 323 10*3/uL (ref 150–450)
RBC: 4.62 x10E6/uL (ref 3.77–5.28)
RDW: 14.3 % (ref 11.7–15.4)
WBC: 7.7 10*3/uL (ref 3.4–10.8)

## 2019-03-27 MED ORDER — PANTOPRAZOLE SODIUM 40 MG PO TBEC: 40.0000 mg | DELAYED_RELEASE_TABLET | Freq: Every day | ORAL | 2 refills | Status: DC

## 2019-03-27 MED FILL — ?PANTOPRAZOLE SO DR 40MG TA: 40 | 30 days supply | Qty: 30 | Fill #0

## 2019-03-29 ENCOUNTER — Telehealth (INDEPENDENT_AMBULATORY_CARE_PROVIDER_SITE_OTHER): Payer: Self-pay

## 2019-03-29 NOTE — Telephone Encounter (Signed)
Call placed using pacific interpreter [phone]) left voicemail notifying patient that labs are normal. Return call to [facility] at [phone] with any questions or concerns. Nat Christen, CMA

## 2019-03-29 NOTE — Telephone Encounter (Signed)
-----   Message from Kerin Perna, NP sent at 03/27/2019  9:30 AM EDT ----- I have reviewed all labs and they are normal/unremarkable. Please notify patient of the results. Have them schedule appointment if there are any other concerns or questions. Thank you.

## 2019-04-10 ENCOUNTER — Telehealth: Payer: Self-pay

## 2019-04-10 NOTE — Telephone Encounter (Signed)
Unclear where information came from - no orders placed for CPAP nor has she had a face to face to discuss only OSA. Will have Peter Congo to schedule patient.  Peter Congo please call and schedule patient for an in person OV to discuss sleep apnea.

## 2019-04-10 NOTE — Telephone Encounter (Signed)
The patient came to [facility] today and informed Frances Martin that she was told a CPAP machine would cost her $1000.   She has no insurance.  Do you know where the referral was sent?  We can send to [facility] and request that they assess her for their hardship program.  If she qualifies, there will be no cost for her.

## 2019-04-30 NOTE — Telephone Encounter (Signed)
Patient has an appointment on 9-23 @ 4:10pm to to discuss Sleep Apnea.  Thanks Whitney Post

## 2019-05-07 ENCOUNTER — Other Ambulatory Visit (INDEPENDENT_AMBULATORY_CARE_PROVIDER_SITE_OTHER): Payer: Self-pay | Admitting: Primary Care

## 2019-05-07 MED FILL — LISINOPRIL 20 MG TABLET: 20 | 30 days supply | Qty: 15 | Fill #1

## 2019-05-07 MED FILL — METFORMIN HCL ER 500 MG TB2: 500 | 30 days supply | Qty: 60 | Fill #1

## 2019-05-07 MED FILL — TRIAMCINOLONE ACETONIDE 0.1: 0.1 | 15 days supply | Qty: 30 | Fill #0

## 2019-05-07 MED FILL — PANTOPRAZOLE SOD DR 40 MG T: 40 | 30 days supply | Qty: 30 | Fill #1

## 2019-05-08 ENCOUNTER — Ambulatory Visit (INDEPENDENT_AMBULATORY_CARE_PROVIDER_SITE_OTHER): Payer: No Typology Code available for payment source | Admitting: Primary Care

## 2019-05-10 ENCOUNTER — Encounter (INDEPENDENT_AMBULATORY_CARE_PROVIDER_SITE_OTHER): Payer: Self-pay | Admitting: Primary Care

## 2019-05-10 ENCOUNTER — Other Ambulatory Visit: Payer: Self-pay

## 2019-05-10 ENCOUNTER — Ambulatory Visit (INDEPENDENT_AMBULATORY_CARE_PROVIDER_SITE_OTHER): Payer: Self-pay | Admitting: Primary Care

## 2019-05-10 DIAGNOSIS — R0602 Shortness of breath: Secondary | ICD-10-CM

## 2019-05-10 DIAGNOSIS — G4733 Obstructive sleep apnea (adult) (pediatric): Secondary | ICD-10-CM

## 2019-05-10 DIAGNOSIS — R0981 Nasal congestion: Secondary | ICD-10-CM

## 2019-05-10 NOTE — Progress Notes (Signed)
Virtual Visit via Telephone Note  I connected with Frances Martin on 05/10/19 at 10:10 AM EDT by telephone and verified that I am speaking with the correct person using two identifiers.   I discussed the limitations, risks, security and privacy concerns of performing an evaluation and management service by telephone and the availability of in person appointments. I also discussed with the patient that there may be a patient responsible charge related to this service. The patient expressed understanding and agreed to proceed.   History of Present Illness: Mrs Frances Martin presents today with complaints of snoring loudly, husband notes she stops breathing a few second during the night and she wakes up frequently not receiving a good night of sleep and is tired the next day.   Observations/Objective: Review of Systems  HENT: Positive for congestion.        Nasal  Respiratory: Positive for shortness of breath.     Assessment and Plan: Frances Martin was seen today for sleep apnea.  Diagnoses and all orders for this visit:  OSA (obstructive sleep apnea) Patient presents with possible obstructive sleep apnea. Patent has a  history of symptoms of OSA.[name] Patient generally gets 6-7  hours of sleep per night, and states they generally have poor quality wakes up 3-4 times a night  Snoring of severity is present. Apneic episodes 2-3  present. Nasal obstruction- nasal bone is not straight. Patient has her tonsillectomy.    Follow Up Instructions:    I discussed the assessment and treatment plan with the patient. The patient was provided an opportunity to ask questions and all were answered. The patient agreed with the plan and demonstrated an understanding of the instructions.   The patient was advised to call back or seek an in-person evaluation if the symptoms worsen or if the condition fails to improve as anticipated.  I provided 13 minutes of non-face-to-face time during this  encounter.   Kerin Perna, NP

## 2019-05-10 NOTE — Progress Notes (Signed)
Pt appointment today to discuss OSA. So that orders and office note can be faxed to [facility] to set patient up with CPAP and supplies

## 2019-05-28 ENCOUNTER — Ambulatory Visit (INDEPENDENT_AMBULATORY_CARE_PROVIDER_SITE_OTHER): Payer: No Typology Code available for payment source | Admitting: Primary Care

## 2019-06-04 MED FILL — METFORMIN HCL ER 500 MG TB2: 500 | 30 days supply | Qty: 60 | Fill #2

## 2019-06-04 MED FILL — LISINOPRIL 20 MG TABLET: 20 | 30 days supply | Qty: 15 | Fill #2

## 2019-06-04 MED FILL — PANTOPRAZOLE SOD DR 40 MG T: 40 | 30 days supply | Qty: 30 | Fill #2

## 2019-07-15 ENCOUNTER — Other Ambulatory Visit: Payer: Self-pay | Admitting: Family Medicine

## 2019-07-15 MED FILL — PRAVASTATIN SODIUM 20 MG TA: 20 | 30 days supply | Qty: 30 | Fill #0

## 2019-07-15 MED FILL — METFORMIN HCL ER 500 MG TB2: 500 | 30 days supply | Qty: 60 | Fill #3

## 2019-07-15 MED FILL — LISINOPRIL 20 MG TABLET: 20 | 30 days supply | Qty: 15 | Fill #3

## 2019-07-16 MED FILL — PANTOPRAZOLE SOD DR 40 MG T: 40 | 30 days supply | Qty: 30 | Fill #0

## 2019-07-30 ENCOUNTER — Other Ambulatory Visit: Payer: Self-pay

## 2019-07-30 ENCOUNTER — Encounter (INDEPENDENT_AMBULATORY_CARE_PROVIDER_SITE_OTHER): Payer: Self-pay | Admitting: Primary Care

## 2019-07-30 ENCOUNTER — Ambulatory Visit (INDEPENDENT_AMBULATORY_CARE_PROVIDER_SITE_OTHER): Payer: Self-pay | Admitting: Primary Care

## 2019-07-30 DIAGNOSIS — M2550 Pain in unspecified joint: Secondary | ICD-10-CM

## 2019-07-30 DIAGNOSIS — E119 Type 2 diabetes mellitus without complications: Secondary | ICD-10-CM

## 2019-07-30 DIAGNOSIS — K3184 Gastroparesis: Secondary | ICD-10-CM

## 2019-07-30 MED ORDER — CELECOXIB 200 MG PO CAPS: 200.0000 mg | ORAL_CAPSULE | Freq: Two times a day (BID) | ORAL | 6 refills | Status: DC

## 2019-07-30 MED FILL — ?CELECOXIB 200 MG CAPSULE: 200 | 30 days supply | Qty: 60 | Fill #0

## 2019-07-30 NOTE — Progress Notes (Signed)
Virtual Visit via Telephone Note  I connected with Frances Martin on 07/30/19 at  1:50 PM EST by telephone and verified that I am speaking with the correct person using two identifiers.   I discussed the limitations, risks, security and privacy concerns of performing an evaluation and management service by telephone and the availability of in person appointments. I also discussed with the patient that there may be a patient responsible charge related to this service. The patient expressed understanding and agreed to proceed.   History of Present Illness: Frances Martin is having a tele visit for nausea which feels like going to her throats -heart burn. Eats around 6 and goes to bed around 9 pm.  Past Medical History:  Diagnosis Date  . Hemorrhoids   . Hyperlipidemia   . Pre-diabetes    Current Outpatient Medications on File Prior to Visit  Medication Sig Dispense Refill  . aspirin EC 81 MG tablet Take 81 mg by mouth daily.    . celecoxib (CELEBREX) 200 MG capsule Take 1 capsule (200 mg total) by mouth 2 (two) times daily. 60 capsule 6  . lisinopril (ZESTRIL) 20 MG tablet Take 0.5 tablets (10 mg total) by mouth daily. 30 tablet 3  . metFORMIN (GLUCOPHAGE-XR) 500 MG 24 hr tablet Take 1 tablet (500 mg total) by mouth 2 (two) times daily after a meal. 60 tablet 3  . pantoprazole (PROTONIX) 40 MG tablet TAKE 1 TABLET (40 MG TOTAL) BY MOUTH DAILY. 30 tablet 2  . pravastatin (PRAVACHOL) 20 MG tablet Take 1 tablet (20 mg total) by mouth daily. 90 tablet 3  . triamcinolone cream (KENALOG) 0.1 % APPLY 1 APPLICATION TOPICALLY 2 (TWO) TIMES DAILY. 30 g 0   No current facility-administered medications on file prior to visit.   Observations/Objective: Review of Systems  Gastrointestinal: Positive for abdominal pain, heartburn and nausea.  All other systems reviewed and are negative.   Assessment and Plan: Frances Martin was seen today for gastroesophageal reflux and shoulder  pain.  Diagnoses and all orders for this visit:  Type 2 diabetes mellitus without complication, without long-term current use of insulin (HCC) Discussed that are high in carbohydrates are the following rice, potatoes, breads, sugars, and pastas.  Reduction in the intake (eating) will assist in lowering your blood sugars.  Gastroparesis Discussed  gastric emptying it consist of weak muscular contractions (peristalsis) of the stomach resulting in food and liquid remaining in the stomach for prolonged periods of time.Explained   stomach contents does exist more slowly into the duodenum digestive tract.  Symptoms can include nausea vomiting abdominal pain feeling full soon after or beginning to eat, abdominal bloating and heartburn.  Arthralgia, unspecified joint Work on losing weight to help reduce joint pain. May alternate with heat and ice application for pain relief. May also alternate with acetaminophen and Ibuprofen as prescribed pain relief. Other alternatives include massage, acupuncture and water aerobics.  You must stay active and avoid a sedentary lifestyle. Prescribed celebrex  Follow Up Instructions:    I discussed the assessment and treatment plan with the patient. The patient was provided an opportunity to ask questions and all were answered. The patient agreed with the plan and demonstrated an understanding of the instructions.   The patient was advised to call back or seek an in-person evaluation if the symptoms worsen or if the condition fails to improve as anticipated.  I provided 22 minutes of non-face-to-face time during this encounter.   Kerin Perna, NP

## 2019-08-14 MED FILL — metFORMIN HCL ER 500 MG TB2: 500 | 30 days supply | Qty: 60 | Fill #1

## 2019-08-14 MED FILL — PRAVASTATIN SODIUM 20 MG TA: 20 | 30 days supply | Qty: 30 | Fill #1

## 2019-08-14 MED FILL — PANTOPRAZOLE SOD DR 40 MG T: 40 | 30 days supply | Qty: 30 | Fill #1

## 2019-08-26 ENCOUNTER — Other Ambulatory Visit: Payer: Self-pay

## 2019-08-26 ENCOUNTER — Ambulatory Visit (INDEPENDENT_AMBULATORY_CARE_PROVIDER_SITE_OTHER): Payer: Self-pay | Admitting: Primary Care

## 2019-08-26 DIAGNOSIS — M2550 Pain in unspecified joint: Secondary | ICD-10-CM

## 2019-08-26 DIAGNOSIS — K3184 Gastroparesis: Secondary | ICD-10-CM

## 2019-08-26 DIAGNOSIS — I10 Essential (primary) hypertension: Secondary | ICD-10-CM

## 2019-08-26 DIAGNOSIS — E119 Type 2 diabetes mellitus without complications: Secondary | ICD-10-CM

## 2019-08-26 DIAGNOSIS — Z760 Encounter for issue of repeat prescription: Secondary | ICD-10-CM

## 2019-08-26 MED ORDER — PANTOPRAZOLE SODIUM 40 MG PO TBEC: 40.0000 mg | DELAYED_RELEASE_TABLET | Freq: Every day | ORAL | 2 refills | Status: DC

## 2019-08-26 MED ORDER — PRAVASTATIN SODIUM 20 MG PO TABS: 20.0000 mg | ORAL_TABLET | Freq: Every day | ORAL | 1 refills | Status: DC

## 2019-08-26 MED ORDER — LISINOPRIL 20 MG PO TABS: 10.0000 mg | ORAL_TABLET | Freq: Every day | ORAL | 1 refills | Status: DC

## 2019-08-26 MED ORDER — CELECOXIB 200 MG PO CAPS: 200.0000 mg | ORAL_CAPSULE | Freq: Two times a day (BID) | ORAL | 1 refills | Status: DC

## 2019-08-26 MED ORDER — METFORMIN HCL ER 500 MG PO TB24: 500.0000 mg | ORAL_TABLET | Freq: Two times a day (BID) | ORAL | 1 refills | Status: DC

## 2019-08-26 MED FILL — LISINOPRIL 20 MG TABLET: 20 | 30 days supply | Qty: 15 | Fill #0

## 2019-08-26 MED FILL — CELECOXIB 200 MG CAP: 200 | 30 days supply | Qty: 60 | Fill #0

## 2019-08-26 NOTE — Progress Notes (Signed)
Virtual Visit via Telephone Note  I connected with Frances Martin on 08/26/19 at  9:30 AM EST by telephone and verified that I am speaking with the correct person using two identifiers.   I discussed the limitations, risks, security and privacy concerns of performing an evaluation and management service by telephone and the availability of in person appointments. I also discussed with the patient that there may be a patient responsible charge related to this service. The patient expressed understanding and agreed to proceed.   History of Present Illness: Frances Martin is having a tele visit for the mainagement of her diabetes. She voices no complaints or concerns except she is unable to check her blood sugar-explain A1C was 6.7 and on oral agents was no need to check her blood sugar. Patient verbalized understanding   Past Medical History:  Diagnosis Date  . Hemorrhoids   . Hyperlipidemia   . Pre-diabetes    Current Outpatient Medications on File Prior to Visit  Medication Sig Dispense Refill  . aspirin EC 81 MG tablet Take 81 mg by mouth daily.    [name] triamcinolone cream (KENALOG) 0.1 % APPLY 1 APPLICATION TOPICALLY 2 (TWO) TIMES DAILY. 30 g 0   No current facility-administered medications on file prior to visit.   Observations/Objective: Review of Systems  All other systems reviewed and are negative.  Assessment and Plan: Frances Martin was seen today for follow-up.  Diagnoses and all orders for this visit:  Essential hypertension Bp goal </= 130/80, low sodium diet and exercise daily . Continue lisinopril for Bp and renal protection -     lisinopril (ZESTRIL) 20 MG tablet; Take 0.5 tablets (10 mg total) by mouth daily.  Arthralgia, unspecified joint -     celecoxib (CELEBREX) 200 MG capsule; Take 1 capsule (200 mg total) by mouth 2 (two) times daily.  Type 2 diabetes mellitus without complication, without long-term current use of insulin (HCC) Last A1C was 6.7 on  metformin control not at goal of 6.5 < continue to reduce carbohydrates and exercise  -     metFORMIN (GLUCOPHAGE-XR) 500 MG 24 hr tablet; Take 1 tablet (500 mg total) by mouth 2 (two) times daily after a meal.  Gastroparesis Discussed eating small frequent meal, reduction in acidic foods, fried foods ,spicy foods, alcohol caffeine and tobacco and certain medications. Avoid laying down after eating 39mins-1hour, elevated head of the bed.  Continue PPI (pantoprazole  Other orders/Medication refill -     pravastatin (PRAVACHOL) 20 MG tablet; Take 1 tablet (20 mg total) by mouth daily. -     pantoprazole (PROTONIX) 40 MG tablet; Take 1 tablet (40 mg total) by mouth daily.     Follow Up Instructions:    I discussed the assessment and treatment plan with the patient. The patient was provided an opportunity to ask questions and all were answered. The patient agreed with the plan and demonstrated an understanding of the instructions.   The patient was advised to call back or seek an in-person evaluation if the symptoms worsen or if the condition fails to improve as anticipated.  I provided 22 minutes of non-face-to-face time during this encounter.   Grayce Sessions, NP

## 2019-08-26 NOTE — Progress Notes (Signed)
Pt does not have glucometer.

## 2019-09-18 MED FILL — metFORMIN HCL ER 500 MG TB2: 500 | 30 days supply | Qty: 60 | Fill #0

## 2019-09-18 MED FILL — ?PRAVASTATIN NA 20MG TABL: 20 | 30 days supply | Qty: 30 | Fill #0

## 2019-10-09 ENCOUNTER — Other Ambulatory Visit: Payer: Self-pay

## 2019-10-09 ENCOUNTER — Ambulatory Visit: Payer: Self-pay | Attending: Primary Care

## 2019-10-09 DIAGNOSIS — Z1231 Encounter for screening mammogram for malignant neoplasm of breast: Secondary | ICD-10-CM

## 2019-10-10 ENCOUNTER — Telehealth: Payer: Self-pay

## 2019-10-10 NOTE — Telephone Encounter (Signed)
Opened in error

## 2019-10-10 NOTE — Telephone Encounter (Signed)
Message received from [facility] Counselor stating that the patient was inquiring about a CPAP machine.    Call placed to [facility] # [phone] to inquire if an order was received for the hardship program. Spoke to Kerr who said that the patient never followed through with the order.  The patient had explained to them that she has an ' orange card."    Will ask Carlos/Financial Counselor, who is Spanish speaking, to  contact patient and explain that the [employer] does not cover the CPAP and we will need to contact her about possible options for CPAP if they become available.

## 2019-10-11 NOTE — Telephone Encounter (Signed)
Spoke with Pt and was inform of the situation of the new company about the C-PAP, she is hopping we can help but she understand the issue

## 2019-10-24 MED FILL — metFORMIN HCL ER 500 MG TB2: 500 | 30 days supply | Qty: 60 | Fill #1

## 2019-10-24 MED FILL — ?PRAVASTATIN NA 20MG TABL: 20 | 30 days supply | Qty: 30 | Fill #1

## 2019-10-24 MED FILL — LISINOPRIL 20 MG TABLET: 20 | 30 days supply | Qty: 15 | Fill #1

## 2019-10-24 MED FILL — ?PANTOPRAZOLE SO DR 40MG TA: 40 | 30 days supply | Qty: 30 | Fill #0

## 2019-10-29 ENCOUNTER — Ambulatory Visit: Payer: Self-pay | Admitting: Advanced Practice Midwife

## 2019-10-29 ENCOUNTER — Ambulatory Visit
Admission: RE | Admit: 2019-10-29 | Discharge: 2019-10-29 | Disposition: A | Discharge status: Home / Self Care | Payer: No Typology Code available for payment source | Source: Ambulatory Visit | Attending: Obstetrics and Gynecology | Admitting: Obstetrics and Gynecology

## 2019-10-29 ENCOUNTER — Other Ambulatory Visit: Payer: Self-pay

## 2019-10-29 VITALS — BP 155/88 | Temp 97.3°F | Wt 192.0 lb

## 2019-10-29 DIAGNOSIS — Z1239 Encounter for other screening for malignant neoplasm of breast: Secondary | ICD-10-CM

## 2019-10-29 DIAGNOSIS — Z1231 Encounter for screening mammogram for malignant neoplasm of breast: Secondary | ICD-10-CM

## 2019-10-29 NOTE — Progress Notes (Signed)
Ms. Justene Jensen is a 59 y.o. female who presents to [facility] clinic today with no complaints .    Pap Smear: Pap not smear completed today. Last Pap smear was 04/13/2017 at [facility] clinic and was normal. Per patient has no history of an abnormal Pap smear. Last Pap smear result is available in Epic.   Physical exam: Breasts Breasts symmetrical. No skin abnormalities bilateral breasts. No nipple retraction bilateral breasts. No nipple discharge bilateral breasts. No lymphadenopathy. No lumps palpated bilateral breasts.       Pelvic/Bimanual Pap is not indicated today    Smoking History: Patient has is a current smoker at 3 cigarettes per day  packs per day. Patient referred to quit line.    Patient Navigation: Patient education provided. Access to services provided for patient through [facility] program. Spanish interpreter provided. No transportation provided   Colorectal Cancer Screening: Per patient has had colonoscopy completed on 04/14/2017 No complaints today.    Breast and Cervical Cancer Risk Assessment: Patient does not have family history of breast cancer, known genetic mutations, or radiation treatment to the chest before age 29. Patient does not have history of cervical dysplasia, immunocompromised, or DES exposure in-utero.  Risk Assessment    No risk assessment data for the current encounter   Risk Scores      10/16/2018   Last edited by: Lynnell Dike, LPN   5-year risk: 0.8 %   Lifetime risk: 5.7 %          A: BCCCP exam without pap smear Complaint of none  P: Referred patient to the [facility] for a screening mammogram. Appointment scheduled 10/29/2019 at 9:30am.  Thressa Sheller DNP, CNM  10/29/19  9:01 AM

## 2019-10-30 ENCOUNTER — Telehealth: Payer: Self-pay

## 2019-10-30 NOTE — Telephone Encounter (Signed)
As per Mary[facility], the order for the CPAP was voided because the patient never followed through with the hardship application. She thought that her Orange Card and [employer] covered the CPAP. Genella Rife explained to her that this is not true. A new order can be sent to [facility] [employer] and a hardship application requested but there is no guarantee that she will be approved for hardship assistance. And if approved, she may only be approved for a portion of the cost of the machine.

## 2019-10-30 NOTE — Telephone Encounter (Signed)
Called pt  [phone] DOB and name verified/ in regards to the CPAP machine requested. As instructed by Erskine Squibb, RNCM explained pt that the CPAP is not covered by the [facility] card or CFA. Instructed pt a new Hardship application will be needed  to obtain the machine at a cost. .Verbalized understanding. Stated she will wait for the call from [facility] and decide based on the cost .

## 2019-11-01 NOTE — Telephone Encounter (Signed)
Spoke with Mikle Bosworth / Stated he already reached out to the patient

## 2019-11-05 ENCOUNTER — Telehealth (INDEPENDENT_AMBULATORY_CARE_PROVIDER_SITE_OTHER): Payer: Self-pay

## 2019-11-05 NOTE — Telephone Encounter (Signed)
Patient has already self treatment she needs an appointment as evident by breast center telling her it is elevated

## 2019-11-05 NOTE — Telephone Encounter (Signed)
Sent to PCP ?

## 2019-11-05 NOTE — Telephone Encounter (Signed)
Patient called to inform PCP that her Blood Pressure has been high. Patient states that last week on March 16 she had an appointment with the Breast Center and her blood pressure was 158/88. Patient was advice to contact PCP. Patient states that when she takes lisinopril (ZESTRIL) 20 MG tablet   And was advice to cut the pill in half and take 10 mg. Patient states while taking the 10 mg pill she is feeling nauseas and dizzy. patient states she has cut the pill in half and is currently taking 5 mg but seems to not be working and is still feeling the same after taking the pill. Patient states she takes her medication at 10 pm and wakes up around 6 am with a headache and is dizzy through out the day. Patient states she is not taking the medication since Wednesday 17.  Patient has an appointment on April 12.   Please advice [phone]

## 2019-11-06 NOTE — Telephone Encounter (Signed)
Patient made an appointment on March 29.

## 2019-11-11 ENCOUNTER — Other Ambulatory Visit (INDEPENDENT_AMBULATORY_CARE_PROVIDER_SITE_OTHER): Payer: Self-pay | Admitting: Primary Care

## 2019-11-11 ENCOUNTER — Ambulatory Visit (INDEPENDENT_AMBULATORY_CARE_PROVIDER_SITE_OTHER): Payer: Self-pay | Admitting: Primary Care

## 2019-11-11 ENCOUNTER — Encounter (INDEPENDENT_AMBULATORY_CARE_PROVIDER_SITE_OTHER): Payer: Self-pay | Admitting: Primary Care

## 2019-11-11 ENCOUNTER — Other Ambulatory Visit: Payer: Self-pay

## 2019-11-11 VITALS — BP 143/85 | HR 81 | Temp 97.2°F | Ht 62.0 in | Wt 190.6 lb

## 2019-11-11 DIAGNOSIS — I10 Essential (primary) hypertension: Secondary | ICD-10-CM

## 2019-11-11 DIAGNOSIS — E119 Type 2 diabetes mellitus without complications: Secondary | ICD-10-CM

## 2019-11-11 DIAGNOSIS — G8929 Other chronic pain: Secondary | ICD-10-CM

## 2019-11-11 DIAGNOSIS — M25512 Pain in left shoulder: Secondary | ICD-10-CM

## 2019-11-11 DIAGNOSIS — M2550 Pain in unspecified joint: Secondary | ICD-10-CM

## 2019-11-11 LAB — POCT GLYCOSYLATED HEMOGLOBIN (HGB A1C): Hemoglobin A1C: 6.4 % — AB (ref 4.0–5.6)

## 2019-11-11 LAB — GLUCOSE, POCT (MANUAL RESULT ENTRY): POC Glucose: 187 mg/dl — AB (ref 70–99)

## 2019-11-11 MED ORDER — LOSARTAN POTASSIUM 50 MG PO TABS: 50.0000 mg | ORAL_TABLET | Freq: Every day | ORAL | 3 refills | Status: DC

## 2019-11-11 MED ORDER — CELECOXIB 200 MG PO CAPS: 200.0000 mg | ORAL_CAPSULE | Freq: Two times a day (BID) | ORAL | 1 refills | Status: DC

## 2019-11-11 MED FILL — LOSARTAN POTASSIUM 50 MG TA: 50 | 30 days supply | Qty: 30 | Fill #0

## 2019-11-11 MED FILL — CELECOXIB 200 MG CAP: 200 | 30 days supply | Qty: 60 | Fill #0

## 2019-11-11 NOTE — Patient Instructions (Signed)
Mareos Dizziness Los mareos son un problema muy frecuente. Causan sensacin de inestabilidad o de desvanecimiento. Puede sentir que se va a desmayar. Los mareos pueden provocarle una lesin si se tropieza o se cae. La causa puede deberse a muchos problemas, tales como los siguientes:  Medicamentos.  No tener suficiente agua en el cuerpo (deshidratacin).  Enfermedad. Siga estas indicaciones en su casa: Comida y bebida   Beba suficiente lquido para mantener el pis (orina) claro o de color amarillo plido. Esto evita la deshidratacin. Trate de beber ms lquidos transparentes, como agua.  No beba alcohol.  Limite la cantidad de cafena que bebe o come si el mdico se lo indica.  Limite la cantidad de sal (sodio) que bebe o come si el mdico se lo indica. Actividad   Evite los movimientos rpidos. ? Cuando se levante de una silla, sujtese hasta sentirse bien. ? Por la maana, sintese primero a un lado de la cama. Cuando se sienta bien, pngase lentamente de pie mientras se sostiene de algo. Haga esto hasta que se sienta seguro en cuanto al equilibrio.  Mueva las piernas con frecuencia si debe estar de pie en un lugar durante mucho tiempo. Mientras est de pie, contraiga y relaje los msculos de las piernas.  No conduzca vehculos ni opere maquinaria pesada si se siente mareado.  Evite agacharse si se siente mareado. En su casa, coloque los objetos en algn lugar que le resulte fcil alcanzarlos sin agacharse. Estilo de vida  No consuma ningn producto que contenga nicotina o tabaco, como cigarrillos y cigarrillos electrnicos. Si necesita ayuda para dejar de fumar, consulte al mdico.  Intente bajar el nivel de estrs. Para hacerlo, puede usar mtodos como el yoga o la meditacin. Hable con el mdico si necesita ayuda. Instrucciones generales  Controle sus mareos para ver si hay cambios.  Tome los medicamentos de venta libre y los recetados solamente como se lo haya indicado  el mdico. Hable con el mdico si cree que la causa de sus mareos es algn medicamento que est tomando.  Infrmele a un amigo o a un familiar si se siente mareado. Pdale a esta persona que llame al mdico si observa cambios en su comportamiento.  Concurra a todas las visitas de control como se lo haya indicado el mdico. Esto es importante. Comunquese con un mdico si:  Los mareos persisten.  Los mareos o la sensacin de desvanecimiento empeoran.  Siente malestar estomacal (nuseas).  Tiene problemas para escuchar.  Aparecen nuevos sntomas.  Siente inestabilidad al estar de pie.  Siente que la habitacin da vueltas. Solicite ayuda de inmediato si:  Vomita o tiene heces acuosas (diarrea), y no puede comer o beber nada.  Tiene dificultad para hacer lo siguiente: ? Hablar. ? Caminar. ? Tragar. ? Usar los brazos, las manos o las piernas.  Se siente constantemente dbil.  No piensa con claridad o tiene dificultad para armar oraciones. Es posible que un amigo o un familiar adviertan que esto ocurre.  Tiene los siguientes sntomas: ? Dolor en el pecho. ? Dolor en el vientre (abdomen). ? Falta de aire. ? Sudoracin.  Cambios en la visin.  Sangrado.  Dolor de cabeza muy intenso.  Dolor o rigidez en el cuello.  Fiebre. Estos sntomas pueden indicar una emergencia. No espere hasta que los sntomas desaparezcan. Solicite atencin mdica de inmediato. Comunquese con el servicio de emergencias de su localidad (911 en los [place]). No conduzca por sus propios medios hasta el hospital. Resumen  Los   mareos causan sensacin de inestabilidad o de desvanecimiento. Puede sentir que se va a desmayar.  Beba suficiente lquido para mantener el pis (orina) claro o de color amarillo plido. No beba alcohol.  Evite los movimientos rpidos si se siente mareado.  Controle sus mareos para ver si hay cambios. Esta informacin no tiene como fin reemplazar el consejo del  mdico. Asegrese de hacerle al mdico cualquier pregunta que tenga. Document Revised: 02/02/2017 Document Reviewed: 02/02/2017 Elsevier Patient Education  2020 Elsevier Inc.  

## 2019-11-11 NOTE — Progress Notes (Signed)
Frances Martin is a 59 y.o. female who presents for an followup evaluation of Type2 diabetes mellitus.  Current symptoms/problems include visual disturbances and have been worsening. Symptoms have been present for 4 month.  Current diabetic medications include oral agent (monotherapy): metformin XR 500mg  twice daily.  The patient was initially diagnosed with Type 2 diabetes mellitus based on the following criteria:  A1C.  Current monitoring regimen: none Home blood sugar records: none  Any episodes of hypoglycemia?yes  Known diabetic complications: none Cardiovascular risk factors: advanced age (older than 2 for men, 76 for women), diabetes mellitus and obesity (BMI >= 30 kg/m2) Eye exam current (within one year): no Weight trend: stable Prior visit with CDE: no Current diet: in general, a "healthy" diet   Current exercise: cardiovascular workout on exercise equipment, housecleaning and walking Medication Compliance?  Yes   Is She on ACE inhibitor or angiotensin II receptor blocker?  Yes stopped due to headaches  Start losartan 50mg   The following portions of the patient's history were reviewed and updated as appropriate: allergies, current medications, past family history, past medical history, past social history, past surgical history and problem list.  Review of Systems  Musculoskeletal:       Left should with crepitus notes   All other systems reviewed and are negative.   Objective:    BP (!) 143/85 (BP Location: Right Arm, Patient Position: Sitting, Cuff Size: Normal)   Pulse 81   Temp (!) 97.2 F (36.2 C) (Temporal)   Ht 5\' 2"  (1.575 m)   Wt 190 lb 9.6 oz (86.5 kg)   SpO2 96%   BMI 34.86 kg/m   Physical Exam Vitals reviewed.  Constitutional:      Appearance: She is obese.  HENT:     Head: Normocephalic.     Nose: Nose normal.  Cardiovascular:     Rate and Rhythm: Normal rate and regular rhythm.  Pulmonary:     Effort: Pulmonary effort is normal.      Breath sounds: Normal breath sounds.  Abdominal:     General: Bowel sounds are normal.  Musculoskeletal:        General: Normal range of motion.     Cervical back: Normal range of motion.  Skin:    General: Skin is warm and dry.  Neurological:     Mental Status: She is alert and oriented to person, place, and time.  Psychiatric:        Mood and Affect: Mood normal.        Behavior: Behavior normal.        Thought Content: Thought content normal.        Judgment: Judgment normal.      Lab Review Glucose (mg/dL)  Date Value  76 106 (H)  09/27/2018 114 (H)  07/03/2018 113 (H)   CO2 (mmol/L)  Date Value  03/26/2019 25  09/27/2018 25  07/03/2018 23   BUN (mg/dL)  Date Value  05/26/2019 16  09/27/2018 14  07/03/2018 13   Creatinine, Ser (mg/dL)  Date Value  87/56/4332 0.71  09/27/2018 0.80  07/03/2018 0.69      Assessment:    Diabetes Mellitus type II, under excellent control.   Frances Martin was seen today for hypertension and diabetes.  Diagnoses and all orders for this visit:  Type 2 diabetes mellitus without complication, without long-term current use of insulin (HCC) -     HgB A1c      Plan:    1.  Rx  changes: none 2.  Education: Reviewed 'ABCs' of diabetes management (respective goals in parentheses):  A1C (<7), blood pressure (<130/80), and cholesterol (LDL <100). 3. Discussed pathophysiology of DM; difference between type 1 and type 2 DM. 4. CHO counting diet discussed.  Reviewed CHO amount in various foods and how to read nutrition labels.  Discussed recommended serving sizes.  5.  Recommended increase physical activity - goal is 150 minutes per week 6. Follow up: 3   Frances Martin was seen today for hypertension and diabetes.  Essential hypertension Stopped Lisinopril gave her head aches changed to Losartan 50mg  daily Goal BP:  For patients younger than 60: Goal BP 130/80 For patients 60 and older: Goal BP < 150/90. For patients with  diabetes: Goal BP < 140/90. Your most recent BP: (143/85 mmHg.  Take your medications faithfully as instructed. Maintain a healthy weight. Get at least 150 minutes of aerobic exercise per week. Minimize salt intake. Minimize alcohol intake   Chronic left shoulder pain -     celecoxib (CELEBREX) 200 MG capsule; Take 1 capsule (200 mg total) by mouth 2 (two) times daily.  Arthralgia, unspecified joint -     celecoxib (CELEBREX) 200 MG capsule; Take 1 capsule (200 mg total) by mouth 2 (two) times daily.

## 2019-11-11 NOTE — Progress Notes (Signed)
Pt states she feels a little dizzy this morning

## 2019-11-13 ENCOUNTER — Other Ambulatory Visit (INDEPENDENT_AMBULATORY_CARE_PROVIDER_SITE_OTHER): Payer: Self-pay

## 2019-11-13 ENCOUNTER — Other Ambulatory Visit: Payer: Self-pay

## 2019-11-13 DIAGNOSIS — E119 Type 2 diabetes mellitus without complications: Secondary | ICD-10-CM

## 2019-11-13 DIAGNOSIS — I10 Essential (primary) hypertension: Secondary | ICD-10-CM

## 2019-11-14 LAB — CBC WITH DIFFERENTIAL/PLATELET
Basophils Absolute: 0.1 10*3/uL (ref 0.0–0.2)
Basos: 1 %
EOS (ABSOLUTE): 0.2 10*3/uL (ref 0.0–0.4)
Eos: 2 %
Hematocrit: 40.6 % (ref 34.0–46.6)
Hemoglobin: 13.3 g/dL (ref 11.1–15.9)
Immature Grans (Abs): 0 10*3/uL (ref 0.0–0.1)
Immature Granulocytes: 0 %
Lymphocytes Absolute: 3 10*3/uL (ref 0.7–3.1)
Lymphs: 36 %
MCH: 29.6 pg (ref 26.6–33.0)
MCHC: 32.8 g/dL (ref 31.5–35.7)
MCV: 90 fL (ref 79–97)
Monocytes Absolute: 0.5 10*3/uL (ref 0.1–0.9)
Monocytes: 5 %
Neutrophils Absolute: 4.5 10*3/uL (ref 1.4–7.0)
Neutrophils: 56 %
Platelets: 330 10*3/uL (ref 150–450)
RBC: 4.5 x10E6/uL (ref 3.77–5.28)
RDW: 13.4 % (ref 11.7–15.4)
WBC: 8.3 10*3/uL (ref 3.4–10.8)

## 2019-11-14 LAB — CMP14+EGFR
ALT: 19 IU/L (ref 0–32)
AST: 12 IU/L (ref 0–40)
Albumin/Globulin Ratio: 1.5 (ref 1.2–2.2)
Albumin: 4.1 g/dL (ref 3.8–4.9)
Alkaline Phosphatase: 80 IU/L (ref 39–117)
BUN/Creatinine Ratio: 16 (ref 9–23)
BUN: 12 mg/dL (ref 6–24)
Bilirubin Total: 0.2 mg/dL (ref 0.0–1.2)
CO2: 22 mmol/L (ref 20–29)
Calcium: 9.4 mg/dL (ref 8.7–10.2)
Chloride: 108 mmol/L — ABNORMAL HIGH (ref 96–106)
Creatinine, Ser: 0.77 mg/dL (ref 0.57–1.00)
GFR calc Af Amer: 99 mL/min/{1.73_m2} (ref >59)
GFR calc non Af Amer: 86 mL/min/{1.73_m2} (ref >59)
Globulin, Total: 2.7 g/dL (ref 1.5–4.5)
Glucose: 106 mg/dL — ABNORMAL HIGH (ref 65–99)
Potassium: 4.5 mmol/L (ref 3.5–5.2)
Sodium: 143 mmol/L (ref 134–144)
Total Protein: 6.8 g/dL (ref 6.0–8.5)

## 2019-11-14 LAB — LIPID PANEL
Chol/HDL Ratio: 3.1 ratio (ref 0.0–4.4)
Cholesterol, Total: 169 mg/dL (ref 100–199)
HDL: 55 mg/dL (ref >39)
LDL Chol Calc (NIH): 91 mg/dL (ref 0–99)
Triglycerides: 128 mg/dL (ref 0–149)
VLDL Cholesterol Cal: 23 mg/dL (ref 5–40)

## 2019-11-25 ENCOUNTER — Ambulatory Visit (INDEPENDENT_AMBULATORY_CARE_PROVIDER_SITE_OTHER): Payer: Self-pay | Admitting: Primary Care

## 2019-11-26 MED FILL — metFORMIN HCL ER 500 MG TB2: 500 | 30 days supply | Qty: 60 | Fill #2

## 2019-11-26 MED FILL — ?PRAVASTATIN NA 20MG TABL: 20 | 30 days supply | Qty: 30 | Fill #2

## 2019-11-26 MED FILL — ?PANTOPRAZOLE SO DR 40MG TA: 40 | 30 days supply | Qty: 30 | Fill #1

## 2020-01-01 MED FILL — PRAVASTATIN SODIUM 20 MG TA: 20 | 30 days supply | Qty: 30 | Fill #3

## 2020-01-01 MED FILL — CELECOXIB 200 MG CAPSULE: 200 | 30 days supply | Qty: 60 | Fill #1

## 2020-01-01 MED FILL — LOSARTAN POTASSIUM 50 MG TA: 50 | 30 days supply | Qty: 30 | Fill #1

## 2020-01-01 MED FILL — TRIAMCINOLONE ACETONIDE 0.1: 0.1 | 15 days supply | Qty: 30 | Fill #0

## 2020-01-01 MED FILL — metFORMIN HCL ER 500 MG TB2: 500 | 30 days supply | Qty: 60 | Fill #3

## 2020-01-01 MED FILL — ?PANTOPRAZOLE SO DR 40MG TA: 40 | 30 days supply | Qty: 30 | Fill #2

## 2020-01-12 ENCOUNTER — Other Ambulatory Visit (INDEPENDENT_AMBULATORY_CARE_PROVIDER_SITE_OTHER): Payer: Self-pay | Admitting: Primary Care

## 2020-01-12 DIAGNOSIS — G4733 Obstructive sleep apnea (adult) (pediatric): Secondary | ICD-10-CM

## 2020-02-11 ENCOUNTER — Ambulatory Visit (INDEPENDENT_AMBULATORY_CARE_PROVIDER_SITE_OTHER): Payer: Self-pay | Admitting: Primary Care

## 2020-02-11 ENCOUNTER — Other Ambulatory Visit: Payer: Self-pay

## 2020-02-11 ENCOUNTER — Encounter (INDEPENDENT_AMBULATORY_CARE_PROVIDER_SITE_OTHER): Payer: Self-pay | Admitting: Primary Care

## 2020-02-11 ENCOUNTER — Ambulatory Visit (INDEPENDENT_AMBULATORY_CARE_PROVIDER_SITE_OTHER): Payer: Self-pay | Admitting: Licensed Clinical Social Worker

## 2020-02-11 ENCOUNTER — Other Ambulatory Visit: Payer: Self-pay | Admitting: Primary Care

## 2020-02-11 VITALS — BP 127/84 | HR 76 | Resp 16 | Wt 187.0 lb

## 2020-02-11 DIAGNOSIS — G8929 Other chronic pain: Secondary | ICD-10-CM

## 2020-02-11 DIAGNOSIS — M2550 Pain in unspecified joint: Secondary | ICD-10-CM

## 2020-02-11 DIAGNOSIS — I10 Essential (primary) hypertension: Secondary | ICD-10-CM

## 2020-02-11 DIAGNOSIS — F5101 Primary insomnia: Secondary | ICD-10-CM

## 2020-02-11 DIAGNOSIS — F320 Major depressive disorder, single episode, mild: Secondary | ICD-10-CM

## 2020-02-11 DIAGNOSIS — M25512 Pain in left shoulder: Secondary | ICD-10-CM

## 2020-02-11 DIAGNOSIS — Z760 Encounter for issue of repeat prescription: Secondary | ICD-10-CM

## 2020-02-11 DIAGNOSIS — E119 Type 2 diabetes mellitus without complications: Secondary | ICD-10-CM

## 2020-02-11 LAB — POCT GLYCOSYLATED HEMOGLOBIN (HGB A1C): Hemoglobin A1C: 6.4 % — AB (ref 4.0–5.6)

## 2020-02-11 MED ORDER — METFORMIN HCL ER 500 MG PO TB24: 500.0000 mg | ORAL_TABLET | Freq: Two times a day (BID) | ORAL | 1 refills | Status: DC

## 2020-02-11 MED ORDER — FLUOXETINE HCL 10 MG PO TABS
20.0000 mg | ORAL_TABLET | Freq: Every day | ORAL | 0 refills | Status: DC
Start: 2020-02-11 — End: 2020-10-13

## 2020-02-11 MED ORDER — PANTOPRAZOLE SODIUM 40 MG PO TBEC: 40.0000 mg | DELAYED_RELEASE_TABLET | Freq: Every day | ORAL | 2 refills | Status: DC

## 2020-02-11 MED ORDER — PRAVASTATIN SODIUM 20 MG PO TABS: 20.0000 mg | ORAL_TABLET | Freq: Every day | ORAL | 1 refills | Status: DC

## 2020-02-11 MED ORDER — CELECOXIB 200 MG PO CAPS: 200.0000 mg | ORAL_CAPSULE | Freq: Two times a day (BID) | ORAL | 1 refills | Status: DC

## 2020-02-11 MED ORDER — TRIAMCINOLONE ACETONIDE 0.1 % EX CREA: 1.0000 "application " | TOPICAL_CREAM | Freq: Two times a day (BID) | CUTANEOUS | 0 refills | Status: DC

## 2020-02-11 MED FILL — FLUoxetine HCL 10 MG CAPS: 10 | 30 days supply | Qty: 60 | Fill #0

## 2020-02-11 MED FILL — ?PRAVASTATIN NA 20MG TABL: 20 | 30 days supply | Qty: 30 | Fill #0

## 2020-02-11 MED FILL — ?CELECOXIB 200 MG CAPSULE: 200 | 30 days supply | Qty: 60 | Fill #0

## 2020-02-11 MED FILL — metFORMIN HCL ER 500 MG TB2: 500 | 30 days supply | Qty: 60 | Fill #0

## 2020-02-11 MED FILL — ?PANTOPRAZOLE SO DR 40MG TA: 40 | 30 days supply | Qty: 30 | Fill #0

## 2020-02-11 MED FILL — TRIAMCINOLONE ACETONIDE 0.1: 0.1 | 15 days supply | Qty: 30 | Fill #0

## 2020-02-11 NOTE — Patient Instructions (Signed)
Fluoxetine capsules or tablets (Depression/Mood Disorders) O que  este medicamento? A FLUOXETINA pertence a uma classe de medicamentos chamados inibidores seletivos de Equities trader serotonina (ISRS). Ajuda a tratar os problemas de humor, como a depresso, o transtorno obsessivo-compulsivo e os ataques de pnico. Tambm pode ser usada para tratar alguns transtornos alimentares. Este medicamento pode ser usado para outros propsitos; em caso de dvidas, pergunte ao seu profissional de sade ou farmacutico. NOMES DE MARCAS COMUNS: Prozac O que devo dizer a meu profissional de sade antes de tomar este medicamento? Precisam saber se voc tem algum dos seguintes problemas ou estados de sade:  sofre de transtorno bipolar ou h histrico familiar de transtorno bipolar  problemas de sangramento  glaucoma  doenas cardacas  doenas hepticas  nveis baixos de sdio no sangue  convulses (crises convulsivas)  pensamentos suicidas ou planos de cometer suicdio  tentativa de suicdio (sua ou de um parente)  est tomando IMAOs, tais como Carbex, Eldepryl, Marplan, Nardil e Parnate  est tomando medicamentos para tratar ou prevenir cogulos (trombose)  Engineer, maintenance tiroide  reao estranha ou alergia  fluoxetina  reao estranha ou alergia a outros medicamentos  reao estranha ou alergia a alimentos, corantes ou conservantes  est grvida ou tentando Dance movement psychotherapist  est [employer] devo usar este medicamento? Tome este medicamento por via oral com um copo d'gua. Siga as instrues na embalagem ou na bula. Voc pode tomar este medicamento com ou sem comida. Tome este medicamento em intervalos regulares. No tome este medicamento com frequncia maior do que a indicada. No pare de tomar [employer] subitamente, exceto sob aconselhamento mdico. Interromper o uso desta medicao muito rapidamente pode causar efeitos secundrios graves ou agravar a sua condio. O farmacutico  lhe dar um folheto informativo especial a cada compra do medicamento. No se esquea de ler atentamente essas informaes todas as vezes. Fale com seu pediatra a respeito do uso deste medicamento em crianas. Embora este medicamento possa ser receitado para crianas a [employer] 7 anos de idade para certas condies, algumas precaues so necessrias. Superdosagem: Se achar que tomou uma superdosagem deste medicamento, entre em contato imediatamente com o [facility] ou v a [facility]. OBSERVAO: Este medicamento  s para voc. No compartilhe este medicamento com outras pessoas. E se eu deixar de tomar uma dose? Se perder uma dose, pule a dose perdida e tome a prxima dose marcada. No tome o remdio em dobro, nem tome uma dose adicional. O que pode interagir com este medicamento? No tome este medicamento com nenhum dos seguintes:  outros medicamentos que contenham fluoxetina, como [place] ou [place]  cisaprida  dronedarona  linezolida  medicamentos chamados inibidores da MAO, como Nardil, Parnate, Marplan, Eldepryl, Carbex  azul de metileno (injetado na veia)  pimozida  tioridazina Este medicamento tambm pode interagir com os seguintes remdios:  lcool  anfetamina  aspirina e medicamentos similares  carbamazepina  alguns medicamentos para depresso, ansiedade ou transtornos psicticos  alguns medicamentos para enxaqueca, como almotriptana, eletriptana, frovatriptana, naratriptana, rizatriptana, sumatriptana, zolmitriptana  digoxina  diurticos  fentanila  flecainida  furazolidona  isoniazida  ltio  medicamentos para dormir  alguns medicamentos que tratam ou previnem a trombose, como varfarina, enoxaparina e dalteparina  AINEs, medicamentos analgsicos e anti-inflamatrios, como ibuprofeno, naproxeno  outros medicamentos que prolongam o intervalo QT (um ritmo cardaco  anormal)  fenitona  procarbazina  propafenona  rasagilina  ritonavir  suplementos como erva-de-so-joo, kava kava ou valeriana  tramadol  triptofano  vimblastina  Esta lista pode no descrever todas as interaes possveis. D ao seu profissional de sade uma lista de todos os medicamentos, ervas medicinais, remdios de venda livre, ou suplementos alimentares que voc [place]. Diga tambm se voc fuma, bebe, ou [place] drogas ilcitas. Alguns destes podem interagir com o seu medicamento. Ao que devo ficar atento quando estiver [place] medicamento? Avise seu mdico ou profissional de sade se os seus sintomas no melhorarem ou se piorarem. Consulte seu mdico ou profissional de sade para realizar um acompanhamento regular Education officer, museum. Uma vez que pode levar vrias semanas para sentir os efeitos deste medicamento,  importante continuar o tratamento conforme prescrito pelo seu mdico. Os pacientes e seus familiares devem ficar atentos a uma possvel recorrncia ou piora da depresso ou pensamentos suicidas. Tambm devem ficar atentos a qualquer [employer] ou grave nas sensaes do paciente, como ansiedade, [place], [place], [place], agressividade, impulsividade, forte inquietao, excitao excessiva, hiperatividade ou insnia. Se isso acontecer, principalmente no comeo do tratamento ou aps uma mudana de dose, entre em contato com seu mdico ou profissional de sade. Voc pode sentir sonolncia ou tontura. No dirija, no opere mquinas e no faa nada que exija concentrao mental at [employer] como o medicamento lhe afeta. No se sente nem se levante rpido demais, principalmente se for um paciente idoso. Isso diminui o risco de [place] ou desmaio. O lcool pode interferir com os efeitos deste medicamento. Evite bebidas alcolicas. Voc pode ficar com a boca seca. Mascar chiclete sem acar ou chupar balas, alm de beber bastante gua, pode ajudar. Entre em contato com  seu mdico se o problema Science writer. Este medicamento pode afetar a glicemia. Se tem diabetes, entre em contato com seu mdico ou profissional de sade antes de mudar sua dieta ou a dose do seu medicamento para diabetes. Que efeitos colaterais posso sentir aps usar este medicamento? Efeitos colaterais que devem ser informados ao seu mdico ou profissional de sade o mais rpido possvel:  reaes alrgicas, como erupo na pele, coceira, urticria, ou inchao do rosto, [employer] ou da lngua  sensao de ansiedade  fezes negras, com aspecto de piche  dificuldade para respirar  alteraes na viso  confuso  humor elevado, diminuio da necessidade de sono, pensamentos descontrolados, comportamento impulsivo  dor nos olhos  batimento cardaco acelerado ou irregular  sensao de tontura, desmaio, quedas  sensao de agitao, raiva ou irritabilidade  alucinao, perda do contato com a realidade  perda de equilbrio ou coordenao  perda de memria  erees dolorosas ou prolongadas  inquietao, agitao, incapacidade de manter-se quieto  convulses (crises convulsivas)  rigidez muscular  pensamentos suicidas ou outras alteraes do humor  dificuldade para dormir  sangramentos ou hematomas fora do comum  fraqueza ou cansao fora do comum  vmitos Efeitos colaterais que normalmente no precisam de cuidados mdicos (avise ao seu mdico ou profissional de sade se persistirem ou forem incmodos):  alteraes no apetite ou no peso  [place] na libido ou no desempenho sexual  diarreia  boca seca  dor de cabea  aumento do suor  enjoo  tremores Esta lista pode no descrever todos os efeitos colaterais possveis. Para mais orientaes sobre efeitos colaterais, consulte o seu mdico. Voc pode relatar a ocorrncia de efeitos colaterais  FDA pelo telefone [phone]. Onde devo guardar meu medicamento? [place] fora do [employer]. Conservar em temperatura ambiente, entre 15 e 30 degreesC [phone] e 86 degreesF). Descartar qualquer medicamento no utilizado aps a data de  validade impressa no rtulo ou embalagem. OBSERVAO: Este folheto  um resumo. Pode no cobrir todas as informaes possveis. Se tiver dvidas a respeito deste medicamento, fale com seu mdico, farmacutico ou profissional de sade.  2020 Elsevier/Gold Standard (2018-06-11 00:00:00)

## 2020-02-11 NOTE — Progress Notes (Signed)
6 mos f/u  Medication RF  Stress and tension built up in neck and arms

## 2020-02-11 NOTE — Progress Notes (Signed)
Established Patient Office Visit  Subjective:  [name], female    DOB: January 14, 1961  Age: 59 y.o. MRN: [id number]  CC:  Chief Complaint  Patient presents with  . Diabetes  . Hypertension    HPI Frances Martin is 59 year old female  presents for management of hypertension and diabetes . She denies shortness of breath, headaches, chest pain or lower extremity edema. Denies increase urination, thirst or hunger. Only concern is tension in her neck and not able to sleep.  Past Medical History:  Diagnosis Date  . Hemorrhoids   . Hyperlipidemia   . Hypertension   . Pre-diabetes     Past Surgical History:  Procedure Laterality Date  . CESAREAN SECTION     3 previous  . COLONOSCOPY WITH PROPOFOL N/A 11/14/2017   Procedure: COLONOSCOPY WITH PROPOFOL;  Surgeon: [name], MD;  Location: [facility] ENDOSCOPY;  Service: Gastroenterology;  Laterality: N/A;  . COLONOSCOPY WITH PROPOFOL N/A 01/22/2018   Procedure: COLONOSCOPY WITH PROPOFOL;  Surgeon: [name], MD;  Location: [facility] ENDOSCOPY;  Service: Gastroenterology;  Laterality: N/A;  . TUBAL LIGATION      Family History  Problem Relation Age of Onset  . Diabetes Mother   . Hypertension Mother   . Diabetes Father     Social History   Socioeconomic History  . Marital status: Married    Spouse name: Not on file  . Number of children: 3  . Years of education: Not on file  . Highest education level: 10th grade  Occupational History  . Not on file  Tobacco Use  . Smoking status: Current Every Day Smoker    Types: Cigarettes  . Smokeless tobacco: Never Used  . Tobacco comment: 3 a day  Vaping Use  . Vaping Use: Never used  Substance and Sexual Activity  . Alcohol use: No  . Drug use: No  . Sexual activity: Yes    Birth control/protection: Surgical  Other Topics Concern  . Not on file  Social History Narrative  . Not on file   Social Determinants of Health   Financial Resource Strain:    . Difficulty of Paying Living Expenses:   Food Insecurity:   . Worried About Programme researcher, broadcasting/film/video in the Last Year:   . Barista in the Last Year:   Transportation Needs: No Transportation Needs  . Lack of Transportation (Medical): No  . Lack of Transportation (Non-Medical): No  Physical Activity:   . Days of Exercise per Week:   . Minutes of Exercise per Session:   Stress:   . Feeling of Stress :   Social Connections:   . Frequency of Communication with Friends and Family:   . Frequency of Social Gatherings with Friends and Family:   . Attends Religious Services:   . Active Member of Clubs or Organizations:   . Attends Banker Meetings:   [name] Marital Status:   Intimate Partner Violence:   . Fear of Current or Ex-Partner:   . Emotionally Abused:   [name] Physically Abused:   . Sexually Abused:     Outpatient Medications Prior to Visit  Medication Sig Dispense Refill  . aspirin EC 81 MG tablet Take 81 mg by mouth daily.    [name] losartan (COZAAR) 50 MG tablet Take 1 tablet (50 mg total) by mouth daily. 90 tablet 3  . celecoxib (CELEBREX) 200 MG capsule Take 1 capsule (200 mg total) by mouth 2 (two) times daily.  180 capsule 1  . metFORMIN (GLUCOPHAGE-XR) 500 MG 24 hr tablet Take 1 tablet (500 mg total) by mouth 2 (two) times daily after a meal. 180 tablet 1  . pantoprazole (PROTONIX) 40 MG tablet Take 1 tablet (40 mg total) by mouth daily. 30 tablet 2  . pravastatin (PRAVACHOL) 20 MG tablet Take 1 tablet (20 mg total) by mouth daily. 90 tablet 1  . triamcinolone cream (KENALOG) 0.1 % APPLY 1 APPLICATION TOPICALLY 2 (TWO) TIMES DAILY. 30 g 0   No facility-administered medications prior to visit.    No Known Allergies  ROS Review of Systems  Musculoskeletal: Positive for neck pain and neck stiffness.       Stress  Psychiatric/Behavioral: Positive for sleep disturbance.      Objective:    Physical Exam Vitals reviewed.  Constitutional:      Appearance: She  is obese.  HENT:     Head: Normocephalic.  Eyes:     Extraocular Movements: Extraocular movements intact.  Cardiovascular:     Rate and Rhythm: Normal rate and regular rhythm.     Pulses: Normal pulses.  Pulmonary:     Effort: Pulmonary effort is normal.     Breath sounds: Normal breath sounds.  Abdominal:     General: Bowel sounds are normal.  Musculoskeletal:        General: Normal range of motion.     Cervical back: Normal range of motion and neck supple.  Skin:    General: Skin is warm and dry.  Neurological:     Mental Status: She is alert and oriented to person, place, and time.  Psychiatric:        Mood and Affect: Mood normal.        Behavior: Behavior normal.        Thought Content: Thought content normal.        Judgment: Judgment normal.     BP 127/84 (BP Location: Left Arm, Patient Position: Sitting, Cuff Size: Large)   Pulse 76   Resp 16   Wt 187 lb (84.8 kg)   SpO2 96%   BMI 34.20 kg/m  Wt Readings from Last 3 Encounters:  02/11/20 187 lb (84.8 kg)  11/11/19 190 lb 9.6 oz (86.5 kg)  10/29/19 192 lb (87.1 kg)     Health Maintenance Due  Topic Date Due  . COVID-19 Vaccine (1) Never done  . PAP SMEAR-Modifier  04/13/2020    There are no preventive care reminders to display for this patient.  Lab Results  Component Value Date   TSH 2.200 04/14/2017   Lab Results  Component Value Date   WBC 8.3 11/13/2019   HGB 13.3 11/13/2019   HCT 40.6 11/13/2019   MCV 90 11/13/2019   PLT 330 11/13/2019   Lab Results  Component Value Date   NA 143 11/13/2019   K 4.5 11/13/2019   CO2 22 11/13/2019   GLUCOSE 106 (H) 11/13/2019   BUN 12 11/13/2019   CREATININE 0.77 11/13/2019   BILITOT 0.2 11/13/2019   ALKPHOS 80 11/13/2019   AST 12 11/13/2019   ALT 19 11/13/2019   PROT 6.8 11/13/2019   ALBUMIN 4.1 11/13/2019   CALCIUM 9.4 11/13/2019   Lab Results  Component Value Date   CHOL 169 11/13/2019   Lab Results  Component Value Date   HDL 55  11/13/2019   Lab Results  Component Value Date   LDLCALC 91 11/13/2019   Lab Results  Component Value Date   TRIG  128 11/13/2019   Lab Results  Component Value Date   CHOLHDL 3.1 11/13/2019   Lab Results  Component Value Date   HGBA1C 6.4 (A) 02/11/2020      Assessment & Plan:  Angelena SoleSusana was seen today for diabetes and hypertension.  Diagnoses and all orders for this visit:  Type 2 diabetes mellitus without complication, without long-term current use of insulin (HCC) ADA recommends the following therapeutic goals for glycemic control related to A1c measurements: Goal of therapy: Less than 6.5 hemoglobin A1c.  Reference clinical practice recommendations.Foods that are high in carbohydrates are the following rice, potatoes, breads, sugars, and pastas.  Reduction in the intake (eating) will assist in lowering your blood sugars.-     HgB A1c -     metFORMIN (GLUCOPHAGE-XR) 500 MG 24 hr tablet; Take 1 tablet (500 mg total) by mouth 2 (two) times daily after a meal.  Chronic left shoulder pain -     celecoxib (CELEBREX) 200 MG capsule; Take 1 capsule (200 mg total) by mouth 2 (two) times daily.  Arthralgia, unspecified jointWork on losing weight to help reduce joint pain. May alternate with heat and ice application for pain relief. May also alternate with acetaminophen and Ibuprofen as prescribed pain relief. Other alternatives include massage, acupuncture and water aerobics.  You must stay active and avoid a sedentary lifestyle. -     celecoxib (CELEBREX) 200 MG capsule; Take 1 capsule (200 mg total) by mouth 2 (two) times daily.  Essential hypertension Counseled on blood pressure goal of less than 130/80, low-sodium, DASH diet, medication compliance, 150 minutes of moderate intensity exercise per week.   Current mild episode of major depressive disorder, unspecified whether recurrent (HCC)  FLUoxetine (PROZAC) 10 MG tablet; Take 2 tablets (20 mg total) by mouth daily.  Primary  insomnia Can try melatonin 5mg -15 mg at night for sleep, can also do benadryl 25-50mg  at night for sleep.  If this does not help we can try prescription medication.  Also here is some information about good sleep hygiene.   Other orders/Medication refil -     pantoprazole (PROTONIX) 40 MG tablet; Take 1 tablet (40 mg total) by mouth daily. -     triamcinolone cream (KENALOG) 0.1 %; Apply 1 application topically 2 (two) times daily. -     pravastatin (PRAVACHOL) 20 MG tablet; Take 1 tablet (20 mg total) by mouth daily. -     FLUoxetine (PROZAC) 10 MG tablet; Take 2 tablets (20 mg total) by mouth daily.    Meds ordered this encounter  Medications  . metFORMIN (GLUCOPHAGE-XR) 500 MG 24 hr tablet    Sig: Take 1 tablet (500 mg total) by mouth 2 (two) times daily after a meal.    Dispense:  180 tablet    Refill:  1  . celecoxib (CELEBREX) 200 MG capsule    Sig: Take 1 capsule (200 mg total) by mouth 2 (two) times daily.    Dispense:  180 capsule    Refill:  1  . pantoprazole (PROTONIX) 40 MG tablet    Sig: Take 1 tablet (40 mg total) by mouth daily.    Dispense:  30 tablet    Refill:  2  . triamcinolone cream (KENALOG) 0.1 %    Sig: Apply 1 application topically 2 (two) times daily.    Dispense:  30 g    Refill:  0  . pravastatin (PRAVACHOL) 20 MG tablet    Sig: Take 1 tablet (20 mg total) by mouth  daily.    Dispense:  90 tablet    Refill:  1  . FLUoxetine (PROZAC) 10 MG tablet    Sig: Take 2 tablets (20 mg total) by mouth daily.    Dispense:  90 tablet    Refill:  0    Follow-up: Return in about 7 weeks (around 03/31/2020) for medications follow up.    Grayce Sessions, NP

## 2020-02-13 ENCOUNTER — Other Ambulatory Visit: Payer: Self-pay

## 2020-02-13 NOTE — BH Specialist Note (Signed)
Integrated Behavioral Health Initial Visit  MRN: [id number] Name: Frances Martin  Number of Integrated Behavioral Health Clinician visits:: 1/6 Session Start time: 10:55 AM  Session End time: 11:10 AM Total time: 15  Type of Service: Integrated Behavioral Health- Individual Interpretor:Yes.   Interpretor Name and Language: Designer, jewellery Off Completed.       SUBJECTIVE: Frances Martin is a 59 y.o. female accompanied by self Patient was referred by NP Edwards for depression and anxiety. Patient reports the following symptoms/concerns: Pt reports difficulty managing depression and anxiety symptoms triggered by stress. Duration of problem: Ongoing; Severity of problem: moderate  OBJECTIVE: Mood: Appropriate and Affect: Appropriate Risk of harm to self or others: No plan to harm self or others  LIFE CONTEXT: Family and Social: Pt receives support from family and friends School/Work: Pt is uninsured Self-Care: Pt denies substance use hx. Pt is open to medication management Life Changes: Pt has difficulty managing mental and physical health triggered by psychosocial stressors  GOALS ADDRESSED: Patient will: 1. Reduce symptoms of: anxiety, depression and stress 2. Increase knowledge and/or ability of: coping skills and healthy habits  3. Demonstrate ability to: Increase healthy adjustment to current life circumstances and Increase adequate support systems for patient/family  INTERVENTIONS: Interventions utilized: Solution-Focused Strategies, Supportive Counseling and Psychoeducation and/or Health Education  Standardized Assessments completed: GAD-7 and PHQ 2&9  ASSESSMENT: Patient currently experiencing difficulty managing mental and physical health conditions triggered by psychosocial stressors.   Patient may benefit from medication management and therapy. Self-care strategies were discussed to assist in decrease and/or management of  symptoms.  PLAN: 1. Follow up with behavioral health clinician on : Contact LCSW with behavioral health and/or resource needs 2. Behavioral recommendations: Utilize strategies discussed and comply with medication management 3. Referral(s): Integrated Behavioral Health Services (In Clinic) 4. "From scale of 1-10, how likely are you to follow plan?":   Bridgett Larsson, LCSW 02/13/2020 1:43 AM

## 2020-03-10 ENCOUNTER — Other Ambulatory Visit (INDEPENDENT_AMBULATORY_CARE_PROVIDER_SITE_OTHER): Payer: Self-pay | Admitting: Primary Care

## 2020-03-10 MED FILL — ?PANTOPRAZOLE SODI DR 40MGT: 40 | 30 days supply | Qty: 30 | Fill #1

## 2020-03-10 MED FILL — ?PRAVASTATIN NA 20MG TABL: 20 | 30 days supply | Qty: 30 | Fill #1

## 2020-03-10 MED FILL — TRIAMCINOLONE ACETONIDE 0.1: 0.1 | 15 days supply | Qty: 30 | Fill #0

## 2020-03-10 MED FILL — ?CELECOXIB 200 MG CAPSULE: 200 | 30 days supply | Qty: 60 | Fill #1

## 2020-03-11 MED FILL — metFORMIN HCL ER 500 MG TB2: 500 | 30 days supply | Qty: 60 | Fill #1

## 2020-03-24 ENCOUNTER — Telehealth: Payer: Self-pay

## 2020-03-24 NOTE — Telephone Encounter (Signed)
Viann Shove, RN contacted patient regarding CPAP and explained that there is no hardship program available for patients that are uninsured and/or lack income and she would need to pay privately for the machine.  She was informed that the cost could be [phone].  She was interested in the exact cost.  Genella Rife also reminded her that the [employer] does not cover this machine. She was in agreement to having the order sent to [facility] and have them contact her with the cost of the machine,

## 2020-03-25 NOTE — Telephone Encounter (Signed)
Made NP aware / new orders were placed

## 2020-03-31 ENCOUNTER — Other Ambulatory Visit: Payer: Self-pay | Admitting: Primary Care

## 2020-03-31 ENCOUNTER — Encounter (INDEPENDENT_AMBULATORY_CARE_PROVIDER_SITE_OTHER): Payer: Self-pay | Admitting: Primary Care

## 2020-03-31 ENCOUNTER — Ambulatory Visit (INDEPENDENT_AMBULATORY_CARE_PROVIDER_SITE_OTHER): Payer: Self-pay | Admitting: Primary Care

## 2020-03-31 ENCOUNTER — Other Ambulatory Visit: Payer: Self-pay

## 2020-03-31 VITALS — BP 134/89 | HR 76 | Temp 98.1°F | Ht 62.0 in | Wt 186.6 lb

## 2020-03-31 DIAGNOSIS — M25512 Pain in left shoulder: Secondary | ICD-10-CM

## 2020-03-31 DIAGNOSIS — E119 Type 2 diabetes mellitus without complications: Secondary | ICD-10-CM

## 2020-03-31 DIAGNOSIS — M2550 Pain in unspecified joint: Secondary | ICD-10-CM

## 2020-03-31 DIAGNOSIS — G8929 Other chronic pain: Secondary | ICD-10-CM

## 2020-03-31 LAB — POCT GLYCOSYLATED HEMOGLOBIN (HGB A1C): Hemoglobin A1C: 6.4 % — AB (ref 4.0–5.6)

## 2020-03-31 LAB — POCT CBG (FASTING - GLUCOSE)-MANUAL ENTRY: Glucose Fasting, POC: 104 mg/dL — AB (ref 70–99)

## 2020-03-31 MED ORDER — PRAVASTATIN SODIUM 10 MG PO TABS: 20.0000 mg | ORAL_TABLET | Freq: Every day | ORAL | 1 refills | Status: DC

## 2020-03-31 MED ORDER — LOSARTAN POTASSIUM 50 MG PO TABS: 50.0000 mg | ORAL_TABLET | Freq: Every day | ORAL | 3 refills | Status: DC

## 2020-03-31 MED ORDER — METFORMIN HCL ER 500 MG PO TB24: 500.0000 mg | ORAL_TABLET | Freq: Two times a day (BID) | ORAL | 1 refills | Status: DC

## 2020-03-31 MED ORDER — PRAVASTATIN SODIUM 10 MG PO TABS: 10.0000 mg | ORAL_TABLET | Freq: Every day | ORAL | 1 refills | Status: DC

## 2020-03-31 MED ORDER — CELECOXIB 200 MG PO CAPS: 200.0000 mg | ORAL_CAPSULE | Freq: Two times a day (BID) | ORAL | 1 refills | Status: DC

## 2020-03-31 MED FILL — LOSARTAN POTASSIUM 50 MG TA: 50 | 30 days supply | Qty: 30 | Fill #0

## 2020-03-31 NOTE — Patient Instructions (Signed)
Prueba de Papanicolaou Pap Test Por qu me debo realizar esta prueba? La prueba de Papanicolaou, tambin denominada citologa vaginal, es una prueba de cribado para detectar signos de:  Cncer de la vagina, del cuello uterino y del tero. El cuello uterino es la parte baja del tero que se abre hacia la vagina.  Infeccin.  Cambios que podran ser un signo de que se est desarrollando un cncer (cambios precancerosos). Las mujeres deben realizarse esta prueba con regularidad. En general, debe hacerse una prueba de Papanicolaou cada 3 aos hasta alcanzar la menopausia o hasta los 65 aos. Las mujeres entre 30 y 60 aos de edad pueden elegir realizarse la prueba de Papanicolaou al mismo tiempo que la prueba del VPH (virus del papiloma humano) cada 5 aos (en lugar de cada 3 aos). El mdico puede recomendarle que se realice pruebas de Papanicolaou con ms o menos frecuencia en funcin de sus afecciones mdicas y los resultados de la prueba de Papanicolaou anterior. Qu tipo de muestra se toma?  El mdico recolectar una muestra de clulas de la superficie del cuello uterino. Lo har utilizando un pequeo hisopo de algodn, una esptula de plstico o un cepillo. Esta muestra se recolecta durante un examen plvico, mientras usted est recostada boca arriba sobre la mesa de examen con los pies en los descansos para pies (estribos). En algunos casos, tambin pueden recolectarse fluidos (secreciones) del cuello uterino y la vagina. Cmo debo prepararme para esta prueba?  Tenga en cuenta en qu etapa del ciclo menstrual se encuentra. Es posible que se le pida que vuelva a programar la prueba si est menstruando el da en que debe realizrsela.  Si el da en que debe realizarse la prueba tiene una infeccin vaginal aparente, deber volver a programar la prueba.  Siga las indicaciones del mdico acerca de lo siguiente: ? Cambiar o suspender los medicamentos que toma habitualmente. Algunos  medicamentos pueden provocar resultados anormales de la prueba, como los digitlicos y la tetraciclina. ? Evite las duchas vaginales o los baos de inmersin el da de la prueba o el da anterior. Informe al mdico acerca de lo siguiente:  Cualquier alergia que tenga.  Todos los medicamentos que utiliza, incluidos vitaminas, hierbas, gotas oftlmicas, cremas y medicamentos de venta libre.  Cualquier enfermedad de la sangre que tenga.  Cirugas a las que se someti.  Cualquier afeccin mdica que tenga.  Si est embarazada o podra estarlo. Cmo se informan los resultados? Los resultados de la prueba se informarn como anormales o normales. Puede producirse un resultado positivo falso. Este tipo de resultado es incorrecto porque indica que una enfermedad est presente cuando en realidad no lo est. Puede producirse un resultado negativo falso. Este tipo de resultado es incorrecto porque indica que una enfermedad no est presente cuando en realidad lo est. Qu significan los resultados? Un resultado normal en la prueba significa que no tiene signos de cncer de la vagina, del cuello uterino o del tero. Un resultado anormal puede significar que tiene:  Cncer. Una prueba de Papanicolaou por s sola no es suficiente para diagnosticar cncer. En este caso, se le realizarn ms pruebas.  Cambios precancerosos en la vagina, cuello uterino o tero.  Inflamacin del cuello uterino.  Enfermedades de transmisin sexual (ETS).  Infecciones por hongos.  Infecciones por parsitos. Hable con su mdico sobre lo que significan sus resultados. Preguntas para hacerle al mdico Consulte a su mdico o pregunte en el departamento donde se realiza la prueba acerca de lo siguiente:    Cundo estarn disponibles mis resultados?  Cmo obtendr mis resultados?  Cules son mis opciones de tratamiento?  Qu otras pruebas necesito?  Cules son los prximos pasos que debo seguir? Resumen  En  general, las mujeres deben hacerse una prueba de Papanicolaou cada 3 aos hasta alcanzar la menopausia o hasta los 65 aos de edad.  El mdico recolectar una muestra de clulas de la superficie del cuello uterino. Lo har utilizando un pequeo hisopo de algodn, una esptula de plstico o un cepillo.  En algunos casos, tambin pueden recolectarse fluidos (secreciones) del cuello uterino y la vagina. Esta informacin no tiene como fin reemplazar el consejo del mdico. Asegrese de hacerle al mdico cualquier pregunta que tenga. Document Revised: 07/11/2017 Document Reviewed: 07/11/2017 Elsevier Patient Education  2020 Elsevier Inc.  

## 2020-03-31 NOTE — Progress Notes (Signed)
Established Patient Office Visit  Subjective:  [name], female    DOB: 1961-06-07  Age: 59 y.o. MRN: [id number]  CC:  Chief Complaint  Patient presents with  . Diabetes  . Medication Management    HPI [name] is a 59 year old Hispanic female who speaks Spanish only interpreter [name] [phone] .presents for management of diabetes . She has a complaint right arm pain causing decreased range of motion when trying to use. Denies shortness of breath, headaches, chest pain or lower extremity edema.  She also denies signs and symptoms of polydipsia, polyuria, and polyphagia  Past Medical History:  Diagnosis Date  . Hemorrhoids   . Hyperlipidemia   . Hypertension   . Pre-diabetes     Past Surgical History:  Procedure Laterality Date  . CESAREAN SECTION     3 previous  . COLONOSCOPY WITH PROPOFOL N/A 11/14/2017   Procedure: COLONOSCOPY WITH PROPOFOL;  Surgeon: Jonathon Bellows, MD;  Location: [facility] ENDOSCOPY;  Service: Gastroenterology;  Laterality: N/A;  . COLONOSCOPY WITH PROPOFOL N/A 01/22/2018   Procedure: COLONOSCOPY WITH PROPOFOL;  Surgeon: Jonathon Bellows, MD;  Location: [facility] ENDOSCOPY;  Service: Gastroenterology;  Laterality: N/A;  . TUBAL LIGATION      Family History  Problem Relation Age of Onset  . Diabetes Mother   . Hypertension Mother   . Diabetes Father     Social History   Socioeconomic History  . Marital status: Married    Spouse name: Not on file  . Number of children: 3  . Years of education: Not on file  . Highest education level: 10th grade  Occupational History  . Not on file  Tobacco Use  . Smoking status: Current Every Day Smoker    Types: Cigarettes  . Smokeless tobacco: Never Used  . Tobacco comment: 3 a day  Vaping Use  . Vaping Use: Never used  Substance and Sexual Activity  . Alcohol use: No  . Drug use: No  . Sexual activity: Yes    Birth control/protection: Surgical  Other Topics Concern  . Not on  file  Social History Narrative  . Not on file   Social Determinants of Health   Financial Resource Strain:   . Difficulty of Paying Living Expenses:   Food Insecurity:   . Worried About Charity fundraiser in the Last Year:   . Arboriculturist in the Last Year:   Transportation Needs: No Transportation Needs  . Lack of Transportation (Medical): No  . Lack of Transportation (Non-Medical): No  Physical Activity:   . Days of Exercise per Week:   . Minutes of Exercise per Session:   Stress:   . Feeling of Stress :   Social Connections:   . Frequency of Communication with Friends and Family:   . Frequency of Social Gatherings with Friends and Family:   . Attends Religious Services:   . Active Member of Clubs or Organizations:   . Attends Archivist Meetings:   [name] Marital Status:   Intimate Partner Violence:   . Fear of Current or Ex-Partner:   . Emotionally Abused:   [name] Physically Abused:   . Sexually Abused:     Outpatient Medications Prior to Visit  Medication Sig Dispense Refill  . aspirin EC 81 MG tablet Take 81 mg by mouth daily.    [name] FLUoxetine (PROZAC) 10 MG tablet Take 2 tablets (20 mg total) by mouth daily. 90 tablet 0  . pantoprazole (PROTONIX) 40  MG tablet Take 1 tablet (40 mg total) by mouth daily. 30 tablet 2  . triamcinolone cream (KENALOG) 0.1 % APPLY 1 APPLICATION TOPICALLY 2 (TWO) TIMES DAILY. 30 g 0  . celecoxib (CELEBREX) 200 MG capsule Take 1 capsule (200 mg total) by mouth 2 (two) times daily. 180 capsule 1  . losartan (COZAAR) 50 MG tablet Take 1 tablet (50 mg total) by mouth daily. 90 tablet 3  . metFORMIN (GLUCOPHAGE-XR) 500 MG 24 hr tablet Take 1 tablet (500 mg total) by mouth 2 (two) times daily after a meal. 180 tablet 1  . pravastatin (PRAVACHOL) 20 MG tablet Take 1 tablet (20 mg total) by mouth daily. 90 tablet 1   No facility-administered medications prior to visit.    No Known Allergies  ROS Review of Systems  Musculoskeletal:        Right shoulder pain  Psychiatric/Behavioral:       Stopped antidepressant states feels ok   All other systems reviewed and are negative.     Objective:    Physical Exam Vitals reviewed.  Constitutional:      Appearance: She is obese.  HENT:     Head: Normocephalic.  Eyes:     Extraocular Movements: Extraocular movements intact.     Pupils: Pupils are equal, round, and reactive to light.  Cardiovascular:     Rate and Rhythm: Normal rate and regular rhythm.     Pulses: Normal pulses.     Heart sounds: Normal heart sounds.  Pulmonary:     Effort: Pulmonary effort is normal.     Breath sounds: Normal breath sounds.  Abdominal:     General: Bowel sounds are normal.     Palpations: Abdomen is soft.  Musculoskeletal:        General: Normal range of motion.     Cervical back: Normal range of motion and neck supple.     Comments: Decrease ROM right arm   Skin:    General: Skin is warm and dry.  Neurological:     Mental Status: She is alert and oriented to person, place, and time.  Psychiatric:        Mood and Affect: Mood normal.        Behavior: Behavior normal.        Thought Content: Thought content normal.        Judgment: Judgment normal.     BP 134/89 (BP Location: Right Arm, Patient Position: Sitting, Cuff Size: Normal)   Pulse 76   Temp 98.1 F (36.7 C) (Oral)   Ht _0  (1.575 m)   Wt 186 lb 9.6 oz (84.6 kg)   SpO2 94%   BMI 34.13 kg/m  Wt Readings from Last 3 Encounters:  03/31/20 186 lb 9.6 oz (84.6 kg)  02/11/20 187 lb (84.8 kg)  11/11/19 190 lb 9.6 oz (86.5 kg)     Health Maintenance Due  Topic Date Due  . COVID-19 Vaccine (1) Never done  . INFLUENZA VACCINE  03/15/2020  . PAP SMEAR-Modifier  04/13/2020    There are no preventive care reminders to display for this patient.  Lab Results  Component Value Date   TSH 2.200 04/14/2017   Lab Results  Component Value Date   WBC 8.3 11/13/2019   HGB 13.3 11/13/2019   HCT 40.6 11/13/2019   MCV  90 11/13/2019   PLT 330 11/13/2019   Lab Results  Component Value Date   NA 143 11/13/2019   K 4.5 11/13/2019  CO2 22 11/13/2019   GLUCOSE 106 (H) 11/13/2019   BUN 12 11/13/2019   CREATININE 0.77 11/13/2019   BILITOT 0.2 11/13/2019   ALKPHOS 80 11/13/2019   AST 12 11/13/2019   ALT 19 11/13/2019   PROT 6.8 11/13/2019   ALBUMIN 4.1 11/13/2019   CALCIUM 9.4 11/13/2019   Lab Results  Component Value Date   CHOL 169 11/13/2019   Lab Results  Component Value Date   HDL 55 11/13/2019   Lab Results  Component Value Date   LDLCALC 91 11/13/2019   Lab Results  Component Value Date   TRIG 128 11/13/2019   Lab Results  Component Value Date   CHOLHDL 3.1 11/13/2019   Lab Results  Component Value Date   HGBA1C 6.4 (A) 03/31/2020   Rhaya was seen today for diabetes and medication management.  Diagnoses and all orders for this visit:  Type 2 diabetes mellitus without complication, without long-term current use of insulin (HCC) Well controlled on metformin 548m XR BID .  Goal of therapy: met 6.4 whic is less than 6.5 hemoglobin A1c.  Continue to decrease foods that are high in carbohydrates are the following rice, potatoes, breads, sugars, and pastas.  Reduction in the intake (eating) will assist in lowering your blood sugars. -     HgB A1c 6.4 -     Glucose (CBG), Fasting -     metFORMIN (GLUCOPHAGE-XR) 500 MG 24 hr tablet; Take 1 tablet (500 mg total) by mouth 2 (two) times daily after a meal.  Chronic left shoulder pain -     celecoxib (CELEBREX) 200 MG capsule; Take 1 capsule (200 mg total) by mouth 2 (two) times daily. -     Ambulatory referral to Orthopedic Surgery  Arthralgia, unspecified joint -     celecoxib (CELEBREX) 200 MG capsule; Take 1 capsule (200 mg total) by mouth 2 (two) times daily.  Other orders -     pravastatin (PRAVACHOL) 10 MG tablet; Take 2 tablets (20 mg total) by mouth daily. -     losartan (COZAAR) 50 MG tablet; Take 1 tablet (50 mg total)  by mouth daily.    Follow-up: Return in about 6 months (around 10/01/2020) for schedule for pap/ in person Bp/DM.    MKerin Perna NP

## 2020-04-06 ENCOUNTER — Other Ambulatory Visit (INDEPENDENT_AMBULATORY_CARE_PROVIDER_SITE_OTHER): Payer: Self-pay | Admitting: Primary Care

## 2020-04-06 ENCOUNTER — Ambulatory Visit (INDEPENDENT_AMBULATORY_CARE_PROVIDER_SITE_OTHER): Payer: Self-pay | Admitting: Surgical

## 2020-04-06 ENCOUNTER — Ambulatory Visit (INDEPENDENT_AMBULATORY_CARE_PROVIDER_SITE_OTHER): Payer: Self-pay

## 2020-04-06 DIAGNOSIS — M7541 Impingement syndrome of right shoulder: Secondary | ICD-10-CM

## 2020-04-06 DIAGNOSIS — M7711 Lateral epicondylitis, right elbow: Secondary | ICD-10-CM

## 2020-04-06 DIAGNOSIS — M79601 Pain in right arm: Secondary | ICD-10-CM

## 2020-04-06 MED FILL — TRIAMCINOLONE ACETONIDE 0.1: 0.1 | 7 days supply | Qty: 30 | Fill #0

## 2020-04-06 MED FILL — ?CELECOXIB 200 MG CAPSULE: 200 | 30 days supply | Qty: 60 | Fill #2

## 2020-04-06 MED FILL — PANTOPRAZOLE SOD DR 40 MG T: 40 | 30 days supply | Qty: 30 | Fill #2

## 2020-04-06 MED FILL — metFORMIN HCL ER 500 MG TB2: 500 | 30 days supply | Qty: 60 | Fill #2

## 2020-04-06 MED FILL — ?PRAVASTATIN NA 20 MG TAB: 20 | 30 days supply | Qty: 30 | Fill #2

## 2020-04-10 ENCOUNTER — Other Ambulatory Visit: Payer: Self-pay

## 2020-04-10 ENCOUNTER — Ambulatory Visit: Payer: No Typology Code available for payment source

## 2020-04-12 ENCOUNTER — Encounter: Payer: Self-pay | Admitting: Surgical

## 2020-04-12 DIAGNOSIS — M7541 Impingement syndrome of right shoulder: Secondary | ICD-10-CM

## 2020-04-12 MED ORDER — BUPIVACAINE HCL 0.5 % IJ SOLN
9.0000 mL | INTRAMUSCULAR | Status: AC | PRN
Start: 2020-04-12 — End: 2020-04-12
  Administered 2020-04-12: 9 mL via INTRA_ARTICULAR

## 2020-04-12 MED ORDER — METHYLPREDNISOLONE ACETATE 40 MG/ML IJ SUSP
40.0000 mg | INTRAMUSCULAR | Status: AC | PRN
  Administered 2020-04-12: 40 mg via INTRA_ARTICULAR

## 2020-04-12 MED ORDER — LIDOCAINE HCL 1 % IJ SOLN
5.0000 mL | INTRAMUSCULAR | Status: AC | PRN
  Administered 2020-04-12: 5 mL

## 2020-04-12 NOTE — Progress Notes (Signed)
Office Visit Note   Patient: Frances Martin           Date of Birth: 03/25/1961           MRN: [id number] Visit Date: 04/06/2020 Requested by: Grayce Sessions, NP [address] [place],  [place] [address] PCP: Grayce Sessions, NP  Subjective: Chief Complaint  Patient presents with  . Right Shoulder - Pain    HPI: Frances Martin is a 59 y.o. female who presents to the office complaining of right shoulder and arm pain.  Patient notes ongoing pain over the last 6 months.  Denies any acute injury leading to onset of pain.  Localizes pain to the lateral aspect of the right shoulder with mid humeral pain.  Pain is worse with lifting her arm up..  Pain is waking her up at night on occasion.  She does note pain when putting on her bra.  Pain does travel down to her elbow.  Denies any neck pain, numbness/tingling..  Denies any history of right shoulder surgery, dislocation.  She has a history of prediabetes for which she is taking Metformin.  She does not have an occupation.  PCP provided Celebrex that gave some relief of symptoms.  She also notes right elbow pain that she localizes to the lateral epicondyle.  She denies any injury leading to onset of epicondyle pain.              ROS: All systems reviewed are negative as they relate to the chief complaint within the history of present illness.  Patient denies fevers or chills.  Assessment & Plan: Visit Diagnoses:  1. Right arm pain   2. Impingement syndrome of right shoulder   3. Right tennis elbow     Plan: Patient is a 59 year old female who presents complaining of right shoulder pain.  Localizes majority of her pain to the mid humeral area but pain is worse on exam with lifting up her shoulder.  No loss of motion.  No loss of strength.  Radiographs of the right shoulder are negative for any acute pathology.  Differential diagnosis includes bicep tendinitis, labral tear, partial tear of the rotator cuff, shoulder  impingement.  Administered subacromial injection at the right shoulder today.  Patient tolerated the procedure well.  Regarding the right elbow pain, impression is tennis elbow.  Recommended she try stretching and see how it goes over the next 4 weeks.  Follow-up in 4 weeks for clinical recheck.  Follow-Up Instructions: No follow-ups on file.   Orders:  Orders Placed This Encounter  Procedures  . XR Shoulder Right   No orders of the defined types were placed in this encounter.     Procedures: Large Joint Inj: R subacromial bursa on 04/12/2020 3:03 PM Indications: diagnostic evaluation and pain Details: 18 G 1.5 in needle, posterior approach  Arthrogram: No  Medications: 9 mL bupivacaine 0.5 %; 40 mg methylPREDNISolone acetate 40 MG/ML; 5 mL lidocaine 1 % Outcome: tolerated well, no immediate complications Procedure, treatment alternatives, risks and benefits explained, specific risks discussed. Consent was given by the patient. Immediately prior to procedure a time out was called to verify the correct patient, procedure, equipment, support staff and site/side marked as required. Patient was prepped and draped in the usual sterile fashion.       Clinical Data: No additional findings.  Objective: Vital Signs: There were no vitals taken for this visit.  Physical Exam:  Constitutional: Patient appears well-developed HEENT:  Head: Normocephalic Eyes:EOM are  normal Neck: Normal range of motion Cardiovascular: Normal rate Pulmonary/chest: Effort normal Neurologic: Patient is alert Skin: Skin is warm Psychiatric: Patient has normal mood and affect  Ortho Exam: Ortho exam demonstrates right shoulder with 75 degrees external rotation, 120 degrees abduction, 160 degrees forward flexion.  Excellent range of motion that is comparable to the contralateral side.  5/5 motor strength of the bilateral supraspinatus, infraspinatus, subscapularis, finger abduction, grip strength,  pronation/supination, bicep, tricep, deltoid.  Mild tenderness over the [facility] joint.  Moderate tenderness over the bicipital groove and the bicep muscle belly.  Positive O'Brien sign.  No tenderness over the axial cervical spine.  Positive impingement sign with Juanetta Gosling and Neer's.  Tender to palpation over the lateral epicondyle.  No tenderness over the medial epicondyle, bicep tendon, tricep tendon, olecranon bursa, radial nerve.  Excellent range of motion of the right elbow with 0 degrees extension, greater than 120 degrees of flexion.  Pain elicited at the lateral epicondyle with resisted grip strength and resisted wrist extension.  Specialty Comments:  No specialty comments available.  Imaging: No results found.   PMFS History: Patient Active Problem List   Diagnosis Date Noted  . OSA (obstructive sleep apnea) 03/11/2019  . Hemorrhoids 08/21/2017   Past Medical History:  Diagnosis Date  . Hemorrhoids   . Hyperlipidemia   . Hypertension   . Pre-diabetes     Family History  Problem Relation Age of Onset  . Diabetes Mother   . Hypertension Mother   . Diabetes Father     Past Surgical History:  Procedure Laterality Date  . CESAREAN SECTION     3 previous  . COLONOSCOPY WITH PROPOFOL N/A 11/14/2017   Procedure: COLONOSCOPY WITH PROPOFOL;  Surgeon: [name], MD;  Location: [facility] ENDOSCOPY;  Service: Gastroenterology;  Laterality: N/A;  . COLONOSCOPY WITH PROPOFOL N/A 01/22/2018   Procedure: COLONOSCOPY WITH PROPOFOL;  Surgeon: [name], MD;  Location: [facility] ENDOSCOPY;  Service: Gastroenterology;  Laterality: N/A;  . TUBAL LIGATION     Social History   Occupational History  . Not on file  Tobacco Use  . Smoking status: Current Every Day Smoker    Types: Cigarettes  . Smokeless tobacco: Never Used  . Tobacco comment: 3 a day  Vaping Use  . Vaping Use: Never used  Substance and Sexual Activity  . Alcohol use: No  . Drug use: No  . Sexual activity: Yes    Birth  control/protection: Surgical

## 2020-04-15 ENCOUNTER — Other Ambulatory Visit (HOSPITAL_COMMUNITY)
Admission: RE | Admit: 2020-04-15 | Discharge: 2020-04-15 | Disposition: A | Discharge status: Home / Self Care | Payer: Self-pay | Source: Ambulatory Visit | Attending: Primary Care | Admitting: Primary Care

## 2020-04-15 ENCOUNTER — Ambulatory Visit (INDEPENDENT_AMBULATORY_CARE_PROVIDER_SITE_OTHER): Payer: Self-pay | Admitting: Primary Care

## 2020-04-15 ENCOUNTER — Other Ambulatory Visit: Payer: Self-pay

## 2020-04-15 ENCOUNTER — Encounter (INDEPENDENT_AMBULATORY_CARE_PROVIDER_SITE_OTHER): Payer: Self-pay | Admitting: Primary Care

## 2020-04-15 VITALS — BP 136/82 | HR 83 | Temp 97.9°F | Ht 62.0 in | Wt 184.8 lb

## 2020-04-15 DIAGNOSIS — Z113 Encounter for screening for infections with a predominantly sexual mode of transmission: Secondary | ICD-10-CM

## 2020-04-15 DIAGNOSIS — Z23 Encounter for immunization: Secondary | ICD-10-CM

## 2020-04-15 DIAGNOSIS — M238X1 Other internal derangements of right knee: Secondary | ICD-10-CM

## 2020-04-15 DIAGNOSIS — Z124 Encounter for screening for malignant neoplasm of cervix: Secondary | ICD-10-CM

## 2020-04-15 NOTE — Patient Instructions (Signed)
Gripe en los adultos Influenza, Adult La gripe, tambin llamada "influenza", es una infeccin viral que afecta, principalmente, las vas respiratorias. Las vas respiratorias incluyen rganos que ayudan a respirar, como los pulmones, la nariz y la garganta. La gripe provoca muchos sntomas similares a los del resfro comn, junto con fiebre alta y dolor corporal. Se transmite fcilmente de persona a persona (es contagiosa). La mejor manera de prevenir la gripe es aplicndose la vacuna contra la gripe todos los aos. Cules son las causas? La causa de esta afeccin es el virus de la influenza. Puede contraer el virus de las siguientes maneras:  Al inhalar las gotitas que estn en el aire liberadas por la tos o el estornudo de una persona infectada.  Al tocar algo que estuvo expuesto al virus (fue contaminado) y luego tocarse la boca, nariz u ojos. Qu incrementa el riesgo? Los siguientes factores pueden hacer que usted sea propenso a contraer la gripe:  No lavarse o desinfectarse las manos con frecuencia.  Tener contacto cercano con muchas personas durante la temporada de resfro y gripe.  Tocarse la boca, los ojos o la nariz sin antes lavarse ni desinfectarse las manos.  No colocarse la vacuna anual contra la gripe. Puede correr un mayor riesgo de tener gripe, incluso problemas graves como una infeccin pulmonar (neumona), si:  Es mayor de 65 aos de edad.  Est embarazada.  Tiene debilitado el sistema que combate las enfermedades (sistema inmunitario). Puede tener un sistema inmunitario debilitado si: ? Tienen VIH o sndrome de inmunodeficiencia adquirida (SIDA). ? Est recibiendo quimioterapia. ? Usa medicamentos que reducen (suprimen) la actividad de su sistema inmunitario.  Tiene una enfermedad a largo plazo (crnica), como una enfermedad cardaca, enfermedad renal, diabetes o enfermedad pulmonar.  Tiene un trastorno heptico.  Tiene mucho sobrepeso (obesidad  mrbida).  Tiene anemia. Esta es una afeccin que afecta a los glbulos rojos.  Tiene asma. Cules son los signos o los sntomas? Los sntomas de esta afeccin por lo general comienzan de repente y duran entre 4 y 14das. Pueden incluir los siguientes:  Fiebre y escalofros.  Dolores de cabeza, dolores en el cuerpo o dolores musculares.  Dolor de garganta.  Tos.  Secrecin o congestin nasal.  Malestar en el pecho.  Falta de apetito.  Debilidad o fatiga.  Mareos.  Nuseas o vmitos. Cmo se diagnostica? Esta afeccin se puede diagnosticar en funcin de lo siguiente:  Los sntomas y los antecedentes mdicos.  Un examen fsico.  Un hisopado de nariz o garganta y el anlisis del lquido extrado para detectar el virus de la gripe. Cmo se trata? Si la gripe se diagnostica de forma temprana, puede tratarse con medicamentos que pueden ayudar a reducir la gravedad de la enfermedad y reducir su duracin (medicamentos antivirales). Estos pueden administrarse por boca (va oral) o por va intravenosa. Cuidarse en su hogar tambin puede ayudar a aliviar los sntomas. El mdico puede recomendarle lo siguiente:  Tomar medicamentos de venta libre.  Beber mucho lquido. En muchos casos, la gripe desaparece sola. Si tiene sntomas graves o complicaciones, puede tratarse en un hospital. Siga estas indicaciones en su casa: Actividad  Descanse segn sea necesario y duerma bien.  Qudese en su casa y no concurra al trabajo o a la escuela como se lo haya indicado su mdico. A menos que visite al mdico, evite salir de su casa hasta que la fiebre haya desaparecido por 24 horas sin tomar medicamentos. Comida y bebida  Tome una solucin de rehidratacin oral (  oral rehydration solution, ORS). Esta es una bebida que se vende en farmacias y tiendas minoristas.  Beba suficiente lquido como para mantener la orina de color amarillo plido.  En la medida en que pueda, beba lquidos  claros en pequeas cantidades. Beba lquidos claros, como agua, cubitos de hielo, jugos de fruta diluidos y bebidas deportivas bajas en caloras.  En la medida en que pueda, consuma alimentos blandos y fciles de digerir en pequeas cantidades. Estos alimentos incluyen bananas, compota de manzana, arroz, carnes magras, tostadas y galletas saladas.  Evite consumir lquidos que contengan mucha azcar o cafena, como bebidas energticas, bebidas deportivas comunes y refrescos.  Evite tomar alcohol.  Evite los alimentos condimentados o con alto contenido de grasa. Indicaciones generales      Tome los medicamentos de venta libre y los recetados solamente como se lo haya indicado el mdico.  Use un humidificador de aire fro para agregar humedad al aire de su casa. Esto puede facilitar la respiracin.  Al toser o estornudar, cbrase la boca y la nariz.  Lvese las manos con agua y jabn frecuentemente, en especial despus de toser o estornudar. Use desinfectante para manos con alcohol si no dispone de agua y jabn.  Concurra a todas las visitas de control como se lo haya indicado el mdico. Esto es importante. Cmo se evita?   Colquese la vacuna anual contra la gripe. Puede colocarse la vacuna contra la gripe a fines de verano, en otoo o en invierno. Pregntele al mdico cundo debe colocarse la vacuna contra la gripe.  Evite el contacto con personas que estn enfermas durante la temporada de resfro y gripe. Generalmente es durante el otoo y el invierno. Comunquese con un mdico si:  Tiene nuevos sntomas.  Tiene los siguientes sntomas: ? Dolor en el pecho. ? Diarrea. ? Fiebre.  La tos empeora.  Produce ms mucosidad.  Siente nuseas o vomita. Solicite ayuda inmediatamente si:  Le falta el aire o tiene dificultad para respirar.  La piel o las uas se tornan de un color azulado.  Presenta dolor intenso o rigidez de cuello.  Le duele la cabeza, la cara o el odo de  forma repentina.  No puede comer ni beber sin vomitar. Resumen  La gripe es una infeccin viral que afecta principalmente las vas respiratorias.  Los sntomas de la gripe normalmente comienzan de repente y duran entre 4 y 14 das.  Colocarse la vacuna anual contra la gripe es la mejor manera de prevenir el contagio de la gripe.  Qudese en su casa y no concurra al trabajo o a la escuela como se lo haya indicado su mdico. A menos que visite al mdico, evite salir de su casa hasta que la fiebre haya desaparecido por 24 horas sin tomar medicamentos.  Concurra a todas las visitas de control como se lo haya indicado el mdico. Esto es importante. Esta informacin no tiene como fin reemplazar el consejo del mdico. Asegrese de hacerle al mdico cualquier pregunta que tenga. Document Revised: 03/14/2018 Document Reviewed: 03/14/2018 Elsevier Patient Education  2020 Elsevier Inc.  

## 2020-04-15 NOTE — Progress Notes (Signed)
Established Patient Office Visit  Subjective:  [name], female    DOB: 03/28/1961  Age: 59 y.o. MRN: [id number]  CC:  Chief Complaint  Patient presents with   Gynecologic Exam    HPI [name] is a 59 year old Hispanic who presents for GYN appointment . Only complaint is right knee pain.  Past Medical History:  Diagnosis Date   Hemorrhoids    Hyperlipidemia    Hypertension    Pre-diabetes     Past Surgical History:  Procedure Laterality Date   CESAREAN SECTION     3 previous   COLONOSCOPY WITH PROPOFOL N/A 11/14/2017   Procedure: COLONOSCOPY WITH PROPOFOL;  Surgeon: [name], MD;  Location: [facility] ENDOSCOPY;  Service: Gastroenterology;  Laterality: N/A;   COLONOSCOPY WITH PROPOFOL N/A 01/22/2018   Procedure: COLONOSCOPY WITH PROPOFOL;  Surgeon: [name], MD;  Location: [facility] ENDOSCOPY;  Service: Gastroenterology;  Laterality: N/A;   TUBAL LIGATION      Family History  Problem Relation Age of Onset   Diabetes Mother    Hypertension Mother    Diabetes Father     Social History   Socioeconomic History   Marital status: Married    Spouse name: Not on file   Number of children: 3   Years of education: Not on file   Highest education level: 10th grade  Occupational History   Not on file  Tobacco Use   Smoking status: Current Every Day Smoker    Types: Cigarettes   Smokeless tobacco: Never Used   Tobacco comment: 3 a day  Vaping Use   Vaping Use: Never used  Substance and Sexual Activity   Alcohol use: No   Drug use: No   Sexual activity: Yes    Birth control/protection: Surgical  Other Topics Concern   Not on file  Social History Narrative   Not on file   Social Determinants of Health   Financial Resource Strain:    Difficulty of Paying Living Expenses: Not on file  Food Insecurity:    Worried About Programme researcher, broadcasting/film/video in the Last Year: Not on file   [employer] of Food in the Last  Year: Not on file  Transportation Needs: No Transportation Needs   Lack of Transportation (Medical): No   Lack of Transportation (Non-Medical): No  Physical Activity:    Days of Exercise per Week: Not on file   Minutes of Exercise per Session: Not on file  Stress:    Feeling of Stress : Not on file  Social Connections:    Frequency of Communication with Friends and Family: Not on file   Frequency of Social Gatherings with Friends and Family: Not on file   Attends Religious Services: Not on file   Active Member of Clubs or Organizations: Not on file   Attends Banker Meetings: Not on file   Marital Status: Not on file  Intimate Partner Violence:    Fear of Current or Ex-Partner: Not on file   Emotionally Abused: Not on file   Physically Abused: Not on file   Sexually Abused: Not on file    Outpatient Medications Prior to Visit  Medication Sig Dispense Refill   aspirin EC 81 MG tablet Take 81 mg by mouth daily.     celecoxib (CELEBREX) 200 MG capsule Take 1 capsule (200 mg total) by mouth 2 (two) times daily. 180 capsule 1   FLUoxetine (PROZAC) 10 MG tablet Take 2 tablets (20 mg  total) by mouth daily. 90 tablet 0   losartan (COZAAR) 50 MG tablet Take 1 tablet (50 mg total) by mouth daily. 90 tablet 3   metFORMIN (GLUCOPHAGE-XR) 500 MG 24 hr tablet Take 1 tablet (500 mg total) by mouth 2 (two) times daily after a meal. 180 tablet 1   pantoprazole (PROTONIX) 40 MG tablet Take 1 tablet (40 mg total) by mouth daily. 30 tablet 2   pravastatin (PRAVACHOL) 10 MG tablet Take 1 tablet (10 mg total) by mouth daily. 90 tablet 1   triamcinolone cream (KENALOG) 0.1 % APPLY 1 APPLICATION TOPICALLY 2 (TWO) TIMES DAILY. 30 g 0   No facility-administered medications prior to visit.    No Known Allergies  ROS Review of Systems  All other systems reviewed and are negative.     Objective:    Physical Exam CONSTITUTIONAL: Well-developed, well-nourished  obese female in no acute distress.  HENT:  Normocephalic, atraumatic, External right and left ear normal.  EYES: Conjunctivae and EOM are normal. Pupils are equal, round, and reactive to light. No scleral icterus.  NECK: Normal range of motion, supple, no masses.  Normal thyroid.  SKIN: Skin is warm and dry. No rash noted. Not diaphoretic. No erythema. No pallor. NEUROLGIC: Alert and oriented to person, place, and time. Normal reflexes, muscle tone coordination. No cranial nerve deficit noted. PSYCHIATRIC: Normal mood and affect. Normal behavior. Normal judgment and thought content. CARDIOVASCULAR: Normal heart rate noted, regular rhythm RESPIRATORY: Clear to auscultation bilaterally. Effort and breath sounds normal, no problems with respiration noted. BREASTS: Taught SBE mammogram annually  ABDOMEN: Soft, normal bowel sounds, no distention noted.  No tenderness, rebound or guarding.  PELVIC: Normal appearing external genitalia; normal appearing vaginal mucosa and cervix.  No abnormal discharge noted.  Pap smear obtained.  Normal uterine size, no other palpable masses, no uterine or adnexal tenderness. MUSCULOSKELETAL: Normal range of motion.crupitus in right knee No cyanosis, clubbing, or edema.  2+ distal pulses. BP 136/82 (BP Location: Right Arm, Patient Position: Sitting, Cuff Size: Large)    Pulse 83    Temp 97.9 F (36.6 C) (Oral)    Ht 5\' 2"  (1.575 m)    Wt 184 lb 12.8 oz (83.8 kg)    SpO2 98%    BMI 33.80 kg/m  Wt Readings from Last 3 Encounters:  04/15/20 184 lb 12.8 oz (83.8 kg)  03/31/20 186 lb 9.6 oz (84.6 kg)  02/11/20 187 lb (84.8 kg)     Health Maintenance Due  Topic Date Due   COVID-19 Vaccine (1) Never done   PAP SMEAR-Modifier  04/13/2020    There are no preventive care reminders to display for this patient.  Lab Results  Component Value Date   TSH 2.200 04/14/2017   Lab Results  Component Value Date   WBC 8.3 11/13/2019   HGB 13.3 11/13/2019   HCT 40.6  11/13/2019   MCV 90 11/13/2019   PLT 330 11/13/2019   Lab Results  Component Value Date   NA 143 11/13/2019   K 4.5 11/13/2019   CO2 22 11/13/2019   GLUCOSE 106 (H) 11/13/2019   BUN 12 11/13/2019   CREATININE 0.77 11/13/2019   BILITOT 0.2 11/13/2019   ALKPHOS 80 11/13/2019   AST 12 11/13/2019   ALT 19 11/13/2019   PROT 6.8 11/13/2019   ALBUMIN 4.1 11/13/2019   CALCIUM 9.4 11/13/2019   Lab Results  Component Value Date   CHOL 169 11/13/2019   Lab Results  Component Value Date  HDL 55 11/13/2019   Lab Results  Component Value Date   LDLCALC 91 11/13/2019   Lab Results  Component Value Date   TRIG 128 11/13/2019   Lab Results  Component Value Date   CHOLHDL 3.1 11/13/2019   Lab Results  Component Value Date   HGBA1C 6.4 (A) 03/31/2020      Assessment & Plan:  Frances Martin was seen today for gynecologic exam.  Diagnoses and all orders for this visit:  Need for immunization against influenza -     Flu Vaccine QUAD 36+ mos IM  Cervical cancer screening -     Cytology - PAP([facility])  Screening for STD (sexually transmitted disease) -     Cervicovaginal ancillary only  Crepitus of joint of right knee Work on losing weight to help reduce joint pain. May alternate with heat and ice application for pain relief. May also alternate with acetaminophen and Ibuprofen as prescribed pain relief. Other alternatives include massage, acupuncture and water aerobics.  You must stay active and avoid a sedentary lifestyle. Refer to ortho  Follow-up: Return in about 6 months (around 10/13/2020) for Fasting labs hyperlipidemia and HTN .    Grayce Sessions, NP

## 2020-04-16 LAB — CYTOLOGY - PAP
Adequacy: ABSENT
Diagnosis: NEGATIVE

## 2020-04-16 LAB — CERVICOVAGINAL ANCILLARY ONLY
Bacterial Vaginitis (gardnerella): NEGATIVE
Candida Glabrata: NEGATIVE
Candida Vaginitis: NEGATIVE
Chlamydia: NEGATIVE
Comment: NEGATIVE
Comment: NEGATIVE
Comment: NEGATIVE
Comment: NEGATIVE
Comment: NEGATIVE
Comment: NORMAL
Neisseria Gonorrhea: NEGATIVE
Trichomonas: NEGATIVE

## 2020-04-23 ENCOUNTER — Telehealth: Payer: Self-pay

## 2020-04-23 NOTE — Telephone Encounter (Signed)
Order doe CPAP has been sent to [facility] with note that the patient is uninsured and is interested in the cost of the machine

## 2020-05-04 ENCOUNTER — Ambulatory Visit (INDEPENDENT_AMBULATORY_CARE_PROVIDER_SITE_OTHER): Payer: Self-pay | Admitting: Orthopedic Surgery

## 2020-05-04 ENCOUNTER — Encounter: Payer: Self-pay | Admitting: Orthopedic Surgery

## 2020-05-04 VITALS — Ht 62.0 in | Wt 184.0 lb

## 2020-05-04 DIAGNOSIS — M7541 Impingement syndrome of right shoulder: Secondary | ICD-10-CM

## 2020-05-05 ENCOUNTER — Telehealth: Payer: Self-pay

## 2020-05-05 NOTE — Telephone Encounter (Signed)
Call placed to [facility] # [phone] to check on the status of the order for CPAP machine.  Spoke to [place] who said she will call the patient to inquire if she has been contacted about this order. Dorann Lodge was not able to check the status of the order based on the information that she had.

## 2020-05-07 ENCOUNTER — Encounter: Payer: Self-pay | Admitting: Orthopedic Surgery

## 2020-05-07 NOTE — Progress Notes (Signed)
Office Visit Note   Patient: Frances Martin           Date of Birth: [birth year]           MRN: [id number] Visit Date: 05/04/2020 Requested by: Grayce Sessions, NP [address] [place],  [place] [address] PCP: Grayce Sessions, NP  Subjective: Chief Complaint  Patient presents with  . Right Shoulder - Follow-up, Pain    HPI: Patient presents for follow-up of right shoulder.  Had injection 04/06/2020.  Injection in the subacromial space helped a lot.  The pain has decreased.  Not taking any medication for pain.  She is not very keen on any type of surgical intervention for any pathology that she may have in the shoulder.  States the shoulder is about 70% better.  The pain no longer wakes her from sleep.  She likes to exercise at the gym.  Does describe some deltoid pain.              ROS: All systems reviewed are negative as they relate to the chief complaint within the history of present illness.  Patient denies  fevers or chills.   Assessment & Plan: Visit Diagnoses:  1. Impingement syndrome of right shoulder     Plan: Impression is right shoulder pain improved with subacromial injection.  She would like to have complete relief but does not really want to have any intervention other than injection on her shoulder.  She does have a little coarseness on passive range of motion today above shoulder level.  I would favor 4-week return with Franky Macho for decision of repeat injection versus and/or MRI scan at that time  Follow-Up Instructions: No follow-ups on file.   Orders:  No orders of the defined types were placed in this encounter.  No orders of the defined types were placed in this encounter.     Procedures: No procedures performed   Clinical Data: No additional findings.  Objective: Vital Signs: Ht 5\' 2"  (1.575 m)   Wt 184 lb (83.5 kg)   BMI 33.65 kg/m   Physical Exam:   Constitutional: Patient appears well-developed HEENT:  Head:  Normocephalic Eyes:EOM are normal Neck: Normal range of motion Cardiovascular: Normal rate Pulmonary/chest: Effort normal Neurologic: Patient is alert Skin: Skin is warm Psychiatric: Patient has normal mood and affect    Ortho Exam: Ortho exam demonstrates full active and passive range of motion of the cervical spine.  She does have improved range of motion of the right shoulder with forward flexion and abduction both above 90 degrees.  Rotator cuff strength is good to infraspinatus supra and subscap muscle testing but it is a little bit painful with external rotation testing.  Mild coarseness with internal extra rotation of 90 degrees of abduction.  No AC joint tenderness to direct palpation.  Impingement signs positive on the right negative on the left.  Specialty Comments:  No specialty comments available.  Imaging: No results found.   PMFS History: Patient Active Problem List   Diagnosis Date Noted  . OSA (obstructive sleep apnea) 03/11/2019  . Hemorrhoids 08/21/2017   Past Medical History:  Diagnosis Date  . Hemorrhoids   . Hyperlipidemia   . Hypertension   . Pre-diabetes     Family History  Problem Relation Age of Onset  . Diabetes Mother   . Hypertension Mother   . Diabetes Father     Past Surgical History:  Procedure Laterality Date  . CESAREAN SECTION  3 previous  . COLONOSCOPY WITH PROPOFOL N/A 11/14/2017   Procedure: COLONOSCOPY WITH PROPOFOL;  Surgeon: [name], MD;  Location: [facility] ENDOSCOPY;  Service: Gastroenterology;  Laterality: N/A;  . COLONOSCOPY WITH PROPOFOL N/A 01/22/2018   Procedure: COLONOSCOPY WITH PROPOFOL;  Surgeon: [name], MD;  Location: [facility] ENDOSCOPY;  Service: Gastroenterology;  Laterality: N/A;  . TUBAL LIGATION     Social History   Occupational History  . Not on file  Tobacco Use  . Smoking status: Current Every Day Smoker    Types: Cigarettes  . Smokeless tobacco: Never Used  . Tobacco comment: 3 a day  Vaping Use  .  Vaping Use: Never used  Substance and Sexual Activity  . Alcohol use: No  . Drug use: No  . Sexual activity: Yes    Birth control/protection: Surgical

## 2020-05-20 ENCOUNTER — Other Ambulatory Visit (INDEPENDENT_AMBULATORY_CARE_PROVIDER_SITE_OTHER): Payer: Self-pay | Admitting: Primary Care

## 2020-05-20 ENCOUNTER — Other Ambulatory Visit: Payer: Self-pay | Admitting: Primary Care

## 2020-05-20 MED FILL — PRAVASTATIN NA 10 MG TAB: 10 | 30 days supply | Qty: 30 | Fill #0

## 2020-05-20 MED FILL — metFORMIN HCL ER 500 MG TB2: 500 | 30 days supply | Qty: 60 | Fill #0

## 2020-05-20 MED FILL — PANTOPRAZOLE SOD DR 40 MG T: 40 | 30 days supply | Qty: 30 | Fill #2

## 2020-05-20 MED FILL — LOSARTAN POTASSIUM 50 MG TA: 50 | 30 days supply | Qty: 30 | Fill #1

## 2020-05-20 MED FILL — CELECOXIB 200 MG CAP: 200 | 30 days supply | Qty: 60 | Fill #3

## 2020-05-20 MED FILL — TRIAMCINOLONE 0.1% CREAM: 0.1 | 7 days supply | Qty: 30 | Fill #0

## 2020-06-03 ENCOUNTER — Other Ambulatory Visit: Payer: Self-pay

## 2020-06-03 ENCOUNTER — Ambulatory Visit (INDEPENDENT_AMBULATORY_CARE_PROVIDER_SITE_OTHER): Payer: Self-pay | Admitting: Orthopedic Surgery

## 2020-06-03 DIAGNOSIS — M25511 Pain in right shoulder: Secondary | ICD-10-CM

## 2020-06-03 DIAGNOSIS — M7541 Impingement syndrome of right shoulder: Secondary | ICD-10-CM

## 2020-06-06 ENCOUNTER — Encounter: Payer: Self-pay | Admitting: Orthopedic Surgery

## 2020-06-06 NOTE — Progress Notes (Signed)
Office Visit Note   Patient: Frances Martin           Date of Birth: 09-10-1960           MRN: [id number] Visit Date: 06/03/2020 Requested by: Grayce Sessions, NP [address] [place],  [place] [address] PCP: Grayce Sessions, NP  Subjective: Chief Complaint  Patient presents with  . f/u arm pain    HPI: Frances Martin is a 59 y.o. female who presents to the office complaining of right shoulder pain.  Patient returns following right shoulder injection.  She is 4 weeks out from injection.  She had 70% relief from injection initially but now pain has returned in full force.  She localizes the majority of her pain to the lateral shoulder with some radiation into the bicep.  Occasional neck pain but this is mostly in the trapezius muscle.  She notes subjective weakness.  She is taking Celebrex with some relief.  Pain is waking her up at night on occasion.  She denies any numbness/tingling or radicular pain into the hand or wrist..                ROS: All systems reviewed are negative as they relate to the chief complaint within the history of present illness.  Patient denies fevers or chills.  Assessment & Plan: Visit Diagnoses:  1. Right shoulder pain, unspecified chronicity   2. Impingement syndrome of right shoulder     Plan: Patient is a 59 year old female presents complaining of right shoulder pain.  She had injection at her last office visit that provided 70% relief initially but now has worn off completely.  She has pain that wakes her up at night as well as subjective weakness throughout her daily life.  She has good range of motion of the shoulder on exam but she does have pain with resisted rotator cuff strength testing.  She also has positive impingement signs.  Ordered MRI arthrogram of the right shoulder for further evaluation due to failure of conservative treatment for longer than 6 weeks..  Follow-up after MRI to review results.  Patient agreed with plan.   Interpreter was utilized as patient's primary language is Spanish.  Follow-Up Instructions: No follow-ups on file.   Orders:  Orders Placed This Encounter  Procedures  . MR SHOULDER RIGHT W CONTRAST  . Arthrogram   No orders of the defined types were placed in this encounter.     Procedures: No procedures performed   Clinical Data: No additional findings.  Objective: Vital Signs: There were no vitals taken for this visit.  Physical Exam:  Constitutional: Patient appears well-developed HEENT:  Head: Normocephalic Eyes:EOM are normal Neck: Normal range of motion Cardiovascular: Normal rate Pulmonary/chest: Effort normal Neurologic: Patient is alert Skin: Skin is warm Psychiatric: Patient has normal mood and affect  Ortho Exam: Ortho exam demonstrates right shoulder with 85 degrees external rotation, 95 degrees abduction, 160 degrees forward flexion.  Her external rotation is increased compared with the contralateral side by about 10 to 15 degrees.  She has pain with supraspinatus and subscapularis resistance testing.  No weakness with rotator cuff strength testing.  Positive Neer and Hawkins impingement signs.  No tenderness to palpation over the axial cervical spine.  Negative Spurling sign.  Tenderness to palpation over the bicipital groove.  Positive O'Brien sign.  +5 motor strength of the bilateral grip strength, finger abduction, pronation/supination, bicep, tricep, deltoid.  Specialty Comments:  No specialty comments available.  Imaging:  No results found.   PMFS History: Patient Active Problem List   Diagnosis Date Noted  . OSA (obstructive sleep apnea) 03/11/2019  . Hemorrhoids 08/21/2017   Past Medical History:  Diagnosis Date  . Hemorrhoids   . Hyperlipidemia   . Hypertension   . Pre-diabetes     Family History  Problem Relation Age of Onset  . Diabetes Mother   . Hypertension Mother   . Diabetes Father     Past Surgical History:  Procedure  Laterality Date  . CESAREAN SECTION     3 previous  . COLONOSCOPY WITH PROPOFOL N/A 11/14/2017   Procedure: COLONOSCOPY WITH PROPOFOL;  Surgeon: [name], MD;  Location: [facility] ENDOSCOPY;  Service: Gastroenterology;  Laterality: N/A;  . COLONOSCOPY WITH PROPOFOL N/A 01/22/2018   Procedure: COLONOSCOPY WITH PROPOFOL;  Surgeon: [name], MD;  Location: [facility] ENDOSCOPY;  Service: Gastroenterology;  Laterality: N/A;  . TUBAL LIGATION     Social History   Occupational History  . Not on file  Tobacco Use  . Smoking status: Current Every Day Smoker    Types: Cigarettes  . Smokeless tobacco: Never Used  . Tobacco comment: 3 a day  Vaping Use  . Vaping Use: Never used  Substance and Sexual Activity  . Alcohol use: No  . Drug use: No  . Sexual activity: Yes    Birth control/protection: Surgical

## 2020-06-22 ENCOUNTER — Other Ambulatory Visit: Payer: Self-pay

## 2020-06-22 ENCOUNTER — Ambulatory Visit
Admission: RE | Admit: 2020-06-22 | Discharge: 2020-06-22 | Disposition: A | Discharge status: Home / Self Care | Payer: Self-pay | Source: Ambulatory Visit | Attending: Surgical | Admitting: Surgical

## 2020-06-22 DIAGNOSIS — M25511 Pain in right shoulder: Secondary | ICD-10-CM

## 2020-06-22 MED ORDER — IOPAMIDOL (ISOVUE-M 200) INJECTION 41%
15.0000 mL | Freq: Once | INTRAMUSCULAR | Status: AC
  Administered 2020-06-22: 15 mL via INTRA_ARTICULAR

## 2020-06-30 MED FILL — CELECOXIB 200 MG CAP: 200 | 30 days supply | Qty: 60 | Fill #4

## 2020-06-30 MED FILL — PRAVASTATIN NA 10 MG TAB: 10 | 30 days supply | Qty: 30 | Fill #1

## 2020-06-30 MED FILL — ?LOSARTAN POTASSIUM 50 MG T: 50 | 30 days supply | Qty: 30 | Fill #2

## 2020-06-30 MED FILL — metFORMIN HCL ER 500 MG TB2: 500 | 30 days supply | Qty: 60 | Fill #1

## 2020-07-31 MED FILL — PRAVASTATIN NA 10 MG TAB: 10 | 30 days supply | Qty: 30 | Fill #2

## 2020-07-31 MED FILL — LOSARTAN POTASSIUM 50 MG TA: 50 | 30 days supply | Qty: 30 | Fill #3

## 2020-07-31 MED FILL — metFORMIN HCL ER 500 MG TB2: 500 | 30 days supply | Qty: 60 | Fill #2

## 2020-07-31 MED FILL — CELECOXIB 200 MG CAP: 200 | 30 days supply | Qty: 60 | Fill #5

## 2020-09-15 MED FILL — PRAVASTATIN NA 10 MG TAB: 10 | 30 days supply | Qty: 30 | Fill #3

## 2020-09-15 MED FILL — CELECOXIB 200 MG CAP: 200 | 30 days supply | Qty: 60 | Fill #0

## 2020-09-15 MED FILL — metFORMIN HCL ER 500 MG TB2: 500 | 30 days supply | Qty: 60 | Fill #3

## 2020-10-09 ENCOUNTER — Telehealth: Payer: Self-pay | Admitting: Primary Care

## 2020-10-09 NOTE — Telephone Encounter (Signed)
Copied from CRM [id number]. Topic: General - Other >> Oct 08, 2020 11:29 AM Pawlus, Maxine Glenn A wrote: Reason for CRM: Pt wanted to speak with Mikle Bosworth, please CB.

## 2020-10-13 ENCOUNTER — Ambulatory Visit (INDEPENDENT_AMBULATORY_CARE_PROVIDER_SITE_OTHER): Payer: No Typology Code available for payment source | Admitting: Primary Care

## 2020-10-13 ENCOUNTER — Other Ambulatory Visit (INDEPENDENT_AMBULATORY_CARE_PROVIDER_SITE_OTHER): Payer: Self-pay | Admitting: Primary Care

## 2020-10-13 ENCOUNTER — Other Ambulatory Visit: Payer: Self-pay | Admitting: Primary Care

## 2020-10-13 ENCOUNTER — Encounter (INDEPENDENT_AMBULATORY_CARE_PROVIDER_SITE_OTHER): Payer: Self-pay | Admitting: Primary Care

## 2020-10-13 ENCOUNTER — Other Ambulatory Visit: Payer: Self-pay

## 2020-10-13 VITALS — BP 143/86 | HR 77 | Temp 97.5°F

## 2020-10-13 DIAGNOSIS — Z760 Encounter for issue of repeat prescription: Secondary | ICD-10-CM

## 2020-10-13 DIAGNOSIS — Z1211 Encounter for screening for malignant neoplasm of colon: Secondary | ICD-10-CM

## 2020-10-13 DIAGNOSIS — G8929 Other chronic pain: Secondary | ICD-10-CM

## 2020-10-13 DIAGNOSIS — E119 Type 2 diabetes mellitus without complications: Secondary | ICD-10-CM

## 2020-10-13 DIAGNOSIS — F32A Depression, unspecified: Secondary | ICD-10-CM

## 2020-10-13 DIAGNOSIS — I10 Essential (primary) hypertension: Secondary | ICD-10-CM

## 2020-10-13 DIAGNOSIS — M2550 Pain in unspecified joint: Secondary | ICD-10-CM

## 2020-10-13 LAB — POCT GLYCOSYLATED HEMOGLOBIN (HGB A1C): Hemoglobin A1C: 6.4 % — AB (ref 4.0–5.6)

## 2020-10-13 MED ORDER — CELECOXIB 200 MG PO CAPS: 200.0000 mg | ORAL_CAPSULE | Freq: Two times a day (BID) | ORAL | 1 refills | Status: DC

## 2020-10-13 MED ORDER — LOSARTAN POTASSIUM 50 MG PO TABS: 50.0000 mg | ORAL_TABLET | Freq: Every day | ORAL | 3 refills | Status: DC

## 2020-10-13 MED ORDER — FLUOXETINE HCL 10 MG PO TABS: 20.0000 mg | ORAL_TABLET | Freq: Every day | ORAL | 0 refills | Status: DC

## 2020-10-13 MED ORDER — METFORMIN HCL ER 500 MG PO TB24: 500.0000 mg | ORAL_TABLET | Freq: Two times a day (BID) | ORAL | 1 refills | Status: DC

## 2020-10-13 MED ORDER — ESCITALOPRAM OXALATE 10 MG PO TABS: 10.0000 mg | ORAL_TABLET | Freq: Every day | ORAL | 0 refills | Status: DC

## 2020-10-13 MED FILL — CELECOXIB 200 MG CAP: 200 | 90 days supply | Qty: 180 | Fill #0

## 2020-10-13 MED FILL — LOSARTAN POTASSIUM 50 MG TA: 50 | 30 days supply | Qty: 30 | Fill #0

## 2020-10-13 MED FILL — metFORMIN HCL ER 500 MG TB2: 500 | 30 days supply | Qty: 60 | Fill #4

## 2020-10-13 MED FILL — ESCITALOPRAM 10 MG TABLET: 10 | 30 days supply | Qty: 30 | Fill #0

## 2020-10-13 NOTE — Progress Notes (Signed)
Patient will not do labs due to orange card being expired

## 2020-10-13 NOTE — Patient Instructions (Signed)
Cmo controlar su hipertensin Managing Your Hypertension La hipertensin, tambin conocida como presin arterial alta, se produce cuando la sangre ejerce presin contra las paredes de las arterias con [place] fuerza. Las arterias son los vasos sanguneos que transportan la sangre desde el corazn hacia todas las partes del cuerpo. La hipertensin hace que el corazn haga ms esfuerzo para Herbalist y puede provocar que las arterias se Teacher, music o Advertising account executive. [place] lecturas de la presin arterial La presin arterial deseada puede variar en funcin de las enfermedades, la edad y otros factores personales. Una lectura de la presin arterial consta de un nmero ms alto sobre un nmero ms bajo. En condiciones ideales, la presin arterial debe estar por debajo de 120/80. Es importante que conozca lo siguiente:  El Teacher, English as a foreign language, o nmero superior, es la presin sistlica. Es la medida de la presin de las arterias cuando el corazn late.  El segundo nmero, o nmero inferior, es la presin diastlica. Es la medida de la presin en las arterias cuando el corazn se relaja. La presin arterial se clasifica en cuatro etapas. Sobre la base de la lectura de su presin arterial, el mdico puede usar las siguientes etapas para determinar si necesita tratamiento y de qu tipo. La presin sistlica y la presin diastlica se miden en una unidad llamada mmHg. Normales  Presin sistlica: por debajo de 123456.  Presin diastlica: por debajo de 80. Elevada  Presin sistlica: [id number].  Presin diastlica: por debajo de 80. Etapa 1 de hipertensin  Presin sistlica: 0000000.  Presin diastlica: [id number]. Etapa 2 de hipertensin  Presin sistlica: [id number] o ms.  Presin diastlica: 90 o ms. Cmo puede afectarme esta enfermedad? Controlar la hipertensin es una responsabilidad importante. Con el transcurso del [place], la hipertensin puede daar las arterias y Transport planner flujo de  sangre hacia partes importantes del cuerpo que incluyen el cerebro, el corazn y los riones. Tener hipertensin no tratada o no controlada puede causar:  Infarto de miocardio.  Accidente cerebrovascular.  Debilitamiento de los vasos sanguneos (aneurisma).  Insuficiencia cardaca.  Dao renal.  Dao ocular.  Sndrome metablico.  Problemas de memoria y concentracin.  Demencia vascular. Qu medidas puedo tomar para controlar esta afeccin? La hipertensin se puede controlar haciendo [employer] estilo de vida y, posiblemente, tomando medicamentos. Su mdico le ayudar a crear un plan para bajar la presin arterial al rango normal. Nutricin  Siga una dieta con alto contenido de fibras y [place], y con bajo contenido de sal (sodio), azcar agregada y grasas. Un ejemplo de plan alimenticio es la dieta sobre mtodos alimenticios para detener la hipertensin llamada DASH (Dietary Approaches to Stop Hypertension). Para alimentarse de esta manera: ? Coma mucha fruta y [place]. Trate de que la mitad del plato de cada comida sea de frutas y verduras. ? Coma cereales integrales, como pasta integral, arroz integral o pan integral. Llene aproximadamente un cuarto del plato con cereales integrales. ? Consuma productos lcteos descremados. ? Evite la ingesta de cortes de carne grasa, carne procesada o curada, y carne de ave con piel. Llene aproximadamente un cuarto del plato con protenas magras, como pescado, pollo sin piel, frijoles, huevos o tofu. ? Evite ingerir alimentos prehechos y procesados. En general, estos tienen mayor cantidad de sodio, azcar agregada y Wendee Copp.  Reduzca su ingesta diaria de sodio. La mayora de las personas que tienen hipertensin deben comer menos de 1500 mg de sodio por [employer].   Estilo de vida  Trabaje con  su mdico para Pharmacologist un peso saludable o perder [employer]. Pregntele cul es el peso recomendado para usted.  Realice al menos 30 minutos de ejercicio  que haga que se acelere su corazn (ejercicio Magazine features editor) la [employer] de la [place]. Estas actividades pueden incluir caminar, nadar o andar en bicicleta.  Incluya ejercicios para fortalecer sus msculos (ejercicios de resistencia), como levantamiento de pesas, como parte de su rutina semanal de ejercicios. Intente realizar de este tipo de ejercicios al Kellogg a la [place].  No consuma ningn producto que contenga nicotina o tabaco, como cigarrillos, cigarrillos electrnicos y tabaco de Theatre manager. Si necesita ayuda para dejar de fumar, consulte al [employer].  Controle las enfermedades a largo plazo (crnicas), como el colesterol alto o la diabetes.  Identifique sus causas de estrs y encuentre maneras de Charity fundraiser. Esto puede incluir meditacin, respiracin profunda o hacerse tiempo para Arts development officer divertidas.   Consumo de alcohol  No beba alcohol si: ? Su mdico le indica no hacerlo. ? Est embarazada, puede estar embarazada o est tratando de quedar embarazada.  Si bebe alcohol: ? Limite la cantidad que bebe:  De 0 a 1 medida por da para las mujeres.  De 0 a 2 medidas por da para los hombres. ? Est atento a la cantidad de alcohol que hay en las bebidas que toma. En los [place], una medida equivale a una botella de cerveza de 12oz ( ), un vaso de vino de 5oz ( ) o un vaso de una bebida alcohlica de alta graduacin de 1oz (40ml). Medicamentos El mdico puede recetarle medicamentos si los cambios en el estilo de vida no son suficientes para Museum/gallery curator la presin arterial y si:  Su presin arterial sistlica es de 130 o ms.  Su presin arterial diastlica es de 80 o ms. Use los medicamentos solamente como se lo haya indicado el mdico. Siga cuidadosamente las indicaciones. Los medicamentos para la presin arterial deben tomarse como se lo haya indicado el mdico. Los medicamentos pierden eficacia al omitir las dosis.  El hecho de omitir las dosis tambin [place] el riesgo de otros problemas. Monitoreo Antes de [facility] presin arterial:  No fume, no consuma bebidas con cafena ni haga ejercicio dentro de los 30 minutos antes de tomar la medicin.  Vaya al bao y vace la vejiga (orine).  Permanezca sentado tranquilamente durante al menos 5 minutos antes de tomar las mediciones. Contrlese la presin arterial en su casa segn las indicaciones del mdico. Para hacer esto:  Sintese con la espalda recta y con apoyo.  Coloque los pies planos en el piso. No se cruce de piernas.  Apoye el brazo sobre una superficie plana, como una mesa. Asegrese de que la parte superior del brazo est al nivel del corazn.  Cada vez que tome una medicin, tome dos o tres lecturas con un minuto de separacin y [employer]. Posiblemente tambin sea necesario que el mdico le controle la presin arterial de [place] regular.   Informacin general  Hable con su mdico acerca de la dieta, hbitos de ejercicio y otros factores del estilo de vida que pueden contribuir a la hipertensin.  Revise con su mdico todos los medicamentos que toma ya que puede haber efectos secundarios o interacciones.  Concurra a todas las visitas tal como se lo haya indicado el mdico. El mdico puede ayudarle a crear y Dawayne Patricia su plan para controlar la presin arterial alta. Dnde buscar ms informacin  National Heart,  Lung, and Blood Institute (Instituto Nacional del Corazn, los Pulmones y la Sangre): www.nhlbi.nih.gov  American Heart Association (Asociacin Estadounidense del Corazn): www.heart.org Comunquese con un mdico si:  Piensa que tiene una reaccin alrgica a los medicamentos que ha tomado.  Tiene dolores de cabeza frecuentes (recurrentes).  Siente mareos.  Tiene hinchazn en los tobillos.  Tiene problemas de visin. Solicite ayuda de inmediato si:  Siente un dolor de cabeza intenso o confusin.  Siente  debilidad inusual, adormecimiento o que se desmayar.  Siente un dolor intenso en el pecho o el abdomen.  Vomita repetidas veces.  Tiene dificultad para respirar. Estos sntomas pueden representar un problema grave que constituye una emergencia. No espere a ver si los sntomas desaparecen. Solicite atencin mdica de inmediato. Comunquese con el servicio de emergencias de su localidad (911 en los [place]). No conduzca por sus propios medios hasta el hospital. Resumen  La hipertensin se produce cuando la sangre bombea en las arterias con mucha fuerza. Si esta afeccin no se controla, podra correr riesgo de tener complicaciones graves.  La presin arterial deseada puede variar en funcin de las enfermedades, la edad y otros factores personales. Para la mayora de las personas, una presin arterial normal es menor que 120/80.  La hipertensin se puede controlar mediante cambios en el estilo de vida, tomando medicamentos, o ambas cosas.  Los cambios en el estilo de vida para ayudar a controlar la hipertensin incluyen prdida de peso, seguir una dieta saludable, con bajo contenido de sodio, hacer ms ejercicio, dejar de fumar y limitar el consumo de alcohol. Esta informacin no tiene como fin reemplazar el consejo del mdico. Asegrese de hacerle al mdico cualquier pregunta que tenga. Document Revised: 09/06/2019 Document Reviewed: 09/06/2019 Elsevier Patient Education  2021 Elsevier Inc.  

## 2020-10-13 NOTE — Progress Notes (Addendum)
Subjective:  [name], female    DOB: 17-Sep-1960  Age: 60 y.o. 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  Diabetes Patient does not check blood sugar at home Compliant with meds - Yes Checking CBGs? Yes  Fasting avg - Exercising regularly? - yes states goes to the gym 3 times a week.  Watching carbohydrate intake? - Yes Neuropathy ? - Yes, Hypoglycemic events - No - Recovers with :  Pertinent ROS:  Polyuria - No Polydipsia - No Vision problems - No Hypertension Denies shortness of breath, headaches, chest pain or lower extremity edema Medications as noted below. Taking them regularly without complication/adverse reaction being reported today.   History Ismahan has a past medical history of Hemorrhoids, Hyperlipidemia, Hypertension, and Pre-diabetes.   She has a past surgical history that includes Tubal ligation; Colonoscopy with propofol (N/A, 11/14/2017); Colonoscopy with propofol (N/A, 01/22/2018); and Cesarean section.   Her family history includes Diabetes in her father and mother; Hypertension in her mother.She reports that she has been smoking cigarettes. She has never used smokeless tobacco. She reports that she does not drink alcohol and does not use drugs.  Current Outpatient Medications on File Prior to Visit  Medication Sig Dispense Refill  . aspirin EC 81 MG tablet Take 81 mg by mouth daily.    . celecoxib (CELEBREX) 200 MG capsule Take 1 capsule (200 mg total) by mouth 2 (two) times daily. 180 capsule 1  . pantoprazole (PROTONIX) 40 MG tablet Take 1 tablet (40 mg total) by mouth daily. 30 tablet 2  . pravastatin (PRAVACHOL) 10 MG tablet Take 1 tablet (10 mg total) by mouth daily. 90 tablet 1  . triamcinolone cream (KENALOG) 0.1 % APPLY 1 APPLICATION TOPICALLY 2 (TWO) TIMES DAILY. 30 g 0   No current  facility-administered medications on file prior to visit.    ROS Review of Systems Pertinent positive and negatives in HPI  Objective:  BP (!) 143/86 (BP Location: Right Arm, Patient Position: Sitting, Cuff Size: Normal)   Pulse 77   Temp (!) 97.5 F (36.4 C) (Temporal)   SpO2 93%   BP Readings from Last 3 Encounters:  10/13/20 (!) 143/86  04/15/20 136/82  03/31/20 134/89    Wt Readings from Last 3 Encounters:  05/04/20 184 lb (83.5 kg)  04/15/20 184 lb 12.8 oz (83.8 kg)  03/31/20 186 lb 9.6 oz (84.6 kg)    Physical Exam Vitals reviewed.  Constitutional:      Appearance: Normal appearance.  HENT:     Head: Normocephalic.     Right Ear: External ear normal.     Left Ear: External ear normal.     Nose: Nose normal.  Eyes:     Extraocular Movements: Extraocular movements intact.  Cardiovascular:     Rate and Rhythm: Normal rate and regular rhythm.  Pulmonary:     Effort: Pulmonary effort is normal.     Breath sounds: Normal breath sounds.  Abdominal:     General: Bowel sounds are normal. There is distension.     Palpations: Abdomen is soft.  Musculoskeletal:        General: Normal range of motion.     Cervical back: Normal range of motion and neck supple.  Skin:    General: Skin is warm and dry.  Neurological:     Mental Status: She  is alert and oriented to person, place, and time.  Psychiatric:        Mood and Affect: Mood normal.        Behavior: Behavior normal.        Thought Content: Thought content normal.        Judgment: Judgment normal.     Lab Results  Component Value Date   HGBA1C 6.4 (A) 10/13/2020   HGBA1C 6.4 (A) 03/31/2020   HGBA1C 6.4 (A) 02/11/2020    Lab Results  Component Value Date   WBC 8.3 11/13/2019   HGB 13.3 11/13/2019   HCT 40.6 11/13/2019   PLT 330 11/13/2019   GLUCOSE 106 (H) 11/13/2019   CHOL 169 11/13/2019   TRIG 128 11/13/2019   HDL 55 11/13/2019   LDLCALC 91 11/13/2019   ALT 19 11/13/2019   AST 12 11/13/2019    NA 143 11/13/2019   K 4.5 11/13/2019   CL 108 (H) 11/13/2019   CREATININE 0.77 11/13/2019   BUN 12 11/13/2019   CO2 22 11/13/2019   TSH 2.200 04/14/2017   HGBA1C 6.4 (A) 10/13/2020     Assessment & Plan:   Shakeyla was seen today for diabetes and hypertension.  Diagnoses and all orders for this visit: Frances Martin was seen today for diabetes and hypertension.  Diagnoses and all orders for this visit: Frances Martin was seen today for diabetes and hypertension.  Diagnoses and all orders for this visit:  Type 2 diabetes mellitus without complication, without long-term current use of insulin (Center) : Goal of therapy:  Is met Less than 6.5 hemoglobin A1c.  Reference clinical practice recommendations. Foods that are high in carbohydrates are the following rice, potatoes, breads, sugars, and pastas.  Reduction in the intake (eating) will assist in lowering your blood sugars. -     HgB A1c 6.2  -     metFORMIN (GLUCOPHAGE-XR) 500 MG 24 hr tablet; Take 1 tablet (500 mg total) by mouth 2 (two) times daily after a meal.  Essential hypertension Elevated today mention she is getting frustrated because she is forgetting thing. Counseled on blood pressure goal of less than 130/80, low-sodium, DASH diet, medication compliance, 150 minutes of moderate intensity exercise per week. Discussed medication compliance, adverse effects. -     losartan (COZAAR) 50 MG tablet; Take 1 tablet (50 mg total) by mouth daily.  Colon cancer screening -     Ambulatory referral to Gastroenterology  Depression, unspecified depression type/GAD Stop taking Prozac made her feel sick . Discontinue Prozac change to Lexapro 10 mg   Medication refill -     metFORMIN (GLUCOPHAGE-XR) 500 MG 24 hr tablet; Take 1 tablet (500 mg total) by mouth 2 (two) times daily after a meal. -     losartan (COZAAR) 50 MG tablet; Take 1 tablet (50 mg total) by mouth daily.     I have discontinued Frances Martin's FLUoxetine, losartan,  losartan, and FLUoxetine. I am also having her start on losartan and escitalopram. Additionally, I am having her maintain her aspirin EC, pantoprazole, celecoxib, pravastatin, triamcinolone, and metFORMIN.  Meds ordered this encounter  Medications  . metFORMIN (GLUCOPHAGE-XR) 500 MG 24 hr tablet    Sig: Take 1 tablet (500 mg total) by mouth 2 (two) times daily after a meal.    Dispense:  180 tablet    Refill:  1  . DISCONTD: losartan (COZAAR) 50 MG tablet    Sig: Take 1 tablet (50 mg total) by mouth daily.    Dispense:  90 tablet    Refill:  3  . DISCONTD: FLUoxetine (PROZAC) 10 MG tablet    Sig: Take 2 tablets (20 mg total) by mouth daily.    Dispense:  90 tablet    Refill:  0  . losartan (COZAAR) 50 MG tablet    Sig: Take 1 tablet (50 mg total) by mouth daily.    Dispense:  90 tablet    Refill:  3  . escitalopram (LEXAPRO) 10 MG tablet    Sig: Take 1 tablet (10 mg total) by mouth daily.    Dispense:  90 tablet    Refill:  0     Follow-up:   Return in about 6 weeks (around 11/24/2020) for Medication f/u .  The above assessment and management plan was discussed with the patient. The patient verbalized understanding of and has agreed to the management plan. Patient is aware to call the clinic if symptoms fail to improve or worsen. Patient is aware when to return to the clinic for a follow-up visit. Patient educated on when it is appropriate to go to the emergency department.   Juluis Mire, NP-C

## 2020-10-14 ENCOUNTER — Ambulatory Visit: Payer: Self-pay | Attending: Family Medicine

## 2020-10-14 ENCOUNTER — Ambulatory Visit: Payer: No Typology Code available for payment source

## 2020-10-14 ENCOUNTER — Other Ambulatory Visit: Payer: Self-pay

## 2020-10-27 ENCOUNTER — Other Ambulatory Visit: Payer: Self-pay | Admitting: Dentistry

## 2020-11-23 ENCOUNTER — Other Ambulatory Visit: Payer: Self-pay

## 2020-11-23 MED FILL — Metformin HCl Tab ER 24HR 500 MG: ORAL | 30 days supply | Qty: 60 | Fill #0 | Status: AC

## 2020-11-23 MED FILL — Losartan Potassium Tab 50 MG: ORAL | 30 days supply | Qty: 30 | Fill #0 | Status: AC

## 2020-11-23 MED FILL — Losartan Potassium Tab 50 MG: ORAL | 30 days supply | Qty: 30 | Fill #0 | Status: CN

## 2020-11-24 ENCOUNTER — Other Ambulatory Visit: Payer: Self-pay | Admitting: Primary Care

## 2020-11-24 ENCOUNTER — Other Ambulatory Visit: Payer: Self-pay

## 2020-11-24 DIAGNOSIS — Z1231 Encounter for screening mammogram for malignant neoplasm of breast: Secondary | ICD-10-CM

## 2020-11-25 ENCOUNTER — Ambulatory Visit (INDEPENDENT_AMBULATORY_CARE_PROVIDER_SITE_OTHER): Payer: No Typology Code available for payment source | Admitting: Orthopedic Surgery

## 2020-11-25 DIAGNOSIS — M7551 Bursitis of right shoulder: Secondary | ICD-10-CM

## 2020-11-25 DIAGNOSIS — M25511 Pain in right shoulder: Secondary | ICD-10-CM

## 2020-11-28 ENCOUNTER — Encounter: Payer: Self-pay | Admitting: Orthopedic Surgery

## 2020-11-28 MED ORDER — BUPIVACAINE HCL 0.5 % IJ SOLN
9.0000 mL | INTRAMUSCULAR | Status: AC | PRN
  Administered 2020-11-25: 9 mL via INTRA_ARTICULAR

## 2020-11-28 MED ORDER — LIDOCAINE HCL 1 % IJ SOLN
5.0000 mL | INTRAMUSCULAR | Status: AC | PRN
  Administered 2020-11-25: 5 mL

## 2020-11-28 MED ORDER — BETAMETHASONE SOD PHOS & ACET 6 (3-3) MG/ML IJ SUSP
6.0000 mg | INTRAMUSCULAR | Status: AC | PRN
  Administered 2020-11-25: 6 mg via INTRA_ARTICULAR

## 2020-11-28 NOTE — Progress Notes (Signed)
Office Visit Note   Patient: Frances Martin           Date of Birth: 05-16-1961           MRN: [id number] Visit Date: 11/25/2020 Requested by: Grayce Sessions, NP [address] [place],  [place] [address] PCP: Grayce Sessions, NP  Subjective: Chief Complaint  Patient presents with  . Other     Scan review    HPI: Patient presents for follow-up of right shoulder MRI.  Hard for her to sleep on the right-hand side.  Localizes her pain primarily to the deltoid region and its very focal.  Denies any mechanical symptoms.  She had very good relief from a subacromial injection for about 4 months.  80% reduction in pain.  Since she was last seen she has had an MRI scan which shows tendinosis as well as partial-thickness anterior supraspinatus tendon tearing with possible biceps pathology which may or may not be real based on my interpretation of the scan.  She also has posterior labral fraying.  Moderate AC joint arthritis.              ROS: All systems reviewed are negative as they relate to the chief complaint within the history of present illness.  Patient denies  fevers or chills.   Assessment & Plan: Visit Diagnoses:  1. Right shoulder pain, unspecified chronicity     Plan: Impression is right shoulder bursitis and tendinitis.  Good relief with prior subacromial injection.  That injection was repeated today and we will start her in physical therapy upstairs for range of motion rotator cuff strengthening.  No operative indication at this time.  No evidence of frozen shoulder either clinically or on MRI scan.  Follow-up as needed.  Continue with anti-inflammatories as needed.  Follow-Up Instructions: Return if symptoms worsen or fail to improve.   Orders:  Orders Placed This Encounter  Procedures  . Ambulatory referral to Physical Therapy   No orders of the defined types were placed in this encounter.     Procedures: Large Joint Inj: R subacromial bursa on  11/25/2020 9:00 AM Indications: diagnostic evaluation and pain Details: 18 G 1.5 in needle, posterior approach  Arthrogram: No  Medications: 9 mL bupivacaine 0.5 %; 5 mL lidocaine 1 %; 6 mg betamethasone acetate-betamethasone sodium phosphate 6 (3-3) MG/ML Outcome: tolerated well, no immediate complications Procedure, treatment alternatives, risks and benefits explained, specific risks discussed. Consent was given by the patient. Immediately prior to procedure a time out was called to verify the correct patient, procedure, equipment, support staff and site/side marked as required. Patient was prepped and draped in the usual sterile fashion.       Clinical Data: No additional findings.  Objective: Vital Signs: There were no vitals taken for this visit.  Physical Exam:   Constitutional: Patient appears well-developed HEENT:  Head: Normocephalic Eyes:EOM are normal Neck: Normal range of motion Cardiovascular: Normal rate Pulmonary/chest: Effort normal Neurologic: Patient is alert Skin: Skin is warm Psychiatric: Patient has normal mood and affect    Ortho Exam: Ortho exam demonstrates symmetric external rotation of 15 degrees of abduction.  Impingement signs mildly positive on the right negative on the left.  No masses lymphadenopathy or skin changes noted in the right shoulder region.  Rotator cuff strength is excellent demonstrated stiffness subscap muscle testing.  Negative O'Brien's testing on the right negative apprehension relocation testing.  No coarse grinding or crepitus with active or passive range of motion of  the right shoulder.  Specialty Comments:  No specialty comments available.  Imaging: No results found.   PMFS History: Patient Active Problem List   Diagnosis Date Noted  . OSA (obstructive sleep apnea) 03/11/2019  . Hemorrhoids 08/21/2017   Past Medical History:  Diagnosis Date  . Hemorrhoids   . Hyperlipidemia   . Hypertension   . Pre-diabetes      Family History  Problem Relation Age of Onset  . Diabetes Mother   . Hypertension Mother   . Diabetes Father     Past Surgical History:  Procedure Laterality Date  . CESAREAN SECTION     3 previous  . COLONOSCOPY WITH PROPOFOL N/A 11/14/2017   Procedure: COLONOSCOPY WITH PROPOFOL;  Surgeon: [name], MD;  Location: [facility] ENDOSCOPY;  Service: Gastroenterology;  Laterality: N/A;  . COLONOSCOPY WITH PROPOFOL N/A 01/22/2018   Procedure: COLONOSCOPY WITH PROPOFOL;  Surgeon: [name], MD;  Location[facility] ENDOSCOPY;  Service: Gastroenterology;  Laterality: N/A;  . TUBAL LIGATION     Social History   Occupational History  . Not on file  Tobacco Use  . Smoking status: Current Every Day Smoker    Types: Cigarettes  . Smokeless tobacco: Never Used  . Tobacco comment: 3 a day  Vaping Use  . Vaping Use: Never used  Substance and Sexual Activity  . Alcohol use: No  . Drug use: No  . Sexual activity: Yes    Birth control/protection: Surgical

## 2020-11-30 ENCOUNTER — Ambulatory Visit (INDEPENDENT_AMBULATORY_CARE_PROVIDER_SITE_OTHER): Payer: No Typology Code available for payment source | Admitting: Primary Care

## 2020-12-10 ENCOUNTER — Encounter: Payer: Self-pay | Admitting: Rehabilitative and Restorative Service Providers"

## 2020-12-10 ENCOUNTER — Other Ambulatory Visit: Payer: Self-pay

## 2020-12-10 ENCOUNTER — Ambulatory Visit (INDEPENDENT_AMBULATORY_CARE_PROVIDER_SITE_OTHER): Payer: Self-pay | Admitting: Rehabilitative and Restorative Service Providers"

## 2020-12-10 DIAGNOSIS — M25511 Pain in right shoulder: Secondary | ICD-10-CM

## 2020-12-10 DIAGNOSIS — G8929 Other chronic pain: Secondary | ICD-10-CM

## 2020-12-10 DIAGNOSIS — R600 Localized edema: Secondary | ICD-10-CM

## 2020-12-10 DIAGNOSIS — M25611 Stiffness of right shoulder, not elsewhere classified: Secondary | ICD-10-CM

## 2020-12-10 DIAGNOSIS — M6281 Muscle weakness (generalized): Secondary | ICD-10-CM

## 2020-12-10 NOTE — Patient Instructions (Signed)
Access Code: [email or web address] URL: [email or web address] Date: 12/10/2020 Prepared by: Pauletta Browns  Exercises Standing Scapular Retraction - 5 x daily - 7 x weekly - 1 sets - 5 reps - 5 second hold Supine Scapular Protraction in Flexion with Dumbbells - 2-3 x daily - 7 x weekly - 1 sets - 20 reps - 3 seconds hold Shoulder External Rotation with Anchored Resistance with Towel Under Elbow - 1 x daily - 7 x weekly - 2-3 sets - 10 reps - 3 hold

## 2020-12-10 NOTE — Therapy (Signed)
[facility] Physical Therapy [address] [place], [place], [address] Phone: [phone]   Fax:  [phone]  Physical Therapy Evaluation  Patient Details  Name: Frances Martin MRN: [id number] Date of Birth: 1960-11-27 Referring Provider (PT): Julieanne Cotton PA-C   Encounter Date: 12/10/2020   PT End of Session - 12/10/20 1718    Visit Number 1    Number of Visits 12    PT Start Time 0801    PT Stop Time 0844    PT Time Calculation (min) 43 min    Activity Tolerance Patient tolerated treatment well;Patient limited by pain    Behavior During Therapy [facility] for tasks assessed/performed           Past Medical History:  Diagnosis Date  . Hemorrhoids   . Hyperlipidemia   . Hypertension   . Pre-diabetes     Past Surgical History:  Procedure Laterality Date  . CESAREAN SECTION     3 previous  . COLONOSCOPY WITH PROPOFOL N/A 11/14/2017   Procedure: COLONOSCOPY WITH PROPOFOL;  Surgeon: [name], MD;  Location: [facility] ENDOSCOPY;  Service: Gastroenterology;  Laterality: N/A;  . COLONOSCOPY WITH PROPOFOL N/A 01/22/2018   Procedure: COLONOSCOPY WITH PROPOFOL;  Surgeon: [name], MD;  Location: [facility] ENDOSCOPY;  Service: Gastroenterology;  Laterality: N/A;  . TUBAL LIGATION      There were no vitals filed for this visit.    Subjective Assessment - 12/10/20 1709    Subjective Frances Martin hurt her R shoulder when trying to keep her granddaughter from hurting herself while skating.  MRI shows partial supraspinatus and biceps tendon tears.  She would like to avoid surgery while still participating in a very active lifestyle (gym, skating, biking).    Patient is accompained by: Interpreter    Pertinent History Pre-diabetes, smoker    Limitations Lifting    Diagnostic tests MRI    Patient Stated Goals Avoid surgery and improve pain    Currently in Pain? Yes    Pain Score 4     Pain Location Shoulder    Pain Orientation Right    Pain Descriptors /  Indicators Aching;Burning;Jabbing;Dull;Sore;Tender;Tightness    Pain Type Chronic pain    Pain Radiating Towards R elbow    Pain Onset More than a month ago    Pain Frequency Constant    Aggravating Factors  Reaching (particularly overhead or behind the back), sleeping on the R side and lifting anything with the R shoulder    Pain Relieving Factors Injection and rest    Effect of Pain on Daily Activities Unable to use R shoulder normally or sleep on the R side    Multiple Pain Sites No              [facility] PT Assessment - 12/10/20 0001      Assessment   Medical Diagnosis R shoulder partial RTC tear    Referring Provider (PT) Joycie Peek Magnant PA-C    Onset Date/Surgical Date --   Chronic   Hand Dominance Right      Balance Screen   Has the patient fallen in the past 6 months No    Has the patient had a decrease in activity level because of a fear of falling?  No    Is the patient reluctant to leave their home because of a fear of falling?  No      Prior Function   Level of Independence Independent    Vocation Works at home    Leisure  Gym, Biking, Skating      Cognition   Overall Cognitive Status Within Functional Limits for tasks assessed      Observation/Other Assessments   Focus on Therapeutic Outcomes (FOTO)  42 (Goal 62)      ROM / Strength   AROM / PROM / Strength AROM;Strength      AROM   Overall AROM  Deficits    AROM Assessment Site Shoulder    Right/Left Shoulder Left;Right    Right Shoulder Flexion 150 Degrees    Right Shoulder Internal Rotation 35 Degrees    Right Shoulder External Rotation 100 Degrees    Right Shoulder Horizontal  ADduction 20 Degrees    Left Shoulder Flexion 160 Degrees    Left Shoulder Internal Rotation 70 Degrees    Left Shoulder External Rotation 85 Degrees    Left Shoulder Horizontal ADduction 35 Degrees      Strength   Overall Strength Deficits    Strength Assessment Site Shoulder    Right/Left Shoulder Left;Right    Right  Shoulder Internal Rotation --   15 pounds   Right Shoulder External Rotation --   9 pounds   Left Shoulder Internal Rotation --   30 pounds   Left Shoulder External Rotation --   24 pounds                     Objective measurements completed on examination: See above findings.       [facility] Adult PT Treatment/Exercise - 12/10/20 0001      Therapeutic Activites    Therapeutic Activities Other Therapeutic Activities    Other Therapeutic Activities Reviewed exam findings, detailed shoulder anatomy related to her MRI and non-surgical vs surgical approach and expectations      Exercises   Exercises Shoulder      Shoulder Exercises: Supine   Protraction Strengthening;Right;20 reps;Limitations    Protraction Limitations 3 seconds (supine arm raises with extra reach at top, palm in)      Shoulder Exercises: Seated   Retraction Strengthening;Both;10 reps;Limitations    Retraction Limitations 5 seconds (shoulder blade pinches)      Shoulder Exercises: Standing   External Rotation Strengthening;Right;10 reps;Theraband    Theraband Level (Shoulder External Rotation) Level 2 (Red)    External Rotation Limitations slow eccentrics full range                  PT Education - 12/10/20 1716    Education Details Reviewed exam findings, MRI and surgical vs non-surgical approach to treatment.  Reviewed starter HEP.    Person(s) Educated Patient    Methods Explanation;Demonstration;Tactile cues;Verbal cues;Handout    Comprehension Verbal cues required;Need further instruction;Returned demonstration;Verbalized understanding;Tactile cues required            PT Short Term Goals - 12/10/20 1723      PT SHORT TERM GOAL #1   Title Yan will be independent with her starter HEP.    Time 4    Period Weeks    Status New    Target Date 01/07/21             PT Long Term Goals - 12/10/20 1724      PT LONG TERM GOAL #1   Title Improve FOTO to 62.    Baseline 42    Time  6    Period Weeks    Status New    Target Date 01/21/21      PT LONG TERM GOAL #2  Title Decrease R shoulder pain to consistently 0-3/10 on the Numeric Pain Rating Scale.    Baseline Can be 5+/10    Time 6    Period Weeks    Status New    Target Date 01/21/21      PT LONG TERM GOAL #3   Title Improve R shoulder AROM for flexion to 170; IR to 60 and HA to 40 degrees.    Baseline 150; 35; 20    Time 6    Period Weeks    Status New    Target Date 01/21/21      PT LONG TERM GOAL #4   Title Improve R shoulder strength for IR and ER.    Baseline 9 pounds ER and 15 pounds IR.    Time 6    Period Weeks    Status New    Target Date 01/21/21      PT LONG TERM GOAL #5   Title Bettyjane will be independent with her long-term HEP at DC.    Time 6    Period Weeks    Status New    Target Date 01/21/21                  Plan - 12/10/20 1719    Clinical Impression Statement Adryan has chronic R shoulder pain, a R RTC tear and a R biceps partial tear.  She would like to avoid surgery.  Posterior capsule tightness (limited IR and HA AROM), scapular and rotator cuff weakness will be addressed.  Her prognosis to move better, be stronger and improve function is good with continued work.    Personal Factors and Comorbidities Comorbidity 2    Comorbidities Pre-diabetes and smoker    Examination-Activity Limitations Dressing;Sleep;Lift;Reach Overhead;Carry    Examination-Participation Restrictions Cleaning;Community Activity;Driving    Stability/Clinical Decision Making Stable/Uncomplicated    Clinical Decision Making Low    Rehab Potential Good    PT Frequency 2x / week    PT Duration 6 weeks    PT Treatment/Interventions ADLs/Self Care Home Management;Cryotherapy;Therapeutic activities;Therapeutic exercise;Neuromuscular re-education;Patient/family education;Manual techniques;Passive range of motion    PT Next Visit Plan IR stretching, scapular and RTC strengthening    PT Home  Exercise Plan Access Code: [email or web address]    Consulted and Agree with Plan of Care Patient           Patient will benefit from skilled therapeutic intervention in order to improve the following deficits and impairments:  Decreased activity tolerance,Decreased endurance,Decreased range of motion,Decreased strength,Increased edema,Impaired flexibility,Impaired UE functional use,Pain  Visit Diagnosis: Localized edema  Muscle weakness (generalized)  Stiffness of right shoulder, not elsewhere classified  Chronic right shoulder pain     Problem List Patient Active Problem List   Diagnosis Date Noted  . OSA (obstructive sleep apnea) 03/11/2019  . Hemorrhoids 08/21/2017    Cherlyn Cushing PT, MPT 12/10/2020, 5:29 PM  [facility] Physical Therapy [address] [place], [place], [address] Phone: [phone]   Fax:  [phone]  Name: Frances Martin MRN: [id number] Date of Birth: [birth year]

## 2020-12-16 ENCOUNTER — Ambulatory Visit (INDEPENDENT_AMBULATORY_CARE_PROVIDER_SITE_OTHER): Payer: Self-pay | Admitting: Physical Therapy

## 2020-12-16 ENCOUNTER — Other Ambulatory Visit: Payer: Self-pay

## 2020-12-16 ENCOUNTER — Encounter: Payer: Self-pay | Admitting: Physical Therapy

## 2020-12-16 DIAGNOSIS — M25611 Stiffness of right shoulder, not elsewhere classified: Secondary | ICD-10-CM

## 2020-12-16 DIAGNOSIS — M6281 Muscle weakness (generalized): Secondary | ICD-10-CM

## 2020-12-16 DIAGNOSIS — G8929 Other chronic pain: Secondary | ICD-10-CM

## 2020-12-16 DIAGNOSIS — R600 Localized edema: Secondary | ICD-10-CM

## 2020-12-16 DIAGNOSIS — M25511 Pain in right shoulder: Secondary | ICD-10-CM

## 2020-12-16 NOTE — Therapy (Signed)
[facility] Physical Therapy [address] [place], [place], [address] Phone: [phone]   Fax:  [phone]  Physical Therapy Treatment  Patient Details  Name: Frances Martin MRN: [id number] Date of Birth: 08/14/1961 Referring Provider (PT): Donella Stade PA-C   Encounter Date: 12/16/2020   PT End of Session - 12/16/20 1149    Visit Number 2    Number of Visits 12    PT Start Time 2620    PT Stop Time 1228    PT Time Calculation (min) 39 min    Activity Tolerance Patient tolerated treatment well;Patient limited by pain    Behavior During Therapy [facility] for tasks assessed/performed           Past Medical History:  Diagnosis Date  . Hemorrhoids   . Hyperlipidemia   . Hypertension   . Pre-diabetes     Past Surgical History:  Procedure Laterality Date  . CESAREAN SECTION     3 previous  . COLONOSCOPY WITH PROPOFOL N/A 11/14/2017   Procedure: COLONOSCOPY WITH PROPOFOL;  Surgeon: Jonathon Bellows, MD;  Location: [facility] ENDOSCOPY;  Service: Gastroenterology;  Laterality: N/A;  . COLONOSCOPY WITH PROPOFOL N/A 01/22/2018   Procedure: COLONOSCOPY WITH PROPOFOL;  Surgeon: Jonathon Bellows, MD;  Location: [facility] ENDOSCOPY;  Service: Gastroenterology;  Laterality: N/A;  . TUBAL LIGATION      There were no vitals filed for this visit.   Subjective Assessment - 12/16/20 1150    Subjective Pt reports she has been doing her HEP, has developed Rt elbow pain yesterday, shoulder is not hurting today    Patient is accompained by: Interpreter   [place] [phone]   Patient Stated Goals Avoid surgery and improve pain    Currently in Pain? Yes    Pain Score 7     Pain Location Elbow    Pain Orientation Right    Pain Descriptors / Indicators Discomfort    Pain Type Acute pain                             [facility] Adult PT Treatment/Exercise - 12/16/20 0001      Shoulder Exercises: Seated   Row Both;10 reps;Theraband    Theraband Level (Shoulder Row) Level 2  (Red)    External Rotation Strengthening;Both;20 reps;Theraband    Theraband Level (Shoulder External Rotation) Level 2 (Red)    Other Seated Exercises 10 reps Rt UE ulnar nerve flossing      Shoulder Exercises: Standing   Flexion Right;10 reps   wall slides   Other Standing Exercises 15 reps scapular retraction with hands behind head      Shoulder Exercises: Stretch   Other Shoulder Stretches low and mid doorway stretch Rt UE 20sec, pt reports a lot of pain in deltoid with mid      Manual Therapy   Manual Therapy Soft tissue mobilization    Soft tissue mobilization IASTM to Rt deltoid                    PT Short Term Goals - 12/10/20 1723      PT SHORT TERM GOAL #1   Title Dalesha will be independent with her starter HEP.    Time 4    Period Weeks    Status New    Target Date 01/07/21             PT Long Term Goals - 12/10/20 1724      PT  LONG TERM GOAL #1   Title Improve FOTO to 36.    Baseline 42    Time 6    Period Weeks    Status New    Target Date 01/21/21      PT LONG TERM GOAL #2   Title Decrease R shoulder pain to consistently 0-3/10 on the Numeric Pain Rating Scale.    Baseline Can be 5+/10    Time 6    Period Weeks    Status New    Target Date 01/21/21      PT LONG TERM GOAL #3   Title Improve R shoulder AROM for flexion to 170; IR to 60 and HA to 40 degrees.    Baseline 150; 35; 20    Time 6    Period Weeks    Status New    Target Date 01/21/21      PT LONG TERM GOAL #4   Title Improve R shoulder strength for IR and ER.    Baseline 9 pounds ER and 15 pounds IR.    Time 6    Period Weeks    Status New    Target Date 01/21/21      PT LONG TERM GOAL #5   Title Quintasha will be independent with her long-term HEP at DC.    Time 6    Period Weeks    Status New    Target Date 01/21/21                 Plan - 12/16/20 1156    Clinical Impression Statement Treatment was interpreted via ipad, she states her shoulder feels good  today however her pain is inthe Rt elbow and deltoid.  She appears to be having some nerve pain in the ulnar nerve.  Flossing was added to her HEP to see if this helps decrease pain. She required frequent cues to not "push into pain" while performing her exercise.  Language barrier was a challenge to recieve good feedback and attain understanding.  No goal met, only second visit. PT schedule was reviewed with pt at end of session.    Rehab Potential Good    PT Frequency 2x / week    PT Duration 6 weeks    PT Treatment/Interventions ADLs/Self Care Home Management;Cryotherapy;Therapeutic activities;Therapeutic exercise;Neuromuscular re-education;Patient/family education;Manual techniques;Passive range of motion    PT Next Visit Plan IR stretching, scapular and RTC strengthening, assess ulnar nerve    PT Home Exercise Plan Access Code: [email or web address]    Consulted and Agree with Plan of Care Patient           Patient will benefit from skilled therapeutic intervention in order to improve the following deficits and impairments:  Decreased activity tolerance,Decreased endurance,Decreased range of motion,Decreased strength,Increased edema,Impaired flexibility,Impaired UE functional use,Pain  Visit Diagnosis: Localized edema  Muscle weakness (generalized)  Stiffness of right shoulder, not elsewhere classified  Chronic right shoulder pain     Problem List Patient Active Problem List   Diagnosis Date Noted  . OSA (obstructive sleep apnea) 03/11/2019  . Hemorrhoids 08/21/2017    Jeral Pinch PT  12/16/2020, 12:42 PM  [facility] Physical Therapy [address] [place], [place], [address] Phone: [phone]   Fax:  [phone]  Name: Frances Martin MRN: [id number] Date of Birth: 11/19/1960

## 2020-12-17 ENCOUNTER — Ambulatory Visit
Admission: RE | Admit: 2020-12-17 | Discharge: 2020-12-17 | Disposition: A | Discharge status: Home / Self Care | Payer: No Typology Code available for payment source | Source: Ambulatory Visit | Attending: Primary Care | Admitting: Primary Care

## 2020-12-17 DIAGNOSIS — Z1231 Encounter for screening mammogram for malignant neoplasm of breast: Secondary | ICD-10-CM

## 2020-12-23 ENCOUNTER — Encounter: Payer: No Typology Code available for payment source | Admitting: Rehabilitative and Restorative Service Providers"

## 2020-12-25 ENCOUNTER — Encounter: Payer: No Typology Code available for payment source | Admitting: Rehabilitative and Restorative Service Providers"

## 2020-12-29 ENCOUNTER — Encounter: Payer: Self-pay | Admitting: Physical Therapy

## 2020-12-29 ENCOUNTER — Ambulatory Visit (INDEPENDENT_AMBULATORY_CARE_PROVIDER_SITE_OTHER): Payer: Self-pay | Admitting: Physical Therapy

## 2020-12-29 ENCOUNTER — Other Ambulatory Visit: Payer: Self-pay

## 2020-12-29 DIAGNOSIS — R600 Localized edema: Secondary | ICD-10-CM

## 2020-12-29 DIAGNOSIS — M25511 Pain in right shoulder: Secondary | ICD-10-CM

## 2020-12-29 DIAGNOSIS — M25611 Stiffness of right shoulder, not elsewhere classified: Secondary | ICD-10-CM

## 2020-12-29 DIAGNOSIS — M6281 Muscle weakness (generalized): Secondary | ICD-10-CM

## 2020-12-29 DIAGNOSIS — G8929 Other chronic pain: Secondary | ICD-10-CM

## 2020-12-29 NOTE — Therapy (Signed)
[facility] Physical Therapy [address] [place], [place], [address] Phone: [phone]   Fax:  [phone]  Physical Therapy Treatment  Patient Details  Name: Frances Martin MRN: [id number] Date of Birth: June 20, 1961 Referring Provider (PT): Julieanne Cotton PA-C   Encounter Date: 12/29/2020   PT End of Session - 12/29/20 0822    Visit Number 3    Number of Visits 12    PT Start Time 0800    PT Stop Time 0838    PT Time Calculation (min) 38 min    Activity Tolerance Patient tolerated treatment well;Patient limited by pain    Behavior During Therapy [facility] for tasks assessed/performed           Past Medical History:  Diagnosis Date  . Hemorrhoids   . Hyperlipidemia   . Hypertension   . Pre-diabetes     Past Surgical History:  Procedure Laterality Date  . CESAREAN SECTION     3 previous  . COLONOSCOPY WITH PROPOFOL N/A 11/14/2017   Procedure: COLONOSCOPY WITH PROPOFOL;  Surgeon: [name], MD;  Location: [facility] ENDOSCOPY;  Service: Gastroenterology;  Laterality: N/A;  . COLONOSCOPY WITH PROPOFOL N/A 01/22/2018   Procedure: COLONOSCOPY WITH PROPOFOL;  Surgeon: [name], MD;  Location: [facility] ENDOSCOPY;  Service: Gastroenterology;  Laterality: N/A;  . TUBAL LIGATION      There were no vitals filed for this visit.   Subjective Assessment - 12/29/20 0814    Subjective Pt arriving to therapy reporting 8/10 pain in R shoulder radiation to elbow.    Pertinent History Pre-diabetes, smoker    Limitations Lifting    Diagnostic tests MRI    Patient Stated Goals Avoid surgery and improve pain    Currently in Pain? Yes    Pain Score 8     Pain Location Shoulder    Pain Orientation Right    Pain Descriptors / Indicators Sore;Aching    Pain Type Acute pain    Pain Onset More than a month ago                             [facility] Adult PT Treatment/Exercise - 12/29/20 0001      Shoulder Exercises: Seated   Other Seated Exercises 10  reps Rt UE ulnar nerve flossing      Shoulder Exercises: Standing   External Rotation Strengthening;15 reps    Theraband Level (Shoulder External Rotation) Level 2 (Red)    External Rotation Limitations slow controlled    Flexion Right;10 reps   wall slides   Row Strengthening;15 reps    Other Standing Exercises ball up the wall x 10 holding stretch 5 seconds    Other Standing Exercises 15 reps scapular retraction with hands behind head      Shoulder Exercises: Stretch   Other Shoulder Stretches low and mid doorway stretch Rt UE 20sec, pt reports a lot of pain in deltoid with mid      Manual Therapy   Manual Therapy Soft tissue mobilization    Soft tissue mobilization IASTM to Rt deltoid                    PT Short Term Goals - 12/29/20 9563      PT SHORT TERM GOAL #1   Title Frances Martin will be independent with her starter HEP.    Status On-going             PT Long Term Goals -  12/10/20 1724      PT LONG TERM GOAL #1   Title Improve FOTO to 62.    Baseline 42    Time 6    Period Weeks    Status New    Target Date 01/21/21      PT LONG TERM GOAL #2   Title Decrease R shoulder pain to consistently 0-3/10 on the Numeric Pain Rating Scale.    Baseline Can be 5+/10    Time 6    Period Weeks    Status New    Target Date 01/21/21      PT LONG TERM GOAL #3   Title Improve R shoulder AROM for flexion to 170; IR to 60 and HA to 40 degrees.    Baseline 150; 35; 20    Time 6    Period Weeks    Status New    Target Date 01/21/21      PT LONG TERM GOAL #4   Title Improve R shoulder strength for IR and ER.    Baseline 9 pounds ER and 15 pounds IR.    Time 6    Period Weeks    Status New    Target Date 01/21/21      PT LONG TERM GOAL #5   Title Frances Martin will be independent with her long-term HEP at DC.    Time 6    Period Weeks    Status New    Target Date 01/21/21                 Plan - 12/29/20 6384    Clinical Impression Statement Pt arriving  to therapy today with in person intrepreter Frances Martin). Pt tolerating exercises well with pain reported in right deltoid and right elbow. Pt with mild increase in pain with ulnar nerve flossing and ROM in ER. Continue skilled PT to maximize function.    Personal Factors and Comorbidities Comorbidity 2    Comorbidities Pre-diabetes and smoker    Examination-Activity Limitations Dressing;Sleep;Lift;Reach Overhead;Carry    Examination-Participation Restrictions Cleaning;Community Activity;Driving    Stability/Clinical Decision Making Stable/Uncomplicated    Rehab Potential Good    PT Frequency 2x / week    PT Duration 6 weeks    PT Treatment/Interventions ADLs/Self Care Home Management;Cryotherapy;Therapeutic activities;Therapeutic exercise;Neuromuscular re-education;Patient/family education;Manual techniques;Passive range of motion    PT Next Visit Plan IR stretching, scapular and RTC strengthening,  ulnar nerve flossing as indicated    Consulted and Agree with Plan of Care Patient           Patient will benefit from skilled therapeutic intervention in order to improve the following deficits and impairments:  Decreased activity tolerance,Decreased endurance,Decreased range of motion,Decreased strength,Increased edema,Impaired flexibility,Impaired UE functional use,Pain  Visit Diagnosis: Localized edema  Muscle weakness (generalized)  Stiffness of right shoulder, not elsewhere classified  Chronic right shoulder pain     Problem List Patient Active Problem List   Diagnosis Date Noted  . OSA (obstructive sleep apnea) 03/11/2019  . Hemorrhoids 08/21/2017    Sharmon Leyden, PT, MPT 12/29/2020, 8:39 AM  [facility] Physical Therapy [address] [place], [place], [address] Phone: [phone]   Fax:  [phone]  Name: Frances Martin MRN: [id number] Date of Birth: 12-14-1960

## 2020-12-31 ENCOUNTER — Encounter: Payer: Self-pay | Admitting: Rehabilitative and Restorative Service Providers"

## 2020-12-31 ENCOUNTER — Ambulatory Visit (INDEPENDENT_AMBULATORY_CARE_PROVIDER_SITE_OTHER): Payer: Self-pay | Admitting: Rehabilitative and Restorative Service Providers"

## 2020-12-31 ENCOUNTER — Other Ambulatory Visit: Payer: Self-pay

## 2020-12-31 DIAGNOSIS — M25611 Stiffness of right shoulder, not elsewhere classified: Secondary | ICD-10-CM

## 2020-12-31 DIAGNOSIS — R600 Localized edema: Secondary | ICD-10-CM

## 2020-12-31 DIAGNOSIS — M25511 Pain in right shoulder: Secondary | ICD-10-CM

## 2020-12-31 DIAGNOSIS — M6281 Muscle weakness (generalized): Secondary | ICD-10-CM

## 2020-12-31 DIAGNOSIS — G8929 Other chronic pain: Secondary | ICD-10-CM

## 2020-12-31 NOTE — Therapy (Addendum)
[facility] [address] [place], [place], [address] Phone: [phone]   Fax:  [phone]  Physical Therapy Treatment /Discharge  Patient Details  Name: Frances Martin MRN: [id number] Date of Birth: [birth year] Referring Provider (PT): Donella Stade PA-C   Encounter Date: 12/31/2020   PT End of Session - 12/31/20 1047     Visit Number 4    Number of Visits 12    PT Start Time 0802    PT Stop Time 0845    PT Time Calculation (min) 43 min    Activity Tolerance Patient tolerated treatment well;Patient limited by pain    Behavior During Therapy [facility] for tasks assessed/performed             Past Medical History:  Diagnosis Date   Hemorrhoids    Hyperlipidemia    Hypertension    Pre-diabetes     Past Surgical History:  Procedure Laterality Date   CESAREAN SECTION     3 previous   COLONOSCOPY WITH PROPOFOL N/A 11/14/2017   Procedure: COLONOSCOPY WITH PROPOFOL;  Surgeon: Jonathon Bellows, MD;  Location: [facility] ENDOSCOPY;  Service: Gastroenterology;  Laterality: N/A;   COLONOSCOPY WITH PROPOFOL N/A 01/22/2018   Procedure: COLONOSCOPY WITH PROPOFOL;  Surgeon: Jonathon Bellows, MD;  Location: [facility] ENDOSCOPY;  Service: Gastroenterology;  Laterality: N/A;   TUBAL LIGATION      There were no vitals filed for this visit.   Subjective Assessment - 12/31/20 0834     Subjective Frances Martin notes the theraband is getting easier.  She gets pinching less frequently but it can still pinch and be severe.    Pertinent History Pre-diabetes, smoker    Limitations Lifting    Diagnostic tests MRI    Patient Stated Goals Avoid surgery and improve pain    Currently in Pain? Yes    Pain Score 3     Pain Location Shoulder    Pain Orientation Right    Pain Descriptors / Indicators Aching;Sore    Pain Type Acute pain    Pain Radiating Towards Lateral arm to the elbow    Pain Onset More than a month ago    Pain Frequency Constant    Aggravating Factors   Reaching overhead or behind the back, sleeping on the R side and lifting anything with the R side    Pain Relieving Factors Rest and be careful with movement    Effect of Pain on Daily Activities Unable to participate in more active engagements like dancing or playing with her grandchild    Multiple Pain Sites No                               [facility] Adult PT Treatment/Exercise - 12/31/20 0001       Therapeutic Activites    Therapeutic Activities Other Therapeutic Activities    Other Therapeutic Activities Discussed surgical vs non-surgical treatment, anatomy and exercise review      Exercises   Exercises Shoulder      Shoulder Exercises: Supine   Protraction Strengthening;Right;20 reps;Limitations    Protraction Weight (lbs) 3#    Protraction Limitations 2 sets hold 3 seconds (supine arm raises with extra reach at top, palm in)      Shoulder Exercises: Seated   Retraction Strengthening;Both;10 reps;Limitations    Retraction Limitations 5 seconds (shoulder blade pinches)      Shoulder Exercises: Standing   External Rotation Strengthening;Right;10 reps;Theraband;Limitations    Theraband Level (  Shoulder External Rotation) Level 2 (Red)    External Rotation Limitations slow eccentrics full range 3 sets      Shoulder Exercises: ROM/Strengthening   UBE (Upper Arm Bike) 10 minutes at 20 seconds pull/20 seconds push/20 seconds rest      Modalities   Modalities Cryotherapy      Cryotherapy   Number Minutes Cryotherapy 10 Minutes    Cryotherapy Location Shoulder    Type of Cryotherapy Ice pack                    PT Education - 12/31/20 0844     Education Details Spent time reviewing shoulder anatomy and what to expect with a RTC repair post-surgery IF this is an option.  Reviewed emphasis on day 1 HEP.    Person(s) Educated Patient    Methods Explanation;Demonstration;Tactile cues;Verbal cues    Comprehension Verbal cues required;Need further  instruction;Returned demonstration;Verbalized understanding;Tactile cues required              PT Short Term Goals - 12/31/20 0845       PT SHORT TERM GOAL #1   Title Frances Martin will be independent with her starter HEP.    Status Achieved               PT Long Term Goals - 12/31/20 0845       PT LONG TERM GOAL #1   Title Improve FOTO to 62.    Baseline 42    Time 6    Period Weeks    Status On-going      PT LONG TERM GOAL #2   Title Decrease R shoulder pain to consistently 0-3/10 on the Numeric Pain Rating Scale.    Baseline Can be 5+/10    Time 6    Period Weeks    Status On-going      PT LONG TERM GOAL #3   Title Improve R shoulder AROM for flexion to 170; IR to 60 and HA to 40 degrees.    Baseline 150; 35; 20    Time 6    Period Weeks    Status On-going      PT LONG TERM GOAL #4   Title Improve R shoulder strength for IR and ER.    Baseline 9 pounds ER and 15 pounds IR.    Time 6    Period Weeks    Status On-going      PT LONG TERM GOAL #5   Title Frances Martin will be independent with her long-term HEP at DC.    Time 6    Period Weeks    Status On-going                   Plan - 12/31/20 1047     Clinical Impression Statement Frances Martin notes progress with her strength and decrease in frequency of painful pinches.  She can still get a sharp stabbing pain in certain positions.  She had several questions about RTC repair if that is necessary and I shared my experience and reviewed shoulder anatomy.  RA next week.    Personal Factors and Comorbidities Comorbidity 2    Comorbidities Pre-diabetes and smoker    Examination-Activity Limitations Dressing;Sleep;Lift;Reach Overhead;Carry    Examination-Participation Restrictions Cleaning;Community Activity;Driving    Stability/Clinical Decision Making Stable/Uncomplicated    Rehab Potential Good    PT Frequency 2x / week    PT Duration 6 weeks    PT Treatment/Interventions ADLs/Self Care Home  Management;Cryotherapy;Therapeutic activities;Therapeutic  exercise;Neuromuscular re-education;Patient/family education;Manual techniques;Passive range of motion    PT Next Visit Plan Scapular and RTC strengthening    PT Home Exercise Plan Scapular protraction, retraction and theraband ER    Consulted and Agree with Plan of Care Patient             Patient will benefit from skilled therapeutic intervention in order to improve the following deficits and impairments:  Decreased activity tolerance,Decreased endurance,Decreased range of motion,Decreased strength,Increased edema,Impaired flexibility,Impaired UE functional use,Pain  Visit Diagnosis: Muscle weakness (generalized)  Stiffness of right shoulder, not elsewhere classified  Chronic right shoulder pain  Localized edema     Problem List Patient Active Problem List   Diagnosis Date Noted   OSA (obstructive sleep apnea) 03/11/2019   Hemorrhoids 08/21/2017    Farley Ly PT, MPT 12/31/2020, 10:58 AM  PHYSICAL THERAPY DISCHARGE SUMMARY  Visits from Start of Care: 4  Current functional level related to goals / functional outcomes: See note   Remaining deficits: See note   Education / Equipment: HEP   Patient agrees to discharge. Patient goals were partially met. Patient is being discharged due to not returning since the last visit.  Scot Jun, PT, DPT, OCS, ATC 02/09/21  1:24 PM     [facility] [address] [place], [place], [address] Phone: [phone]   Fax:  [phone]  Name: Frances Martin MRN: [id number] Date of Birth: October 01, 1960

## 2021-01-05 ENCOUNTER — Other Ambulatory Visit: Payer: Self-pay

## 2021-01-05 MED FILL — Celecoxib Cap 200 MG: ORAL | 30 days supply | Qty: 60 | Fill #0 | Status: AC

## 2021-01-05 MED FILL — Metformin HCl Tab ER 24HR 500 MG: ORAL | 30 days supply | Qty: 60 | Fill #0 | Status: AC

## 2021-01-28 ENCOUNTER — Other Ambulatory Visit: Payer: Self-pay

## 2021-01-28 ENCOUNTER — Encounter (INDEPENDENT_AMBULATORY_CARE_PROVIDER_SITE_OTHER): Payer: Self-pay | Admitting: Primary Care

## 2021-01-28 ENCOUNTER — Ambulatory Visit (INDEPENDENT_AMBULATORY_CARE_PROVIDER_SITE_OTHER): Payer: Self-pay | Admitting: Primary Care

## 2021-01-28 VITALS — BP 120/78 | HR 85 | Temp 97.3°F | Resp 20 | Ht 63.75 in | Wt 192.0 lb

## 2021-01-28 DIAGNOSIS — F17200 Nicotine dependence, unspecified, uncomplicated: Secondary | ICD-10-CM

## 2021-01-28 DIAGNOSIS — Z6833 Body mass index (BMI) 33.0-33.9, adult: Secondary | ICD-10-CM

## 2021-01-28 DIAGNOSIS — M2550 Pain in unspecified joint: Secondary | ICD-10-CM

## 2021-01-28 DIAGNOSIS — E119 Type 2 diabetes mellitus without complications: Secondary | ICD-10-CM

## 2021-01-28 DIAGNOSIS — I10 Essential (primary) hypertension: Secondary | ICD-10-CM

## 2021-01-28 DIAGNOSIS — E6609 Other obesity due to excess calories: Secondary | ICD-10-CM

## 2021-01-28 DIAGNOSIS — M25512 Pain in left shoulder: Secondary | ICD-10-CM

## 2021-01-28 DIAGNOSIS — Z7185 Encounter for immunization safety counseling: Secondary | ICD-10-CM

## 2021-01-28 DIAGNOSIS — G8929 Other chronic pain: Secondary | ICD-10-CM

## 2021-01-28 LAB — GLUCOSE, POCT (MANUAL RESULT ENTRY): POC Glucose: 211 mg/dl — AB (ref 70–99)

## 2021-01-28 LAB — POCT GLYCOSYLATED HEMOGLOBIN (HGB A1C): Hemoglobin A1C: 6.6 % — AB (ref 4.0–5.6)

## 2021-01-28 MED ORDER — BUPROPION HCL ER (SR) 150 MG PO TB12
150.0000 mg | ORAL_TABLET | Freq: Two times a day (BID) | ORAL | 1 refills | Status: DC
  Filled 2021-01-28: qty 60, 30d supply, fill #0
  Filled 2021-02-08: qty 180, 90d supply, fill #0

## 2021-01-28 MED ORDER — METFORMIN HCL ER 500 MG PO TB24
ORAL_TABLET | ORAL | 1 refills | Status: DC
  Filled 2021-01-28: qty 60, 30d supply, fill #0
  Filled 2021-02-08: qty 180, 90d supply, fill #0

## 2021-01-28 MED ORDER — LOSARTAN POTASSIUM 50 MG PO TABS
ORAL_TABLET | Freq: Every day | ORAL | 3 refills | Status: DC
  Filled 2021-01-28: qty 30, 30d supply, fill #0
  Filled 2021-02-08: qty 90, 90d supply, fill #0

## 2021-01-28 MED ORDER — CELECOXIB 200 MG PO CAPS
200.0000 mg | ORAL_CAPSULE | Freq: Two times a day (BID) | ORAL | 1 refills | Status: DC
Start: 2021-01-28 — End: 2021-03-18
  Filled 2021-01-28: qty 60, 30d supply, fill #0
  Filled 2021-02-08: qty 180, 90d supply, fill #0

## 2021-01-28 MED ORDER — PNEUMOCOCCAL VAC POLYVALENT 25 MCG/0.5ML IJ INJ
0.5000 mL | INJECTION | INTRAMUSCULAR | 0 refills | Status: AC
  Filled 2021-01-28: qty 2.5, 5d supply, fill #0

## 2021-01-28 NOTE — Progress Notes (Signed)
Check up-  Medication refill Discuss reflux

## 2021-01-28 NOTE — Progress Notes (Signed)
Subjective:   Frances Martin [id number]  Frances Martin is a 60 y.o. female here for a routine exam.  Current complaints: arthralgia .  Personal health questionnaire reviewed: no.   Gynecologic History No LMP recorded. Patient is postmenopausal. Last mammogram: 12/17/20. Results were: normal We are pleased to inform you that the results of your recent breast imaging examination performed on 12/17/20 shows no findings suspicious for malignancy.  It is recommended that you have your next mammogram in one year. Obstetric History OB History  Gravida Para Term Preterm AB Living  3         3  SAB IAB Ectopic Multiple Live Births          3    # Outcome Date GA Lbr Len/2nd Weight Sex Delivery Anes PTL Lv  3 Gravida           2 Gravida           1 Gravida              The following portions of the patient's history were reviewed and updated as appropriate: allergies, current medications, past family history, past medical history, past social history, past surgical history, and problem list.  Review of Systems Constitutional: negative Eyes: negative Ears, nose, mouth, throat, and face: negative Respiratory: negative Cardiovascular: negative Gastrointestinal: negative Genitourinary:negative Musculoskeletal:negative Neurological: negative Behavioral/Psych: negative Endocrine: negative    Objective:    Ht 5' 3.75" (1.619 m)   Wt 192 lb (87.1 kg)   BMI 33.22 kg/m   General Appearance:    Alert, cooperative, obese female no distress, appears stated age  Head:    Normocephalic, without obvious abnormality, atraumatic  Eyes:    PERRL, conjunctiva/corneas clear, EOM's intact, fundi    benign, both eyes  Ears:    Normal TM's and external ear canals, both ears  Nose:   Nares normal, septum midline, mucosa normal, no drainage    or sinus tenderness  Throat:   Lips, mucosa, and tongue normal; teeth and gums normal  Neck:   Supple, symmetrical, trachea midline, no adenopathy;    thyroid:  no  enlargement/tenderness/nodules; no carotid   bruit or JVD  Back:     Symmetric, no curvature, ROM normal, no CVA tenderness  Lungs:     Clear to auscultation bilaterally, respirations unlabored  Chest Wall:    No tenderness or deformity   Heart:    Regular rate and rhythm, S1 and S2 normal, no murmur, rub   or gallop     Abdomen:     Soft, non-tender, bowel sounds active all four quadrants,    no masses, no organomegaly        Extremities:   Extremities normal, atraumatic, no cyanosis or edema  Pulses:   2+ and symmetric all extremities  Skin:   Skin color, texture, turgor normal, no rashes or lesions  Lymph nodes:   Cervical, supraclavicular, and axillary nodes normal  Neurologic:   CNII-XII intact, normal strength, sensation and reflexes    throughout      Assessment:    Healthy female exam.    Plan:    Education reviewed: low fat, low cholesterol diet and self breast exams.   Frances Martin was seen today for diabetes and medication refill.  Diagnoses and all orders for this visit:  Type 2 diabetes mellitus without complication, without long-term current use of insulin (HCC) -     HgB A1c -     Glucose (CBG) -  metFORMIN (GLUCOPHAGE-XR) 500 MG 24 hr tablet; TAKE 1 TABLET (500 MG TOTAL) BY MOUTH 2 (TWO) TIMES DAILY AFTER A MEAL.  Essential hypertension -     losartan (COZAAR) 50 MG tablet; TAKE 1 TABLET (50 MG TOTAL) BY MOUTH DAILY.  Vaccine counseling -     Pneumococcal conjugate vaccine 20-valent (Prevnar 20)  Tobacco dependence -     buPROPion (WELLBUTRIN SR) 150 MG 12 hr tablet; Take 1 tablet (150 mg total) by mouth 2 (two) times daily.  Chronic left shoulder pain -     celecoxib (CELEBREX) 200 MG capsule; Take 1 capsule (200 mg total) by mouth 2 (two) times daily.  Arthralgia, unspecified joint -     celecoxib (CELEBREX) 200 MG capsule; Take 1 capsule (200 mg total) by mouth 2 (two) times daily.   Class 1 obesity due to excess calories without serious comorbidity  with body mass index (BMI) of 33.0 to 33.9 in adult Obesity is 30-39 indicating an excess in caloric intake or underlining conditions. This may lead to other co-morbidities. Lifestyle modifications of diet and exercise may reduce obesity.        Grayce Sessions

## 2021-01-28 NOTE — Patient Instructions (Addendum)
Bupropion Tablets (Depression/Mood Disorders) Qu es este medicamento? El BUPROPION se [place] ([place]) Islandsutiliza para tratar la depresin. Este medicamento puede ser utilizado para otros usos; si tiene Designer, industrial/productalguna preguntaconsulte con su proveedor de atencin mdica o con su farmacutico. Este medicamento puede ser utilizado para otros usos; si tiene Designer, industrial/productalguna preguntaconsulte con su proveedor de atencin mdica o con su farmacutico. MARCAS COMUNES: Wellbutrin Qu le debo informar a mi profesional de la salud antes de tomar estemedicamento? Necesita saber si usted presenta alguno de los siguientes problemas osituaciones: si tiene un trastorno de Psychologist, sport and exercisela alimentacin, como anorexia o bulimia trastorno bipolar o psicosis diabetes o alto nivel de Bankerazcar en la sangre, tratado con medicamento glaucoma enfermedad cardiaca, ataque cardiaco previo o latidos cardiacos irregulares lesin de la cabeza o tumor cerebral alta presin sangunea enfermedad renal o heptica convulsiones ideas suicidas o intento de suicidio previo sndrome de [place] prdida de peso una reaccin alrgica o inusual al bupropion, otros medicamentos, alimentos, colorantes o conservantes si est embarazada o buscando quedar embarazada si est amamantando a un beb Cmo debo utilizar este medicamento? Tome este medicamento por va oral con un vaso de agua. Siga las instrucciones de la etiqueta del [place] E[place]amedicamento. Puede tomarlo con o sin alimentos. Si el Social workermedicamento le produce malestar estomacal, tmelo con alimentos. Tome su medicamento a intervalos regulares. No tome su medicamento con una frecuencia mayor a la indicada. No deje de tomar PPL Corporationeste medicamento de repente a menos que as lo indique su mdico. Dejar de Chemical engineerutilizar este medicamento demasiado rpidopuede causar efectos secundarios graves o podra empeorar su afeccin. Su farmacutico le dar una Gua del medicamento especial (MedGuide, nombre en ingls) con cada receta y en cada ocasin que la vuelva a  surtir. Asegrese deleer esta informacin cada vez cuidadosamente. Hable con su pediatra para informarse acerca del uso de este medicamento ennios. Puede requerir atencin especial. Sobredosis: Pngase en contacto inmediatamente con un centro toxicolgico o unasala de urgencia si usted cree que haya tomado demasiado medicamento. ATENCIN: Reynolds AmericanEste medicamento es solo para usted. No comparta este medicamento connadie. Sobredosis: Pngase en contacto inmediatamente con un centro toxicolgico o unasala de urgencia si usted cree que haya tomado demasiado medicamento. ATENCIN: Reynolds AmericanEste medicamento es solo para usted. No comparta este medicamento connadie. Qu sucede si me olvido de una dosis? Si olvida una dosis, tmela lo antes posible. Si faltan menos de cuatro horas para la prxima dosis, tome slo esa dosis y sltese la dosis olvidada. No tomedosis adicionales o dobles. Qu puede interactuar con este medicamento? No tome este medicamento con ninguno de los siguientes frmacos: linezolida IMAO, tales como Azilect, Carbex, Eldepryl, Marplan, Nardil y Parnate azul de metileno (inyectado en una vena) otros medicamentos que contienen bupropin tal como Zyban Este medicamento tambin puede Product/process development scientistinteractuar con los siguientes medicamentos: alcohol ciertos medicamentos para la ansiedad o para conciliar el sueo ciertos medicamentos para la presin sangunea, tales como metoprolol, propranolol ciertos medicamentos para la depresin o trastornos psicticos ciertos medicamentos para el VIH o SIDA, tales como efavirenz, lopinavir, nelfinavir, ritonavir ciertos medicamentos para el ritmo cardiaco irregular, tales como propafenona, flecainida ciertos medicamentos para el mal de Parkinson, tales como amantadina, levodopa ciertos medicamentos para convulsiones, tales como carbamazepina, fenitona,  fenobarbital cimetidina clopidogrel ciclofosfamida digoxina furazolidona isoniazida nicotina orfenadrina procarbazina medicamentos esteroideos, como la prednisona o la cortisona medicamentos estimulantes para trastornos de Visual merchandiseratencin, Publishing copybajar de peso o mantenerse despierto tamoxifeno teofilina tiotepa ticlopidina tramadol warfarina Puede ser que esta lista no menciona todas las posibles interacciones. Informe a su profesional de  la salud de [employer] Inc productos a base de hierbas, medicamentos de venta libre o suplementos nutritivos que est tomando. Si usted fuma, consume bebidas alcohlicas o si utiliza drogas ilegales, indqueselo tambin a su profesional de [employer]. Algunas sustancias pueden interactuar consu medicamento. Puede ser que esta lista no menciona todas las posibles interacciones. Informe a su profesional de [employer] de [employer] Inc productos a base de hierbas, medicamentos de [place] o suplementos nutritivos que est tomando. Si usted fuma, consume bebidas alcohlicas o si utiliza drogas ilegales, indqueselo tambin a su profesional de [employer]. Algunas sustancias pueden interactuar consu medicamento. A qu debo estar atento al usar PPL Corporation? Informe a su mdico si los sntomas no mejoran o si empeoran. Visite a su mdico o a su proveedor de atencin mdica para que revise regularmente su evolucin. Debido a que se necesitan varias semanas para ver los efectos completos de este medicamento, es importante que contine con el tratamientosegn las indicaciones de su mdico. Este medicamento puede causar reacciones graves en la piel. Pueden suceder semanas a meses despus de comenzar a Astronomer. Contacte a su proveedor de atencin mdica de inmediato si nota que tiene fiebre o sntomas gripales con una erupcin. La erupcin puede ser roja o Frances Martin, y luego puede convertirse en ampollas o descamacin de la piel. O bien, es posible que observe una erupcin roja con  hinchazn en la cara, los labios o los ganglioslinfticos en el cuello o debajo de los brazos. Los pacientes y sus familias deben prestar atencin para Landscape architect aparicin o el empeoramiento de la depresin o las ideas suicidas. Tambin es necesario estar atento ante Medtronic, tales como sentirse ansioso, agitado, con pnico, irritable, hostil, agresivo, impulsivo, muy inquieto, excitado en exceso e hiperactivo, o no poder dormir. Si esto sucede, especialmente en el comienzo del tratamiento o despus de un cambio de dosis,llame a su proveedor de atencin mdica. Evite las bebidas alcohlicas mientras est tomando este medicamento. Beber bebidas alcohlicas en exceso, usar medicamentos para la ansiedad o para dormir, o Research scientist (life sciences) de usar estos agentes [place] se [place] estemedicamento puede aumentar su riesgo de tener convulsiones. No conduzca ni utilice maquinaria pesada hasta que sepa cmo le afecta este medicamento. Este medicamento puede afectar su capacidad para Careers information officer. No tome este medicamento cerca de la hora de [place]. Podra impedirle dormir. Se le podra secar la boca. Masticar chicle sin azcar, chupar caramelos duros y beber agua en abundancia le ayudar a mantener la boca hmeda. Si el problemano desaparece o es severo, consulte a su mdico. Qu efectos secundarios puedo tener al Chemical engineer este medicamento? Efectos secundarios que debe informar a su mdico o a su profesional de lasalud tan pronto como sea posible: Therapist, art, como erupcin cutnea, comezn/picazn o urticaria, e hinchazn de la cara, los labios o la lengua problemas para respirar cambios en la visin confusin estado de nimo elevado, menor necesidad de dormir, pensamientos acelerados, conducta impulsiva frecuencia cardiaca rpida o irregular alucinaciones, prdida del contacto con la realidad aumento de la presin sangunea erupcin, fiebre y ganglios linfticos  inflamados enrojecimiento, formacin de Chartered loss adjuster, descamacin o distensin de la piel, incluso dentro de la boca convulsiones ideas suicidas u otros cambios en el estado de nimo cansancio o debilidad inusual vmito Efectos secundarios que generalmente no requieren atencin mdica (infrmelos asu mdico o a su profesional de la salud si persisten o si son molestos): estreimiento dolor de cabeza prdida del apetito nuseas temblores  prdida de peso Puede ser que esta lista no menciona todos los posibles efectos secundarios. Comunquese a su mdico por asesoramiento mdico [employer]lett[employer]ard. Usted puede informar los efectos secundarios a la FDA por telfono al1-800-FDA-1088. Puede ser que esta lista no menciona todos los posibles efectos secundarios. Comunquese a su mdico por asesoramiento mdico [employer]lett[employer]ard. Usted puede informar los efectos secundarios a la FDA por telfono al1-800-FDA-1088. Dnde debo guardar mi medicina? Mantenga fuera del alcance de los nios. Guarde a San[employer]I, entre 20 y 25 grados Celsius (68 y 65 grados Fahrenheit), alejado de la luz directa del sol y la humedad. Mantenga bien cerrado. Deseche todo el medicamento que no haya utilizado despus de la fechade vencimiento. ATENCIN: Este folleto es un resumen. Puede ser que no cubra toda la posible informacin. Si usted tiene preguntas acerca de esta medicina, consulte con sumdico, su farmacutico o su profesional de Radiographer, therapeutic. ATENCIN: Este folleto es un resumen. Puede ser que no cubra toda la posible informacin. Si usted tiene preguntas acerca de esta medicina, consulte con sumdico, su farmacutico o su profesional de Radiographer, therapeutic.  2022 Elsevier/Gold Standard (2018-12-31 00:00:00)  Pneumococcal Conjugate Vaccine (PCV13): What You Need to Know 1. Por qu vacunarse? La vacuna antineumoccica conjugada (PCV13) puede prevenir la enfermedad neumoccica. Enfermedad neumoccica  hace referencia a cualquier enfermedad causada por bacterias neumoccicas. Estas bacterias pueden causar muchos tipos de enfermedades, entre ellas neumona, que es una infeccin de los pulmones. Las bacterias neumoccicas sonuna de las causas ms comunes de la neumona. Adems de neumona, las bacterias neumoccicas tambin pueden causar lo siguiente: Infecciones en los odos Infecciones de los senos paranasales Meningitis (infeccin del tejido que recubre el cerebro y la mdula espinal) Bacteriemia (infeccin de la Retail buyer) Cualquier persona puede contraer la enfermedad neumoccica, pero los nios menores de 2 aos, las personas con Theatre manager, los adultos mayoresde 65 aos y los fumadores son los que corren un mayor riesgo. La mayora de las infecciones neumoccicas son leves. Sin embargo, algunas pueden causar problemas a largo plazo, como dao cerebral o prdida de la audicin. La meningitis, la bacteriemia y la neumona causadas por laenfermedad neumoccica pueden ser mortales. 2. PCV13 La vacuna PCV13 protege contra 13 tipos de bacterias que causan la enfermedadneumoccica. Los bebs y los nios pequeos por lo general necesitan 4 dosis de la vacuna antineumoccica conjugada, a los 2, 4, 6 y 65 a 15 meses de edad. Los nios mayores ([employer]ub[employer]ol Corporation 59 meses de edad) pueden ser vacunados si no recibieron las dosis recomendadas. Tambin se recomienda una dosis de la vacuna PCV13 para adultos y nios de 6 aos de edad o mscon ciertas enfermedades si todava no recibieron la vacuna PCV13. Esta vacuna se puede administrar a adultos sanos de 65 aos o ms que todava no recibieron la vacuna PCV13 si as lo deciden tras conversar consu mdico. 3. Hable con el mdico Comunquese con la persona que le coloca las vacunas si la persona que la recibe: Ha tenido una reaccin alrgica despus de Neomia Dear dosis previa de la vacuna PCV13, a una vacuna neumoccica conjugada anterior conocida como PCV7 o a  cualquier vacuna que contenga toxoide diftrico (por ejemplo, difteria, ttanos y tos [place] [DTap]), o tiene [place] grave y potencialmente mortal En algunos casos, es posible que el mdico decida posponer la aplicacin de lavacuna PCV13 hasta una visita en el futuro. Las personas que sufren trastornos menores, como un resfro, pueden vacunarse. Las [employer] tienen enfermedades  moderadas o graves generalmente debenesperar hasta recuperarse para poder vacunarse con la PCV13. Su mdico puede darle ms informacin. 4. Riesgos de [place] reaccin a la vacuna Puede sentir enrojecimiento, hinchazn, dolor o sensibilidad en Immunologist de la inyeccin, y Scientist, research (physical sciences), prdida del apetito, nerviosismo (irritabilidad), cansancio, dolor de [place] y escalofros despus de recibir la vacunacin con PCV13. Los nios pequeos pueden correr un mayor riesgo de sufrir convulsiones causadas por la fiebre despus de la administracin de la vacuna PCV13 si esta se administra al mismo tiempo que una vacuna contra la gripe inactivada.Consulte a su mdico para obtener ms informacin. Las personas a veces se desmayan despus de procedimientos mdicos, incluida la vacunacin. Informe al mdico si se siente mareado, tiene cambios en la visino zumbidos en los odos. Al igual que con cualquier Automatic Data, existe una probabilidad muy remota deque una vacuna cause una reaccin alrgica grave, otra lesin grave o la muerte. 5. Qu pasa si se presenta un problema grave? Podra producirse una reaccin alrgica despus de que la persona vacunada abandone la clnica. Si observa signos de Runner, broadcasting/film/video grave (ronchas, hinchazn de la cara y la garganta, dificultad para respirar, latidos cardacos acelerados, mareos o debilidad), llame al 9-1-1 y lleve a la persona al hospital ms cercano. Si se presentan otros signos que le preocupan, comunquese con su mdico. Las reacciones adversas deben informarse al Sistema de Informe  de Eventos Adversos de Administrator, arts (VAERS). Por lo general, el mdico presenta este informe o puede hacerlo usted mismo. Visite el sitio web del VAERS en www.[email or web address].LAgents.no o llame al [phone]. El VAERS es solo para Biomedical engineer, y los miembros de su personal no proporcionan asesoramiento mdico. 6. Programa Nacional de Compensacin de Daos por [employer] [place] de Compensacin de Daos por Administrator, arts (VICP) es un programa federal que fue creado para compensar a las personas que puedan haber sufrido daos al recibir ciertas vacunas. Las Investment banker, operational a presuntas lesiones o muerte debidas a la vacunacin tienen un lmite de tiempo para su presentacin, que puede ser de tan solo [employer]. Visite el sitio web del VICP en SpiritualWord.[email or web address] o llame al [phone] para obtener ms informacin acerca del programa y de cmo presentar unreclamo. 7. Cmo puedo obtener ms informacin? Pregntele a su mdico. Comunquese con el servicio de salud de su localidad o su estado. Visite el sitio web de Tax inspector) (Administracin de Alimentos y Media planner) para ver los prospectos de las vacunas e informacin adicional en [email or web address]. Comunquese con Building control surveyor for [employer] Corporation) (Centros para el Control y la Prevencin de La Center): Llame al [phone] (1-800-CDC-INFO) o Visite el sitio web de [employer]TEL Corporation en [email or web address].uy. Declaracin de informacin sobre la vacuna PCV13 (03/20/2020) Esta informacin no tiene Theme park manager el consejo del mdico. Asegresede hacerle al mdico cualquier pregunta que tenga. Document Revised: 05/19/2020 Document Reviewed: 05/16/2020 Elsevier Patient Education  2022 [employer].

## 2021-02-04 ENCOUNTER — Other Ambulatory Visit: Payer: Self-pay

## 2021-02-08 ENCOUNTER — Other Ambulatory Visit (INDEPENDENT_AMBULATORY_CARE_PROVIDER_SITE_OTHER): Payer: Self-pay | Admitting: Primary Care

## 2021-02-08 ENCOUNTER — Other Ambulatory Visit: Payer: Self-pay

## 2021-02-08 NOTE — Telephone Encounter (Signed)
   Notes to clinic:  Medication not assigned to a protocol, review manually Medication filled by a different provider    Requested Prescriptions  Pending Prescriptions Disp Refills   amoxicillin (AMOXIL) 500 MG capsule 30 capsule 0    Sig: TAKE 1 CAPSULE BY MOUTH 3 TIMES DAILY UNTIL GONE      Off-Protocol Failed - 02/08/2021  1:08 PM      Failed - Medication not assigned to a protocol, review manually.      Passed - Valid encounter within last 12 months    Recent Outpatient Visits           1 week ago Type 2 diabetes mellitus without complication, without long-term current use of insulin (HCC)   [facility] Gwinda Passe P, NP   3 months ago Type 2 diabetes mellitus without complication, without long-term current use of insulin (HCC)   [facility] Gwinda Passe P, NP   9 months ago Need for immunization against influenza   [facility] Gwinda Passe P, NP   10 months ago Type 2 diabetes mellitus without complication, without long-term current use of insulin (HCC)   [facility] Gwinda Passe P, NP   12 months ago Type 2 diabetes mellitus without complication, without long-term current use of insulin (HCC)   [facility] Grayce Sessions, NP       Future Appointments             In 1 month Randa Evens, Kinnie Scales, NP [facility]

## 2021-02-10 ENCOUNTER — Other Ambulatory Visit: Payer: Self-pay

## 2021-03-18 ENCOUNTER — Ambulatory Visit (INDEPENDENT_AMBULATORY_CARE_PROVIDER_SITE_OTHER): Payer: Self-pay | Admitting: Primary Care

## 2021-03-18 ENCOUNTER — Other Ambulatory Visit: Payer: Self-pay

## 2021-03-18 ENCOUNTER — Encounter (INDEPENDENT_AMBULATORY_CARE_PROVIDER_SITE_OTHER): Payer: Self-pay | Admitting: Primary Care

## 2021-03-18 VITALS — BP 132/79 | HR 82 | Temp 97.5°F | Ht 62.0 in | Wt 191.0 lb

## 2021-03-18 DIAGNOSIS — F32A Depression, unspecified: Secondary | ICD-10-CM

## 2021-03-18 DIAGNOSIS — E119 Type 2 diabetes mellitus without complications: Secondary | ICD-10-CM

## 2021-03-18 DIAGNOSIS — G8929 Other chronic pain: Secondary | ICD-10-CM

## 2021-03-18 DIAGNOSIS — I10 Essential (primary) hypertension: Secondary | ICD-10-CM

## 2021-03-18 DIAGNOSIS — M25512 Pain in left shoulder: Secondary | ICD-10-CM

## 2021-03-18 DIAGNOSIS — F17210 Nicotine dependence, cigarettes, uncomplicated: Secondary | ICD-10-CM

## 2021-03-18 DIAGNOSIS — F17200 Nicotine dependence, unspecified, uncomplicated: Secondary | ICD-10-CM

## 2021-03-18 DIAGNOSIS — M2550 Pain in unspecified joint: Secondary | ICD-10-CM

## 2021-03-18 MED ORDER — CELECOXIB 200 MG PO CAPS
200.0000 mg | ORAL_CAPSULE | Freq: Two times a day (BID) | ORAL | 1 refills | Status: DC
  Filled 2021-03-18 – 2021-05-12 (×2): qty 180, 90d supply, fill #0

## 2021-03-18 MED ORDER — LOSARTAN POTASSIUM 50 MG PO TABS
ORAL_TABLET | Freq: Every day | ORAL | 3 refills | Status: DC
  Filled 2021-03-18: qty 90, fill #0
  Filled 2021-05-12: qty 90, 90d supply, fill #0

## 2021-03-18 MED ORDER — GLUCOSAMINE 500 MG PO CAPS
500.0000 mg | ORAL_CAPSULE | Freq: Every day | ORAL | 1 refills | Status: DC
  Filled 2021-03-18: qty 90, 90d supply, fill #0

## 2021-03-18 MED ORDER — METFORMIN HCL ER 500 MG PO TB24
ORAL_TABLET | ORAL | 1 refills | Status: DC
  Filled 2021-03-18: qty 180, fill #0
  Filled 2021-05-12: qty 180, 90d supply, fill #0

## 2021-03-18 MED ORDER — BUPROPION HCL ER (SR) 150 MG PO TB12
150.0000 mg | ORAL_TABLET | Freq: Two times a day (BID) | ORAL | 1 refills | Status: DC
  Filled 2021-03-18: qty 90, 45d supply, fill #0
  Filled 2021-05-12: qty 180, 90d supply, fill #0

## 2021-03-18 NOTE — Patient Instructions (Signed)
Dolor agudo de [employer] adultos Acute Knee Pain, Adult El dolor agudo de rodilla es repentino y puede deberse a dao, hinchazn o irritacin de los msculos y tejidos que sostienen la rodilla. El dolor puede ser consecuencia de lo siguiente: Cruzita Lederer. Una lesin en la rodilla debido a movimientos de rotacin. Un golpe en la rodilla. Una infeccin. El dolor agudo de Engineer, manufacturing systems solo con el tiempo y el descanso. De no ser as, su mdico podra indicarle que se haga estudios para hallar la causa del dolor. Pueden incluir: Estudios de diagnstico por imgenes, como una radiografa, una resonancia magntica (RM), una exploracin por tomografa computarizada (TC) o una ecografa. Aspiracin de una articulacin. En [employer], se extrae lquido de la rodilla y se evala. Artroscopia. En [employer], se introduce un tubo iluminado en la rodilla y se proyecta una imagen en una pantalla de televisin. Biopsia. En [employer], se toma [place] de tejido del organismo y se la estudia con un microscopio. Siga estas instrucciones en su casa: Si tiene una rodillera o un dispositivo ortopdico:  Use la rodillera o el dispositivo ortopdico como se lo haya indicado el mdico. Quteselos solamente como se lo haya indicado el mdico. Afljelos si siente hormigueo en los dedos del pie, se le adormecen o se le enfran y se tornan azulados. Mantngalos limpios. Si la rodillera o el dispositivo ortopdico no son impermeables: No deje que se mojen. Cbralos con un envoltorio hermtico cuando tome un bao de inmersin o una ducha.  Actividad Descanse la rodilla. No haga cosas que le causen dolor o que lo intensifiquen. Evite las actividades o los ejercicios de alto impacto, como correr, Public relations account executive soga o hacer saltos de tijera. Trabaje con un fisioterapeuta para crear un programa de ejercicios seguros, segn lo recomiende el mdico. Haga ejercicios como se lo haya indicado el  fisioterapeuta. Control del dolor, la rigidez y la hinchazn  Si se lo indican, aplique hielo sobre la rodilla afectada. Para hacer esto: Si [place] una rodillera o un dispositivo ortopdico desmontables, quteselos segn las indicaciones del mdico. Ponga el hielo en una bolsa plstica. Coloque una toalla entre la piel y Copy. Aplique el hielo durante 20 minutos, 2 o 3 veces por da. Retire el hielo si la piel se pone de color rojo brillante. Esto es Intel. Si no puede sentir dolor, calor o fro, tiene un mayor riesgo de que se dae la zona. Si se lo indican, use una venda elstica para ejercer presin (compresin) en la rodilla lesionada. Esto puede controlar la hinchazn, darle sostn y ayudar a Paramedic las [place]. Cuando est sentado o acostado, levante (eleve) la rodilla por encima del nivel del corazn. Duerma con una almohada debajo de la rodilla.  Indicaciones generales Use los medicamentos de venta libre y los recetados solamente como se lo haya indicado el mdico. No consuma ningn producto que contenga nicotina o tabaco, como cigarrillos, cigarrillos electrnicos y tabaco de Theatre manager. Si necesita ayuda para dejar de consumir estos productos, consulte al American Express. Si tiene sobrepeso, trabaje con el mdico y un nutricionista para establecer una meta de prdida de peso que sea saludable y razonable para usted. El exceso de peso puede generar presin en la rodilla. Est atento a cualquier cambio en los sntomas. Cumpla con todas las visitas de seguimiento. Esto es importante. Comunquese con un mdico si: El dolor de [place], [place]. Tiene fiebre junto con dolor de rodilla. La rodilla se  siente caliente al tacto o est enrojecida. La rodilla se le tuerce o se le traba. Solicite ayuda de inmediato si: La rodilla se hincha, y la hinchazn empeora. No puede mover la rodilla. Tiene un dolor intenso en la rodilla que no se puede controlar con  analgsicos. Resumen El dolor agudo de rodilla puede deberse a una cada, una lesin, una infeccin o a dao, hinchazn o irritacin de los tejidos que sostienen la rodilla. El mdico podra hacerle estudios para hallar la causa del dolor. Est atento a cualquier cambio en los sntomas. [employer] Corporation dolor con descanso, medicamentos, actividad de poca intensidad y [place] de hielo. Obtenga ayuda de inmediato si la rodilla se hincha, no puede mover la rodilla o tiene un dolor intenso que no se puede controlar con medicamentos. Esta informacin no tiene Theme park manager el consejo del mdico. Asegresede hacerle al mdico cualquier pregunta que tenga. Document Revised: 03/03/2020 Document Reviewed: 03/03/2020 Elsevier Patient Education  2022 [employer].

## 2021-03-18 NOTE — Progress Notes (Signed)
Subjective:    Frances Martin is a 60 y.o. female who presents with knee pain  involving the right knee. Onset was  she feels she may have twisted it . Inciting event: twisting injury while walking . Current symptoms include: crepitus sensation and pain located lateral . Pain is aggravated by any weight bearing, kneeling, rising after sitting, running, standing, and walking. Patient has had prior knee problems. Evaluation to date: none. Treatment to date: glucosamine which is effective.  The following portions of the patient's history were reviewed and updated as appropriate: allergies, current medications, past family history, past medical history, past social history, past surgical history, and problem list.   Review of Systems Pertinent items noted in HPI and remainder of comprehensive ROS otherwise negative.   Objective:    BP 132/79 (BP Location: Right Arm, Patient Position: Sitting, Cuff Size: Large)   Pulse 82   Temp (!) 97.5 F (36.4 C) (Temporal)   Ht 5\' 2"  (1.575 m)   Wt 191 lb (86.6 kg)   SpO2 94%   BMI 34.93 kg/m  Right knee: positive exam findings: crepitus  Left knee:  normal and no effusion, full active range of motion, no joint line tenderness, ligamentous structures intact.   X-ray  none : soft tissue swelling    Current Outpatient Medications on File Prior to Visit  Medication Sig Dispense Refill   aspirin EC 81 MG tablet Take 81 mg by mouth daily.     pantoprazole (PROTONIX) 40 MG tablet Take 1 tablet (40 mg total) by mouth daily. 30 tablet 2   pravastatin (PRAVACHOL) 10 MG tablet Take 1 tablet (10 mg total) by mouth daily. (Patient not taking: Reported on 03/18/2021) 90 tablet 1   No current facility-administered medications on file prior to visit.    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  associated with [name] was seen today for  follow-up.  Diagnoses and all orders for this visit:  Tobacco dependence - I have recommended complete cessation of tobacco use. I have discussed various options available for assistance with tobacco cessation including over the counter methods (Nicotine gum, patch and lozenges). We also discussed prescription options (Chantix, Nicotine Inhaler / Nasal Spray). The patient is not interested in pursuing any prescription tobacco cessation options at this time. - Patient declines at this time.  -   Depression, unspecified depression type Flowsheet Row Office Visit from 03/18/2021 in [facility]  PHQ-9 Total Score 16      Refer to CSW  Chronic left shoulder pain Celecoxib (CELEBREX) 200 MG capsule  bid  Arthralgia, unspecified joint Work on losing weight to help reduce joint pain. May alternate with heat and ice application for pain relief. May also alternate with Celebrex 200mg  bid as prescribed pain relief. Other alternatives include massage, acupuncture and water aerobics.  You must stay active and avoid a sedentary lifestyle.   Essential hypertension Counseled on blood pressure goal of less than 130/80, low-sodium, DASH diet, medication compliance, 150 minutes of moderate intensity exercise per week. Discussed medication compliance, adverse effects.   Type 2 diabetes mellitus without complication, without long-term current use of insulin (HCC)  A1c 6.6 well control on lifestyle modification  [facility]

## 2021-04-23 ENCOUNTER — Telehealth: Payer: Self-pay | Admitting: Clinical

## 2021-05-05 ENCOUNTER — Other Ambulatory Visit: Payer: Self-pay

## 2021-05-05 ENCOUNTER — Ambulatory Visit: Payer: Self-pay | Attending: Primary Care

## 2021-05-11 ENCOUNTER — Telehealth: Payer: Self-pay | Admitting: Primary Care

## 2021-05-11 NOTE — Telephone Encounter (Signed)
Copied from CRM [id number]. Topic: General - Other >> May 04, 2021  4:05 PM Jaquita Rector A wrote: Reason for CRM: Patient called in asking to speak to [facility]. Please call Ph# [phone]

## 2021-05-11 NOTE — Telephone Encounter (Signed)
I return Pt called, I LVM to call back when she need

## 2021-05-12 ENCOUNTER — Other Ambulatory Visit: Payer: Self-pay

## 2021-05-12 ENCOUNTER — Other Ambulatory Visit (INDEPENDENT_AMBULATORY_CARE_PROVIDER_SITE_OTHER): Payer: Self-pay | Admitting: Primary Care

## 2021-05-12 MED ORDER — PANTOPRAZOLE SODIUM 40 MG PO TBEC
40.0000 mg | DELAYED_RELEASE_TABLET | Freq: Every day | ORAL | 2 refills | Status: DC
  Filled 2021-05-12 – 2021-06-21 (×2): qty 30, 30d supply, fill #0

## 2021-05-12 NOTE — Telephone Encounter (Signed)
Sent to PCP ?

## 2021-05-13 ENCOUNTER — Other Ambulatory Visit: Payer: Self-pay

## 2021-05-20 ENCOUNTER — Other Ambulatory Visit: Payer: Self-pay

## 2021-05-20 ENCOUNTER — Telehealth: Payer: Self-pay

## 2021-05-20 NOTE — Telephone Encounter (Signed)
Call placed to patient with assistance of Spanish Interpreter # [id number]/[employer].  Patient in need of CPAP machine and she is currently uninsured and unable to afford the cost of a new CPAP machine.     Informed her about the [employer] ([employer]) CPAP Assistance Program now that it is up and running again post COVID.   They provide refurbished machines to individuals for a $100 program fee. There is no warranty or technical support that comes with the machine, it is provided " as is."    The program currently reports a wait of about 3.5-4 months for a machine to be available for delivery. The patient said that her husband should be able to pay the fee for her. Informed her that [employer] will contact her for payment. Spanish speaking interpreter requested. If she has difficulty paying at the time the fee is due, [facility] has funding available to assist with this.  She agreed to submitting the order for the CPAP machine to [employer] and have  the machine delivered to [facility].  When she picks up the machine at [facility], she will be asked to sign a waiver of liability for the [employer]     Application to be sent to Gwinda Passe, NP for signature.

## 2021-06-01 ENCOUNTER — Telehealth: Payer: Self-pay

## 2021-06-01 NOTE — Telephone Encounter (Signed)
Faxed on 05/27/2021 

## 2021-06-21 ENCOUNTER — Other Ambulatory Visit: Payer: Self-pay

## 2021-06-21 ENCOUNTER — Ambulatory Visit: Payer: No Typology Code available for payment source

## 2021-06-28 ENCOUNTER — Ambulatory Visit: Payer: Self-pay | Attending: Primary Care

## 2021-06-28 ENCOUNTER — Other Ambulatory Visit: Payer: Self-pay

## 2021-07-19 NOTE — Telephone Encounter (Signed)
Closed

## 2021-07-20 ENCOUNTER — Encounter (INDEPENDENT_AMBULATORY_CARE_PROVIDER_SITE_OTHER): Payer: Self-pay | Admitting: Primary Care

## 2021-07-20 ENCOUNTER — Other Ambulatory Visit: Payer: Self-pay

## 2021-07-20 ENCOUNTER — Ambulatory Visit (INDEPENDENT_AMBULATORY_CARE_PROVIDER_SITE_OTHER): Payer: Self-pay | Admitting: Primary Care

## 2021-07-20 VITALS — BP 142/77 | HR 85 | Temp 97.2°F | Ht 64.0 in | Wt 257.6 lb

## 2021-07-20 DIAGNOSIS — E785 Hyperlipidemia, unspecified: Secondary | ICD-10-CM

## 2021-07-20 DIAGNOSIS — F32A Depression, unspecified: Secondary | ICD-10-CM

## 2021-07-20 DIAGNOSIS — Z760 Encounter for issue of repeat prescription: Secondary | ICD-10-CM

## 2021-07-20 DIAGNOSIS — K2100 Gastro-esophageal reflux disease with esophagitis, without bleeding: Secondary | ICD-10-CM

## 2021-07-20 DIAGNOSIS — E119 Type 2 diabetes mellitus without complications: Secondary | ICD-10-CM

## 2021-07-20 DIAGNOSIS — M2550 Pain in unspecified joint: Secondary | ICD-10-CM

## 2021-07-20 DIAGNOSIS — E1169 Type 2 diabetes mellitus with other specified complication: Secondary | ICD-10-CM

## 2021-07-20 DIAGNOSIS — M25512 Pain in left shoulder: Secondary | ICD-10-CM

## 2021-07-20 DIAGNOSIS — R6882 Decreased libido: Secondary | ICD-10-CM

## 2021-07-20 DIAGNOSIS — G8929 Other chronic pain: Secondary | ICD-10-CM

## 2021-07-20 DIAGNOSIS — I10 Essential (primary) hypertension: Secondary | ICD-10-CM

## 2021-07-20 DIAGNOSIS — Z87891 Personal history of nicotine dependence: Secondary | ICD-10-CM

## 2021-07-20 LAB — POCT GLYCOSYLATED HEMOGLOBIN (HGB A1C): Hemoglobin A1C: 6.2 % — AB (ref 4.0–5.6)

## 2021-07-20 MED ORDER — METFORMIN HCL ER 500 MG PO TB24
ORAL_TABLET | ORAL | 1 refills | Status: DC
  Filled 2021-07-20: qty 180, fill #0

## 2021-07-20 MED ORDER — GLUCOSAMINE 500 MG PO CAPS
500.0000 mg | ORAL_CAPSULE | Freq: Every day | ORAL | 1 refills | Status: DC
  Filled 2021-07-20: qty 90, 90d supply, fill #0

## 2021-07-20 MED ORDER — BUPROPION HCL ER (SR) 150 MG PO TB12
ORAL_TABLET | ORAL | 1 refills | Status: DC
  Filled 2021-07-20: qty 60, 30d supply, fill #0
  Filled 2021-08-20: qty 120, 60d supply, fill #0

## 2021-07-20 MED ORDER — HYDROCHLOROTHIAZIDE 12.5 MG PO CAPS
12.5000 mg | ORAL_CAPSULE | Freq: Every day | ORAL | 1 refills | Status: DC
  Filled 2021-07-20: qty 30, 30d supply, fill #0
  Filled 2021-08-20: qty 90, 90d supply, fill #0

## 2021-07-20 MED ORDER — PANTOPRAZOLE SODIUM 40 MG PO TBEC
40.0000 mg | DELAYED_RELEASE_TABLET | Freq: Every day | ORAL | 2 refills | Status: DC
  Filled 2021-07-20: qty 30, 30d supply, fill #0
  Filled 2021-08-20: qty 60, 60d supply, fill #0

## 2021-07-20 MED ORDER — METFORMIN HCL ER 500 MG PO TB24
ORAL_TABLET | ORAL | 1 refills | Status: DC
  Filled 2021-07-20: qty 180, fill #0
  Filled 2021-08-20: qty 180, 90d supply, fill #0

## 2021-07-20 MED ORDER — PRAVASTATIN SODIUM 10 MG PO TABS
10.0000 mg | ORAL_TABLET | Freq: Every day | ORAL | 1 refills | Status: DC
  Filled 2021-07-20: qty 30, 30d supply, fill #0

## 2021-07-20 MED ORDER — LOSARTAN POTASSIUM 50 MG PO TABS
ORAL_TABLET | Freq: Every day | ORAL | 3 refills | Status: DC
  Filled 2021-07-20: qty 90, fill #0

## 2021-07-20 MED ORDER — CELECOXIB 200 MG PO CAPS
200.0000 mg | ORAL_CAPSULE | Freq: Two times a day (BID) | ORAL | 1 refills | Status: DC
  Filled 2021-07-20: qty 60, 30d supply, fill #0
  Filled 2021-08-20: qty 180, 90d supply, fill #0

## 2021-07-20 NOTE — Progress Notes (Signed)
Subjective:  [name], female    DOB: 1961-01-22  Age: 60 y.o. MRN: [id number]  CC: Follow-up (Lab work)/T2D   HPI Frances Martin  is a 59 year old Hispanic female (interpreter ANA  [id number])presents for follow-up of diabetes. Patient does not check blood sugar at home. Management of HTN- fasting did not take medication this AM. Denies shortness of breath, headaches, chest pain or lower extremity edema . She is please to report the medication work she stopped working. She does have insomnia for 3-7 days melatonin did not work. She has a multitude list of concern most addressed than complete on f/u  Compliant with meds - Yes Checking CBGs? No  Fasting avg -   Postprandial average -  Exercising regularly? - Yes Watching carbohydrate intake? - Yes Neuropathy ? - No Hypoglycemic events - No  - Recovers with :   Pertinent ROS:  Polyuria - No Polydipsia - No Vision problems - No  Medications as noted below. Taking them regularly without complication/adverse reaction being reported today.   History Frances Martin has a past medical history of Hemorrhoids, Hyperlipidemia, Hypertension, and Pre-diabetes.   She has a past surgical history that includes Tubal ligation; Colonoscopy with propofol (N/A, 11/14/2017); Colonoscopy with propofol (N/A, 01/22/2018); and Cesarean section.   Her family history includes Diabetes in her father and mother; Hypertension in her mother.She reports that she has been smoking cigarettes. She has been smoking an average of .5 packs per day. She has never used smokeless tobacco. She reports that she does not drink alcohol and does not use drugs.  Current Outpatient Medications on File Prior to Visit  Medication Sig Dispense Refill   aspirin EC 81 MG tablet Take 81 mg by mouth daily.     No current facility-administered medications on file prior to visit.    ROS Comprehensive ROS pertinent positive and negative noted in  HPI  Objective:  BP (!) 142/77 (BP Location: Right Arm, Patient Position: Sitting, Cuff Size: Large)   Pulse 85   Temp (!) 97.2 F (36.2 C) (Temporal)   Ht $R'5\' 4"'SR$  (1.626 m)   Wt 257 lb 9.6 oz (116.8 kg)   SpO2 96%   BMI 44.22 kg/m   BP Readings from Last 3 Encounters:  07/20/21 (!) 142/77  03/18/21 132/79  01/28/21 120/78    Wt Readings from Last 3 Encounters:  07/20/21 257 lb 9.6 oz (116.8 kg)  03/18/21 191 lb (86.6 kg)  01/28/21 192 lb (87.1 kg)   Physical exam: General: Vital signs reviewed.  Patient is well-developed and well-nourished, obese female in no acute distress and cooperative with exam. Head: Normocephalic and atraumatic. Eyes: EOMI, conjunctivae normal, no scleral icterus. Neck: Supple, trachea midline, normal ROM, no JVD, masses, thyromegaly, or carotid bruit present. Cardiovascular: RRR, S1 normal, S2 normal, no murmurs, gallops, or rubs. Pulmonary/Chest: Clear to auscultation bilaterally, no wheezes, rales, or rhonchi. Abdominal: Soft, non-tender, non-distended, BS +, no masses, organomegaly, or guarding present. Musculoskeletal: No joint deformities, erythema, or stiffness, ROM full and nontender. Extremities: No lower extremity edema bilaterally,  pulses symmetric and intact bilaterally. No cyanosis or clubbing. Neurological: A&O x3, Strength is normal Skin: Warm, dry and intact. No rashes or erythema. Psychiatric: Normal mood and affect. speech and behavior is normal. Cognition and memory are normal.    Lab Results  Component Value Date   HGBA1C 6.2 (A) 07/20/2021   HGBA1C 6.6 (A) 01/28/2021   HGBA1C 6.4 (A) 10/13/2020    Lab Results  Component Value Date   WBC 8.0 07/20/2021   HGB 13.7 07/20/2021   HCT 40.5 07/20/2021   PLT 329 07/20/2021   GLUCOSE 147 (H) 07/20/2021   CHOL 227 (H) 07/20/2021   TRIG 122 07/20/2021   HDL 57 07/20/2021   LDLCALC 148 (H) 07/20/2021   ALT 42 (H) 07/20/2021   AST 29 07/20/2021   NA 144 07/20/2021   K 5.1  07/20/2021   CL 104 07/20/2021   CREATININE 0.88 07/20/2021   BUN 15 07/20/2021   CO2 26 07/20/2021   TSH 2.200 04/14/2017   HGBA1C 6.2 (A) 07/20/2021     Assessment & Plan:  Frances Martin was seen today for diabetes.  Diagnoses and all orders for this visit:  Essential hypertension Counseled on blood pressure goal of less than 130/80, low-sodium, DASH diet, medication compliance, 150 minutes of moderate intensity exercise per week. Discussed medication compliance, adverse effects.  -     CMP14+EGFR -     losartan (COZAAR) 50 MG tablet; TAKE 1 TABLET (50 MG TOTAL) BY MOUTH DAILY.  Type 2 diabetes mellitus without complication, without long-term current use of insulin (HCC) Well controlled does not want to change medication gives her room to breath ADA recommends the following therapeutic goals for glycemic control related to A1c measurements: Goal of therapy: is met of 6.5 hemoglobin A1c. Monitor foods that are high in carbohydrates are the following rice, potatoes, breads, sugars, and pastas.  Reduction in the intake (eating) will assist in lowering your blood sugars.  -     CBC with Differential -     CMP14+EGFR -     HgB A1c 6.2  -     Discontinue: metFORMIN (GLUCOPHAGE-XR) 500 MG 24 hr tablet; TAKE 1 TABLET (500 MG TOTAL) BY MOUTH 2 (TWO) TIMES DAILY AFTER A MEAL. -     metFORMIN (GLUCOPHAGE-XR) 500 MG 24 hr tablet; TAKE 1 TABLET (500 MG TOTAL) BY MOUTH 2 (TWO) TIMES DAILY AFTER A MEAL.  Hyperlipidemia associated with type 2 diabetes mellitus (HCC)  Healthy lifestyle diet of fruits vegetables fish nuts whole grains and low saturated fat . Foods high in cholesterol or liver, fatty meats,cheese, butter avocados, nuts and seeds, chocolate and fried foods. -     Lipid Panel  Gastroesophageal reflux disease with esophagitis without hemorrhage Discussed eating small frequent meal, reduction in acidic foods, fried foods ,spicy foods, alcohol caffeine and tobacco and certain medications. Avoid  laying down after eating 31mins-1hour, elevated head of the bed.  -     pantoprazole (PROTONIX) 40 MG tablet; Take 1 tablet (40 mg total) by mouth daily.  Chronic left shoulder pain -     celecoxib (CELEBREX) 200 MG capsule; Take 1 capsule (200 mg total) by mouth 2 (two) times daily.  Arthralgia, unspecified joint -     celecoxib (CELEBREX) 200 MG capsule; Take 1 capsule (200 mg total) by mouth 2 (two) times daily.  Stopped smoking Taking Wellbutrin  to stop smoking accomplished . Improved her mood and decrease anxiety asked to continue   Depression, unspecified depression type -     buPROPion (WELLBUTRIN SR) 150 MG 12 hr tablet; Take 1 tablet twice and day (morning and bedtime)  Loss of libido Discuss on f/u   Other orders/Medication refill -     hydrochlorothiazide (MICROZIDE) 12.5 MG capsule; Take 1 capsule (12.5 mg total) by mouth daily. -     Glucosamine 500 MG CAPS; Take 1 capsule (500 mg total) by mouth daily. -  Discontinue: pravastatin (PRAVACHOL) 10 MG tablet; Take 1 tablet (10 mg total) by mouth daily.   I have discontinued Frances Martin's pravastatin. I have also changed her buPROPion. Additionally, I am having her start on hydrochlorothiazide. Lastly, I am having her maintain her aspirin EC, pantoprazole, losartan, celecoxib, Glucosamine, and metFORMIN.  Meds ordered this encounter  Medications   pantoprazole (PROTONIX) 40 MG tablet    Sig: Take 1 tablet (40 mg total) by mouth daily.    Dispense:  30 tablet    Refill:  2   losartan (COZAAR) 50 MG tablet    Sig: TAKE 1 TABLET (50 MG TOTAL) BY MOUTH DAILY.    Dispense:  90 tablet    Refill:  3   celecoxib (CELEBREX) 200 MG capsule    Sig: Take 1 capsule (200 mg total) by mouth 2 (two) times daily.    Dispense:  180 capsule    Refill:  1   DISCONTD: metFORMIN (GLUCOPHAGE-XR) 500 MG 24 hr tablet    Sig: TAKE 1 TABLET (500 MG TOTAL) BY MOUTH 2 (TWO) TIMES DAILY AFTER A MEAL.    Dispense:  180 tablet     Refill:  1   hydrochlorothiazide (MICROZIDE) 12.5 MG capsule    Sig: Take 1 capsule (12.5 mg total) by mouth daily.    Dispense:  90 capsule    Refill:  1   Glucosamine 500 MG CAPS    Sig: Take 1 capsule (500 mg total) by mouth daily.    Dispense:  90 capsule    Refill:  1   metFORMIN (GLUCOPHAGE-XR) 500 MG 24 hr tablet    Sig: TAKE 1 TABLET (500 MG TOTAL) BY MOUTH 2 (TWO) TIMES DAILY AFTER A MEAL.    Dispense:  180 tablet    Refill:  1   DISCONTD: pravastatin (PRAVACHOL) 10 MG tablet    Sig: Take 1 tablet (10 mg total) by mouth daily.    Dispense:  90 tablet    Refill:  1   buPROPion (WELLBUTRIN SR) 150 MG 12 hr tablet    Sig: Take 1 tablet twice and day (morning and bedtime)    Dispense:  90 tablet    Refill:  1     Follow-up:   Return in about 5 weeks (around 08/24/2021) for Bp added medication.  The above assessment and management plan was discussed with the patient. The patient verbalized understanding of and has agreed to the management plan. Patient is aware to call the clinic if symptoms fail to improve or worsen. Patient is aware when to return to the clinic for a follow-up visit. Patient educated on when it is appropriate to go to the emergency department.   Juluis Mire, NP-C

## 2021-07-20 NOTE — Patient Instructions (Addendum)
Gripe en los adultos Influenza, Adult A la gripe tambin se la conoce como "influenza". Es una infeccin en los pulmones, la nariz y la garganta (vas respiratorias). Se transmite fcilmente de persona a persona (es contagiosa). La gripe causa sntomas que son como los de un resfro, junto con fiebre alta y dolores corporales. Cules son las causas? La causa de esta afeccin es el virus de la influenza. Puede contraer el virus de las siguientes maneras: Al inhalar gotitas que quedan en el aire despus de que una persona infectada con gripe tosi o estornud. Al tocar algo que est contaminado con el virus y luego llevarse la mano a la boca, la nariz o los ojos. Qu incrementa el riesgo? Hay ciertas cosas que lo pueden hacer ms propenso a tener gripe. Estas incluyen lo siguiente: No lavarse las manos con frecuencia. Tener contacto cercano con muchas personas durante la temporada de resfro y gripe. Tocarse la boca, los ojos o la nariz sin antes lavarse las manos. No recibir la vacuna antigripal todos los aos. Puede correr un mayor riesgo de tener gripe, y problemas graves, como una infeccin pulmonar (neumona), si usted: Es mayor de 65 aos de edad. Est embarazada. Tiene debilitado el sistema que combate las defensas (sistema inmunitario) debido a una enfermedad o a que toma determinados medicamentos. Tiene una afeccin a largo plazo (crnica), como las siguientes: Enfermedad cardaca, renal o pulmonar. Diabetes. Asma. Tiene un trastorno heptico. Tiene mucho sobrepeso (obesidad mrbida). Tiene anemia. Cules son los signos o sntomas? Los sntomas normalmente comienzan de repente y duran entre 4 y 14 das. Pueden incluir los siguientes: Fiebre y escalofros. Dolores de cabeza, dolores en el cuerpo o dolores musculares. Dolor de garganta. Tos. Secrecin o congestin nasal. Molestias en el pecho. No querer comer tanto como lo hace normalmente. Sensacin de debilidad o  cansancio. Mareos. Malestar estomacal o vmitos. Cmo se trata? Si la gripe se detecta de forma temprana, puede recibir tratamiento con medicamentos antivirales. Esto puede ayudar a reducir la gravedad y la duracin de la enfermedad. Se los administrarn por boca o a travs de un tubo (catter) intravenoso. Cuidarse en su hogar puede ayudar a que mejoren los sntomas. El mdico puede recomendarle lo siguiente: Tomar medicamentos de venta libre. Beber abundante lquido. La gripe suele desaparecer sola. Si tiene sntomas muy graves u otros problemas, puede recibir tratamiento en un hospital. Siga estas instrucciones en su casa:   Actividad Descanse todo lo que sea necesario. Duerma lo suficiente. Qudese en su casa y no concurra al trabajo o a la escuela, como se lo haya indicado el mdico. No salga de su casa hasta que no haya tenido fiebre por 24 horas sin tomar medicamentos. Salga de su casa solamente para ir al mdico. Comida y bebida Tome una SRO (solucin de rehidratacin oral). Es una bebida que se vende en farmacias y tiendas. Beba suficiente lquido como para mantener la orina de color amarillo plido. En la medida en que pueda, beba lquidos transparentes en pequeas cantidades. Los lquidos transparentes son, por ejemplo: Agua. Trocitos de hielo. Jugo de frutas mezclado con agua. Bebidas deportivas de bajas caloras. Coma alimentos suaves que sean fciles de digerir. En la medida que pueda, consuma pequeas cantidades. Estos alimentos incluyen: Bananas. Pur de manzana. Arroz. Carnes magras. Tostadas. Galletas. No coma ni beba lo siguiente: Lquidos con alto contenido de azcar o cafena. Alcohol. Alimentos condimentados o con alto contenido de grasa. Indicaciones generales Use los medicamentos de venta libre y los recetados solamente   como se lo haya indicado el mdico. Use un humidificador de aire fro para que el aire de su casa est ms hmedo. Esto puede facilitar  la respiracin. Cuando utilice un humidificador de vapor fro, lmpielo a diario. Vace el agua y [place] por agua limpia. Al toser o estornudar, cbrase la boca y la [place]. Lvese las manos frecuentemente con agua y [place] y durante al menos 20 segundos. Esto tambin es importante despus de toser o Engineering geologist. Si no dispone de [place] y [place], use desinfectante para manos con alcohol. Cumpla con todas las visitas de seguimiento. Cmo se previene?  Colquese la vacuna antigripal todos los [place]. Puede colocarse la vacuna contra la gripe a fines de verano, en otoo o en invierno. Pregntele al mdico cundo debe aplicarse la vacuna contra la gripe. Evite el contacto con personas que estn enfermas durante el otoo y el invierno. Es la temporada del resfro y Emergency planning/management officer. Comunquese con un mdico si: Tiene sntomas nuevos. Tiene los siguientes sntomas: Dolor de [place]. Materia fecal lquida (diarrea). Grant Ruts. La tos empeora. Empieza a tener ms mucosidad. Tiene Programme researcher, broadcasting/film/video. Vomita. Solicite ayuda de inmediato si: Le falta el aire. Tiene dificultad para respirar. La piel o las uas se ponen de un color azulado. Presenta dolor muy intenso o rigidez en el cuello. Tiene dolor de cabeza repentino. Le duele la cara o el odo de forma repentina. No puede comer ni beber sin vomitar. Estos sntomas pueden representar un problema grave que constituye Radio broadcast assistant. Solicite atencin mdica de inmediato. Comunquese con el servicio de emergencias de su localidad (911 en los [place]). No espere a ver si los sntomas desaparecen. No conduzca por sus propios medios [employer]. Resumen A la gripe tambin se la conoce como "influenza". Es una [employer] , la nariz y Administrator. Se transmite fcilmente de [place] persona a otra. Use los medicamentos de venta libre y los recetados solamente como se lo haya indicado el mdico. Aplicarse la vacuna contra la gripe todos los  aos es la mejor manera de no contagiarse la gripe. Esta informacin no tiene Theme park manager el consejo del mdico. Asegrese de hacerle al mdico cualquier pregunta que tenga. Document Revised: 05/28/2020 Document Reviewed: 05/28/2020 Elsevier Patient Education  2022 Elsevier Inc.  Por qu no debera tomar IBP a largo plazo? El uso a largo plazo de PPI (omeprazol, Protonix, Nexium) se ha asociado con un mayor riesgo de varios resultados adversos en estudios observacionales, que incluyen fracturas seas, vitamina B12, deficiencia de magnesio o hierro, infeccin por Clostridium difficile y neumona adquirida en la comunidad  Insomnio Insomnia El insomnio es un trastorno del sueo que causa dificultades para conciliar el sueo o para Network engineer. Puede producir fatiga, falta de energa, dificultad para concentrarse, cambios en el estado de nimo y mal rendimiento escolar o laboral. Hay tres formas diferentes de clasificar el insomnio: Dificultad para conciliar el sueo. Dificultad para mantener el sueo. Despertar muy precoz por la maana. Cualquier tipo de insomnio puede ser a Air cabin crew (crnico) o a Product manager (agudo). Ambos son frecuentes. Generalmente, el insomnio a corto plazo dura tres meses o menos tiempo. El crnico ocurre al menos tres veces por semana durante ms de tres meses. Cules son las causas? El insomnio puede deberse a otra afeccin, situacin o sustancia, por ejemplo: Ansiedad. Ciertos medicamentos. Enfermedad de reflujo gastroesofgico (ERGE) u otras enfermedades gastrointestinales. Asma y otras enfermedades respiratorias. Sndrome de las piernas inquietas, apnea del sueo u otros trastornos  del sueo. Dolor crnico. Menopausia. Accidente cerebrovascular. Consumo excesivo de alcohol, tabaco u drogas ilegales. Afecciones de salud mental, como depresin. Cafena. Trastornos neurolgicos, como enfermedad de Alzheimer. Hiperactividad de la glndula tiroidea  (hipertiroidismo). En ocasiones, la causa del insomnio puede ser desconocida. Qu incrementa el riesgo? Los factores de riesgo de tener insomnio incluyen lo siguiente: Sexo. La enfermedad afecta ms a menudo a las mujeres que a los hombres. La edad. El insomnio es ms frecuente a medida que una persona envejece. Estrs. Falta de actividad fsica. Los horarios de trabajo irregulares o los turnos nocturnos. Los viajes a lugares de diferentes zonas horarias. Ciertas afecciones mdicas y de salud mental. Cules son los signos o sntomas? Si tiene insomnio, el sntoma principal es la dificultad para conciliar el sueo o mantenerlo. Esto puede derivar en otros sntomas, por ejemplo: Sentirse fatigado o tener poca energa. Ponerse nervioso por [employer] irse a dormir. No sentirse descansado por la maana. Tener dificultad para concentrarse. Sentirse irritable, ansioso o deprimido. Cmo se diagnostica? Esta afeccin se puede diagnosticar en funcin de lo siguiente: Los sntomas y los antecedentes mdicos. El mdico puede hacerle preguntas sobre: Hbitos de sueo. Cualquier afeccin mdica que tenga. La salud mental. Un examen fsico. Cmo se trata? El tratamiento para el insomnio depende de la causa. El tratamiento puede centrarse en tratar Neomia Dear afeccin preexistente que causa el insomnio. El tratamiento tambin puede incluir lo siguiente: Medicamentos que lo ayuden a dormir. Asesoramiento psicolgico o terapia. Ajustes en el estilo de vida para ayudarlo a dormir mejor. Siga estas instrucciones en su casa: Comida y bebida  Limite o evite el consumo de alcohol, bebidas con cafena y cigarrillos, especialmente cerca de la hora de [employer], ya que pueden perturbarle el sueo. No consuma una comida suculenta ni coma alimentos condimentados justo antes de la hora de [place]. Esto puede causarle molestias digestivas y dificultades para dormir. Hbitos de sueo  Lleve un registro del  sueo ya que podra ser de utilidad para que usted y a su mdico puedan determinar qu podra estar causndole insomnio. Escriba los siguientes datos: Cundo duerme. Cundo se despierta durante la noche. Qu tan bien duerme. Qu tan relajado se siente al da siguiente. Cualquier efecto secundario de los medicamentos que toma. Lo que usted come y bebe. Convierta su habitacin en un lugar oscuro, cmodo donde sea fcil conciliar el sueo. Coloque persianas o cortinas oscuras que impidan la entrada de la luz del exterior. Para bloquear los ruidos, use un aparato que reproduzca sonidos ambientales o relajantes de fondo. Mantenga baja la temperatura. Limite el uso de pantallas antes de la hora de [place]. Esto incluye: Watching TV. Usar el telfono inteligente, la tableta o la computadora. Siga una rutina que incluya ir a dormir y Chiropodist a la misma hora cada da y noche. Esto puede ayudarlo a conciliar el sueo ms rpidamente. Considere realizar [employer] tranquila, como leer, e incorporarla como parte de la rutina a la hora de irse a dormir. Trate de evitar tomar siestas durante el da para que pueda dormir mejor por la noche. Levntese de la cama si sigue despierto despus de 15 minutos de haber intentado dormirse. Mantenga bajas las luces, pero intente leer o hacer una actividad tranquila. Cuando tenga sueo, regrese a Pharmacist, hospital. Instrucciones generales Use los medicamentos de venta libre y los recetados solamente como se lo haya indicado el mdico. Realice ejercicio con regularidad como se lo haya indicado el mdico. Evite la actividad fsica desde varias horas antes de  irse a dormir. Utilice tcnicas de relajacin para controlar el estrs. Pdale al mdico que le sugiera algunas tcnicas que sean adecuadas para usted. Pueden incluir: Ejercicios de respiracin. Rutinas para aliviar la tensin muscular. Visualizacin de escenas apacibles. Conduzca con cuidado. No conduzca si est muy  somnoliento. Concurra a todas las visitas de [address] se lo haya indicado el mdico. Esto es importante. Comunquese con un mdico si: Est cansado durante todo Medical laboratory scientific officer. Tiene dificultad en su rutina diaria debido a la somnolencia. Sigue teniendo problemas para dormir o Teacher, English as a foreign language. Solicite ayuda de inmediato si: Piensa seriamente en lastimarse a usted mismo o a Engineer, maintenance (IT). Si alguna vez siente que puede lastimarse o Physicist, medical a Economist, o tiene pensamientos de poner fin a su vida, busque ayuda de inmediato. Puede dirigirse al servicio de emergencias ms cercano o comunicarse con: Servicio de emergencias de su localidad (911 en [place] Una lnea de asistencia al suicida y atencin en crisis como National Suicide Prevention Lifeline (Lnea Nacional de Prevencin del Suicidio), al [phone]. Est disponible las 24 horas del da. Resumen El insomnio es un trastorno del sueo que causa dificultades para conciliar el sueo o para [place]. El insomnio puede ser a Air cabin crew (crnico) o a Product manager (agudo). El tratamiento para el insomnio depende de la causa. El tratamiento puede centrarse en tratar Neomia Dear afeccin preexistente que causa el insomnio. Lleve un registro del sueo ya que podra ser de utilidad para que usted y a su mdico puedan determinar qu podra estar causndole insomnio. Esta informacin no tiene Theme park manager el consejo del mdico. Asegrese de hacerle al mdico cualquier pregunta que tenga. Document Revised: 06/15/2020 Document Reviewed: 06/15/2020 Elsevier Patient Education  2022 [employer].

## 2021-07-21 ENCOUNTER — Other Ambulatory Visit: Payer: Self-pay

## 2021-07-21 ENCOUNTER — Other Ambulatory Visit (INDEPENDENT_AMBULATORY_CARE_PROVIDER_SITE_OTHER): Payer: Self-pay | Admitting: Primary Care

## 2021-07-21 LAB — CBC WITH DIFFERENTIAL/PLATELET
Basophils Absolute: 0.1 10*3/uL (ref 0.0–0.2)
Basos: 1 %
EOS (ABSOLUTE): 0.2 10*3/uL (ref 0.0–0.4)
Eos: 2 %
Hematocrit: 40.5 % (ref 34.0–46.6)
Hemoglobin: 13.7 g/dL (ref 11.1–15.9)
Immature Grans (Abs): 0 10*3/uL (ref 0.0–0.1)
Immature Granulocytes: 0 %
Lymphocytes Absolute: 2.9 10*3/uL (ref 0.7–3.1)
Lymphs: 36 %
MCH: 29.6 pg (ref 26.6–33.0)
MCHC: 33.8 g/dL (ref 31.5–35.7)
MCV: 88 fL (ref 79–97)
Monocytes Absolute: 0.5 10*3/uL (ref 0.1–0.9)
Monocytes: 7 %
Neutrophils Absolute: 4.3 10*3/uL (ref 1.4–7.0)
Neutrophils: 54 %
Platelets: 329 10*3/uL (ref 150–450)
RBC: 4.63 x10E6/uL (ref 3.77–5.28)
RDW: 13.8 % (ref 11.7–15.4)
WBC: 8 10*3/uL (ref 3.4–10.8)

## 2021-07-21 LAB — CMP14+EGFR
ALT: 42 IU/L — ABNORMAL HIGH (ref 0–32)
AST: 29 IU/L (ref 0–40)
Albumin/Globulin Ratio: 1.5 (ref 1.2–2.2)
Albumin: 4.5 g/dL (ref 3.8–4.9)
Alkaline Phosphatase: 93 IU/L (ref 44–121)
BUN/Creatinine Ratio: 17 (ref 9–23)
BUN: 15 mg/dL (ref 6–24)
Bilirubin Total: 0.3 mg/dL (ref 0.0–1.2)
CO2: 26 mmol/L (ref 20–29)
Calcium: 10.5 mg/dL — ABNORMAL HIGH (ref 8.7–10.2)
Chloride: 104 mmol/L (ref 96–106)
Creatinine, Ser: 0.88 mg/dL (ref 0.57–1.00)
Globulin, Total: 3 g/dL (ref 1.5–4.5)
Glucose: 147 mg/dL — ABNORMAL HIGH (ref 70–99)
Potassium: 5.1 mmol/L (ref 3.5–5.2)
Sodium: 144 mmol/L (ref 134–144)
Total Protein: 7.5 g/dL (ref 6.0–8.5)
eGFR: 76 mL/min/{1.73_m2} (ref >59)

## 2021-07-21 LAB — LIPID PANEL
Chol/HDL Ratio: 4 ratio (ref 0.0–4.4)
Cholesterol, Total: 227 mg/dL — ABNORMAL HIGH (ref 100–199)
HDL: 57 mg/dL (ref >39)
LDL Chol Calc (NIH): 148 mg/dL — ABNORMAL HIGH (ref 0–99)
Triglycerides: 122 mg/dL (ref 0–149)
VLDL Cholesterol Cal: 22 mg/dL (ref 5–40)

## 2021-07-21 MED ORDER — PRAVASTATIN SODIUM 40 MG PO TABS
40.0000 mg | ORAL_TABLET | Freq: Every day | ORAL | 1 refills | Status: DC
  Filled 2021-07-21: qty 30, 30d supply, fill #0

## 2021-07-28 ENCOUNTER — Other Ambulatory Visit: Payer: Self-pay

## 2021-08-20 ENCOUNTER — Other Ambulatory Visit (HOSPITAL_COMMUNITY): Payer: Self-pay

## 2021-08-24 ENCOUNTER — Encounter (INDEPENDENT_AMBULATORY_CARE_PROVIDER_SITE_OTHER): Payer: Self-pay | Admitting: Primary Care

## 2021-08-24 ENCOUNTER — Other Ambulatory Visit: Payer: Self-pay

## 2021-08-24 ENCOUNTER — Ambulatory Visit (INDEPENDENT_AMBULATORY_CARE_PROVIDER_SITE_OTHER): Payer: Self-pay | Admitting: Primary Care

## 2021-08-24 VITALS — BP 144/85 | HR 101 | Temp 97.7°F | Ht 62.0 in | Wt 195.0 lb

## 2021-08-24 DIAGNOSIS — M25512 Pain in left shoulder: Secondary | ICD-10-CM

## 2021-08-24 DIAGNOSIS — M2550 Pain in unspecified joint: Secondary | ICD-10-CM

## 2021-08-24 DIAGNOSIS — F5101 Primary insomnia: Secondary | ICD-10-CM

## 2021-08-24 DIAGNOSIS — G8929 Other chronic pain: Secondary | ICD-10-CM

## 2021-08-24 DIAGNOSIS — E119 Type 2 diabetes mellitus without complications: Secondary | ICD-10-CM

## 2021-08-24 DIAGNOSIS — F32A Depression, unspecified: Secondary | ICD-10-CM

## 2021-08-24 DIAGNOSIS — I10 Essential (primary) hypertension: Secondary | ICD-10-CM

## 2021-08-24 MED ORDER — BUPROPION HCL ER (SR) 150 MG PO TB12
ORAL_TABLET | ORAL | 1 refills | Status: DC
  Filled 2021-08-24: qty 90, fill #0

## 2021-08-24 MED ORDER — HYDROCHLOROTHIAZIDE 25 MG PO TABS
25.0000 mg | ORAL_TABLET | Freq: Every day | ORAL | 1 refills | Status: DC
  Filled 2021-08-24: qty 30, 30d supply, fill #0
  Filled 2021-10-04: qty 90, 90d supply, fill #0

## 2021-08-24 MED ORDER — PRAVASTATIN SODIUM 40 MG PO TABS
40.0000 mg | ORAL_TABLET | Freq: Every day | ORAL | 1 refills | Status: DC
  Filled 2021-08-24: qty 30, 30d supply, fill #0

## 2021-08-24 MED ORDER — METFORMIN HCL ER 500 MG PO TB24
ORAL_TABLET | ORAL | 1 refills | Status: DC
Start: 2021-08-24 — End: 2021-11-22
  Filled 2021-08-24: qty 180, fill #0

## 2021-08-24 MED ORDER — CELECOXIB 200 MG PO CAPS
200.0000 mg | ORAL_CAPSULE | Freq: Two times a day (BID) | ORAL | 1 refills | Status: DC
  Filled 2021-08-24: qty 180, 90d supply, fill #0

## 2021-08-24 NOTE — Progress Notes (Signed)
[facility]   [name] is a 61 y.o. Hispanic female Frances Martin [id number]) presents for hypertension evaluation, Denies shortness of breath, headaches, chest pain or lower extremity edema, sudden onset, vision changes, unilateral weakness, dizziness, paresthesias   Patient reports adherence with medications.  Dietary habits include: monitor carbs and sodium intake  Exercise habits include:yes  Family / Social history: mother and father - diabetes    Past Medical History:  Diagnosis Date   Hemorrhoids    Hyperlipidemia    Hypertension    Pre-diabetes    Past Surgical History:  Procedure Laterality Date   CESAREAN SECTION     3 previous   COLONOSCOPY WITH PROPOFOL N/A 11/14/2017   Procedure: COLONOSCOPY WITH PROPOFOL;  Surgeon: [name], MD;  Location: [facility] ENDOSCOPY;  Service: Gastroenterology;  Laterality: N/A;   COLONOSCOPY WITH PROPOFOL N/A 01/22/2018   Procedure: COLONOSCOPY WITH PROPOFOL;  Surgeon: [name], MD;  Location: [facility] ENDOSCOPY;  Service: Gastroenterology;  Laterality: N/A;   TUBAL LIGATION     No Known Allergies Current Outpatient Medications on File Prior to Visit  Medication Sig Dispense Refill   buPROPion (WELLBUTRIN SR) 150 MG 12 hr tablet Take 1 tablet twice a day (morning and bedtime) 90 tablet 1   celecoxib (CELEBREX) 200 MG capsule Take 1 capsule (200 mg total) by mouth 2 (two) times daily. 180 capsule 1   losartan (COZAAR) 50 MG tablet TAKE 1 TABLET (50 MG TOTAL) BY MOUTH DAILY. 90 tablet 3   metFORMIN (GLUCOPHAGE-XR) 500 MG 24 hr tablet TAKE 1 TABLET (500 MG TOTAL) BY MOUTH 2 (TWO) TIMES DAILY AFTER A MEAL. 180 tablet 1   pravastatin (PRAVACHOL) 40 MG tablet Take 1 tablet (40 mg total) by mouth daily. 90 tablet 1   aspirin EC 81 MG tablet Take 81 mg by mouth daily.     Glucosamine 500 MG CAPS Take 1 capsule (500 mg total) by mouth daily. 90 capsule 1   pantoprazole (PROTONIX) 40 MG tablet Take 1 tablet (40 mg total) by  mouth daily. 30 tablet 2   No current facility-administered medications on file prior to visit.   Social History   Socioeconomic History   Marital status: Married    Spouse name: Not on file   Number of children: 3   Years of education: Not on file   Highest education level: 10th grade  Occupational History   Not on file  Tobacco Use   Smoking status: Every Day    Packs/day: 0.50    Types: Cigarettes   Smokeless tobacco: Never   Tobacco comments:    3 a day  Vaping Use   Vaping Use: Never used  Substance and Sexual Activity   Alcohol use: No   Drug use: No   Sexual activity: Yes    Birth control/protection: Surgical  Other Topics Concern   Not on file  Social History Narrative   Not on file   Social Determinants of Health   Financial Resource Strain: Not on file  Food Insecurity: Not on file  Transportation Needs: Not on file  Physical Activity: Not on file  Stress: Not on file  Social Connections: Not on file  Intimate Partner Violence: Not on file   Family History  Problem Relation Age of Onset   Diabetes Mother    Hypertension Mother    Diabetes Father      OBJECTIVE:  Vitals:   08/24/21 0916  BP: (!) 144/85  Pulse: (!) 101  Temp:  97.7 F (36.5 C)  TempSrc: Temporal  SpO2: 96%  Weight: 195 lb (88.5 kg)  Height: 5\' 2"  (1.575 m)    Physical Exam Vitals reviewed.  Constitutional:      Appearance: She is obese.  HENT:     Head: Normocephalic.     Right Ear: Tympanic membrane and external ear normal.     Left Ear: Tympanic membrane and external ear normal.     Nose: Nose normal.  Eyes:     Extraocular Movements: Extraocular movements intact.     Pupils: Pupils are equal, round, and reactive to light.  Neck:     Comments: Thick  Cardiovascular:     Rate and Rhythm: Normal rate and regular rhythm.  Pulmonary:     Effort: Pulmonary effort is normal.     Breath sounds: Normal breath sounds.  Abdominal:     General: Bowel sounds are  normal. There is distension.     Palpations: Abdomen is soft.  Musculoskeletal:        General: Normal range of motion.     Cervical back: Normal range of motion and neck supple.  Skin:    General: Skin is warm and dry.  Neurological:     Mental Status: She is oriented to person, place, and time.  Psychiatric:        Mood and Affect: Mood normal.        Behavior: Behavior normal.        Thought Content: Thought content normal.        Judgment: Judgment normal.    Comprehensive ROS Pertinent positive and negative noted in HPI    Last 3 Office BP readings: BP Readings from Last 3 Encounters:  08/24/21 (!) 144/85  07/20/21 (!) 142/77  03/18/21 132/79    BMET    Component Value Date/Time   NA 144 07/20/2021 0846   K 5.1 07/20/2021 0846   CL 104 07/20/2021 0846   CO2 26 07/20/2021 0846   GLUCOSE 147 (H) 07/20/2021 0846   BUN 15 07/20/2021 0846   CREATININE 0.88 07/20/2021 0846   CALCIUM 10.5 (H) 07/20/2021 0846   GFRNONAA 86 11/13/2019 0857   GFRAA 99 11/13/2019 0857    Renal function: CrCl cannot be calculated (Patient's most recent lab result is older than the maximum 21 days allowed.).  Clinical ASCVD: Yes  The 10-year ASCVD risk score (Arnett DK, et al., 2019) is: 21.2%   Values used to calculate the score:     Age: [age] years     Sex: Female     Is Non-Hispanic African American: No     Diabetic: Yes     Tobacco smoker: Yes     Systolic Blood Pressure: 144 mmHg     Is BP treated: Yes     HDL Cholesterol: 57 mg/dL     Total Cholesterol: 227 mg/dL  ASCVD risk factors include- [place]   ASSESSMENT & PLAN:   Frances Martin was seen today for blood pressure check.  Diagnoses and all orders for this visit:  Essential hypertension -Counseled on lifestyle modifications for blood pressure control including reduced dietary sodium, increased exercise, weight reduction and adequate sleep. Also, educated patient about the risk for cardiovascular events, stroke and heart attack.  Also counseled patient about the importance of medication adherence. If you participate in smoking, it is important to stop using tobacco as this will increase the risks associated with uncontrolled blood pressure.   -Hypertension longstanding diagnosed currently HCTZ 12.5 mg and Losartan  50 mg  on current medications. Patient is adherent with current medications. Increased   Increased  (HYDRODIURIL) 25 MG tablet; Take 1 tablet (25 mg total) by mouth daily. Patient to take 2 tablets until current bottle is complete then take 1 tablet in the AM. Verbalized understanding  Goal BP:  For patients younger than 60: Goal BP < 130/80. For patients 60 and older: Goal BP < 140/90. For patients with diabetes: Goal BP < 130/80. Your most recent BP: 144/85  Minimize salt intake. Minimize alcohol intake  Depression, unspecified depression type Patient is very please with the affects of the medication she has stopped smoking, in a calmer state and resting better.  -     buPROPion (WELLBUTRIN SR) 150 MG 12 hr tablet; Take 1 tablet twice a day (morning and bedtime)  Chronic left shoulder pain -     celecoxib (CELEBREX) 200 MG capsule; Take 1 capsule (200 mg total) by mouth 2 (two) times daily.  Arthralgia, unspecified joint -     celecoxib (CELEBREX) 200 MG capsule; Take 1 capsule (200 mg total) by mouth 2 (two) times daily.  Type 2 diabetes mellitus without complication, without long-term current use of insulin (HCC) Continue to monitor foods that are high in carbohydrates are the following rice, potatoes, breads, sugars, and pastas.  Reduction in the intake (eating) will assist in lowering your blood sugars.  -     metFORMIN (GLUCOPHAGE-XR) 500 MG 24 hr tablet; TAKE 1 TABLET (500 MG TOTAL) BY MOUTH 2 (TWO) TIMES DAILY AFTER A MEAL. -     pravastatin (PRAVACHOL) 40 MG tablet; Take 1 tablet (40 mg total) by mouth daily.  Primary insomnia -     buPROPion (WELLBUTRIN SR) 150 MG 12 hr tablet; Take 1  tablet twice a day (morning and bedtime)      This note has been created with Education officer, environmental. Any transcriptional errors are unintentional.   Grayce Sessions, NP 08/24/2021, 9:26 AM

## 2021-08-26 ENCOUNTER — Telehealth: Payer: Self-pay | Admitting: Primary Care

## 2021-08-26 NOTE — Telephone Encounter (Signed)
Copied from [place] [id number]. Topic: General - Other >> Jun 14, 2021 10:12 AM Leward Quan A wrote: Reason for CRM: Patient called in asking for Clifton James to please give her a call back at Ph# [phone] Pt already apply

## 2021-08-30 ENCOUNTER — Other Ambulatory Visit: Payer: Self-pay

## 2021-10-04 ENCOUNTER — Other Ambulatory Visit (HOSPITAL_COMMUNITY): Payer: Self-pay

## 2021-10-07 ENCOUNTER — Telehealth: Payer: Self-pay

## 2021-10-07 NOTE — Telephone Encounter (Signed)
This CM spoke to [facility] Rolling/[employer] - CPAP Assistance Program.  She explained that they have a CPAP machine ready for patient and have contacted her but have not heard back from her.  Call placed to patient # [phone] with assistance of Spanish Interpreter # [id number]/[employer] to inform patient about the status of CPAP order and inquire if she is able to make payment If she is not able to pay the $100 program fee, [facility] has funding to pay the cost for her.  Message left with call back requested to this CM.

## 2021-10-11 NOTE — Telephone Encounter (Signed)
Call placed to patient # [phone] with assistance of Spanish Interpreter # [id number]/[employer] to inform patient about the status of CPAP order and inquire if she is able to make payment If she is not able to pay the $100 program fee, [facility] has funding to pay the cost for her.  Message left with call back requested to this CM

## 2021-11-22 ENCOUNTER — Encounter (INDEPENDENT_AMBULATORY_CARE_PROVIDER_SITE_OTHER): Payer: Self-pay | Admitting: Primary Care

## 2021-11-22 ENCOUNTER — Ambulatory Visit (INDEPENDENT_AMBULATORY_CARE_PROVIDER_SITE_OTHER): Payer: Self-pay | Admitting: Primary Care

## 2021-11-22 ENCOUNTER — Other Ambulatory Visit: Payer: Self-pay

## 2021-11-22 VITALS — BP 126/81 | HR 100 | Temp 98.0°F | Ht 62.0 in | Wt 192.6 lb

## 2021-11-22 DIAGNOSIS — E119 Type 2 diabetes mellitus without complications: Secondary | ICD-10-CM

## 2021-11-22 DIAGNOSIS — Z760 Encounter for issue of repeat prescription: Secondary | ICD-10-CM

## 2021-11-22 DIAGNOSIS — F32A Depression, unspecified: Secondary | ICD-10-CM

## 2021-11-22 DIAGNOSIS — F5101 Primary insomnia: Secondary | ICD-10-CM

## 2021-11-22 DIAGNOSIS — G8929 Other chronic pain: Secondary | ICD-10-CM

## 2021-11-22 DIAGNOSIS — I10 Essential (primary) hypertension: Secondary | ICD-10-CM

## 2021-11-22 DIAGNOSIS — K2100 Gastro-esophageal reflux disease with esophagitis, without bleeding: Secondary | ICD-10-CM

## 2021-11-22 DIAGNOSIS — M2550 Pain in unspecified joint: Secondary | ICD-10-CM

## 2021-11-22 DIAGNOSIS — M25512 Pain in left shoulder: Secondary | ICD-10-CM

## 2021-11-22 LAB — POCT GLYCOSYLATED HEMOGLOBIN (HGB A1C): Hemoglobin A1C: 6.6 % — AB (ref 4.0–5.6)

## 2021-11-22 MED ORDER — METFORMIN HCL ER 500 MG PO TB24
ORAL_TABLET | ORAL | 1 refills | Status: DC
  Filled 2021-11-22: qty 180, 90d supply, fill #0

## 2021-11-22 MED ORDER — CELECOXIB 200 MG PO CAPS
200.0000 mg | ORAL_CAPSULE | Freq: Two times a day (BID) | ORAL | 1 refills | Status: DC
  Filled 2021-11-22: qty 180, 90d supply, fill #0
  Filled 2022-02-07 (×2): qty 180, 90d supply, fill #1

## 2021-11-22 MED ORDER — GLUCOSAMINE 500 MG PO CAPS
500.0000 mg | ORAL_CAPSULE | Freq: Every day | ORAL | 1 refills | Status: DC
Start: 2021-11-22 — End: 2023-07-21
  Filled 2021-11-22 – 2022-03-29 (×2): qty 90, 90d supply, fill #0

## 2021-11-22 MED ORDER — PRAVASTATIN SODIUM 40 MG PO TABS
40.0000 mg | ORAL_TABLET | Freq: Every day | ORAL | 1 refills | Status: DC
  Filled 2021-11-22: qty 90, 90d supply, fill #0

## 2021-11-22 MED ORDER — PANTOPRAZOLE SODIUM 40 MG PO TBEC
40.0000 mg | DELAYED_RELEASE_TABLET | Freq: Every day | ORAL | 2 refills | Status: DC
  Filled 2021-11-22: qty 30, 30d supply, fill #0

## 2021-11-22 MED ORDER — BUPROPION HCL ER (SR) 150 MG PO TB12
ORAL_TABLET | ORAL | 1 refills | Status: DC
  Filled 2021-11-22: qty 180, 90d supply, fill #0

## 2021-11-22 MED ORDER — HYDROCHLOROTHIAZIDE 25 MG PO TABS
25.0000 mg | ORAL_TABLET | Freq: Every day | ORAL | 1 refills | Status: DC
  Filled 2021-11-22 – 2021-12-24 (×2): qty 90, 90d supply, fill #0

## 2021-11-22 MED ORDER — LOSARTAN POTASSIUM 50 MG PO TABS
ORAL_TABLET | Freq: Every day | ORAL | 3 refills | Status: DC
  Filled 2021-11-22: qty 90, 90d supply, fill #0

## 2021-11-22 NOTE — Progress Notes (Signed)
? ?Subjective:  ?Patient ID: Frances BullsSusana Martin, female    DOB: 06-19-1961  Age: 61 y.o. MRN: [id number] ? ?CC: Diabetes and Hypertension ? ? ?HPI ?Frances BullsSusana Martin presents forFollow-up of diabetes. Patient does not check blood sugar at home. HTN - unremarkable - Denies shortness of breath, headaches, chest pain or lower extremity edema  ?[facility](Interpreter Monica [phone]) ?Compliant with meds - Yes ?Checking CBGs? No ? Fasting avg -  ? Postprandial average -  ?Exercising regularly? - Yes ?Watching carbohydrate intake? - Yes ?Neuropathy ? - No ?Hypoglycemic events - No ? - Recovers with :  ? ?Pertinent ROS:  ?Polyuria - No ?Polydipsia - No ?Vision problems - No ? ?Medications as noted below. Taking them regularly without complication/adverse reaction being reported today.  ? ?History ?Frances SoleSusana has a past medical history of Hemorrhoids, Hyperlipidemia, Hypertension, and Pre-diabetes.  ? ?She has a past surgical history that includes Tubal ligation; Colonoscopy with propofol (N/A, 11/14/2017); Colonoscopy with propofol (N/A, 01/22/2018); and Cesarean section.  ? ?Her family history includes Diabetes in her father and mother; Hypertension in her mother.She reports that she has been smoking cigarettes. She has been smoking an average of .5 packs per day. She has never used smokeless tobacco. She reports that she does not drink alcohol and does not use drugs. ? ?Current Outpatient Medications on File Prior to Visit  ?Medication Sig Dispense Refill  ? aspirin EC 81 MG tablet Take 81 mg by mouth daily.    ? buPROPion (WELLBUTRIN SR) 150 MG 12 hr tablet Take 1 tablet twice a day (morning and bedtime) 90 tablet 1  ? celecoxib (CELEBREX) 200 MG capsule Take 1 capsule (200 mg total) by mouth 2 (two) times daily. 180 capsule 1  ? Glucosamine 500 MG CAPS Take 1 capsule (500 mg total) by mouth daily. 90 capsule 1  ? hydrochlorothiazide (HYDRODIURIL) 25 MG tablet Take 1 tablet (25 mg total) by mouth daily. 90 tablet 1  ? losartan  (COZAAR) 50 MG tablet TAKE 1 TABLET (50 MG TOTAL) BY MOUTH DAILY. 90 tablet 3  ? metFORMIN (GLUCOPHAGE-XR) 500 MG 24 hr tablet TAKE 1 TABLET (500 MG TOTAL) BY MOUTH 2 (TWO) TIMES DAILY AFTER A MEAL. 180 tablet 1  ? pantoprazole (PROTONIX) 40 MG tablet Take 1 tablet (40 mg total) by mouth daily. 30 tablet 2  ? pravastatin (PRAVACHOL) 40 MG tablet Take 1 tablet (40 mg total) by mouth daily. 90 tablet 1  ? ?No current facility-administered medications on file prior to visit.  ? ? ?ROS ?Comprehensive ROS Pertinent positive and negative noted in HPI   ? ?Objective:  ?BP 126/81 (BP Location: Right Arm, Patient Position: Sitting, Cuff Size: Normal)   Pulse 100   Temp 98 ?F (36.7 ?C) (Oral)   Ht 5\' 2"  (1.575 m)   Wt 192 lb 9.6 oz (87.4 kg)   SpO2 93%   BMI 35.23 kg/m?  ? ?BP Readings from Last 3 Encounters:  ?11/22/21 126/81  ?08/24/21 (!) 144/85  ?07/20/21 (!) 142/77  ? ? ?Wt Readings from Last 3 Encounters:  ?11/22/21 192 lb 9.6 oz (87.4 kg)  ?08/24/21 195 lb (88.5 kg)  ?07/20/21 257 lb 9.6 oz (116.8 kg)  ? ? ?Physical Exam ?Vitals reviewed.  ?Constitutional:   ?   Appearance: She is obese.  ?HENT:  ?   Right Ear: Tympanic membrane and external ear normal.  ?   Left Ear: Tympanic membrane and external ear normal.  ?   Nose: Nose normal.  ?Eyes:  ?  Extraocular Movements: Extraocular movements intact.  ?   Conjunctiva/sclera: Conjunctivae normal.  ?   Pupils: Pupils are equal, round, and reactive to light.  ?Cardiovascular:  ?   Rate and Rhythm: Normal rate and regular rhythm.  ?Pulmonary:  ?   Effort: Pulmonary effort is normal.  ?   Breath sounds: Normal breath sounds.  ?Abdominal:  ?   General: Bowel sounds are normal. There is distension.  ?   Palpations: Abdomen is soft.  ?Musculoskeletal:     ?   General: Normal range of motion.  ?   Cervical back: Normal range of motion and neck supple.  ?Skin: ?   General: Skin is warm and dry.  ?Neurological:  ?   Mental Status: She is alert and oriented to person, place,  and time.  ?Psychiatric:     ?   Mood and Affect: Mood normal.     ?   Behavior: Behavior normal.     ?   Thought Content: Thought content normal.  ? ?Lab Results  ?Component Value Date  ? HGBA1C 6.6 (A) 11/22/2021  ? HGBA1C 6.2 (A) 07/20/2021  ? HGBA1C 6.6 (A) 01/28/2021  ? ? ?Lab Results  ?Component Value Date  ? WBC 8.0 07/20/2021  ? HGB 13.7 07/20/2021  ? HCT 40.5 07/20/2021  ? PLT 329 07/20/2021  ? GLUCOSE 147 (H) 07/20/2021  ? CHOL 227 (H) 07/20/2021  ? TRIG 122 07/20/2021  ? HDL 57 07/20/2021  ? LDLCALC 148 (H) 07/20/2021  ? ALT 42 (H) 07/20/2021  ? AST 29 07/20/2021  ? NA 144 07/20/2021  ? K 5.1 07/20/2021  ? CL 104 07/20/2021  ? CREATININE 0.88 07/20/2021  ? BUN 15 07/20/2021  ? CO2 26 07/20/2021  ? TSH 2.200 04/14/2017  ? HGBA1C 6.6 (A) 11/22/2021  ? ? ? ?Assessment & Plan:  ?Frances Martin was seen today for diabetes and hypertension. ? ?Diagnoses and all orders for this visit: ? ?Type 2 diabetes mellitus without complication, without long-term current use of insulin (HCC) ?Continue to monitor foods that are high in carbohydrates are the following rice, potatoes, breads, sugars, and pastas. ( Agreement only 2 tortillas daily) Reduction in the intake (eating) will assist in lowering your blood sugars.  ?-     HgB A1c 6.6  ?-     metFORMIN (GLUCOPHAGE-XR) 500 MG 24 hr tablet; TAKE 1 TABLET (500 MG TOTAL) BY MOUTH 2 (TWO) TIMES DAILY AFTER A MEAL. ?-     pravastatin (PRAVACHOL) 40 MG tablet; Take 1 tablet (40 mg total) by mouth daily. ? ?Medication refill ?-     buPROPion (WELLBUTRIN SR) 150 MG 12 hr tablet; Take 1 tablet twice a day (morning and bedtime) ?-     celecoxib (CELEBREX) 200 MG capsule; Take 1 capsule (200 mg total) by mouth 2 (two) times daily. ?-     Glucosamine 500 MG CAPS; Take 1 capsule (500 mg total) by mouth daily. ?-     hydrochlorothiazide (HYDRODIURIL) 25 MG tablet; Take 1 tablet (25 mg total) by mouth daily. ?-     losartan (COZAAR) 50 MG tablet; TAKE 1 TABLET (50 MG TOTAL) BY MOUTH DAILY. ?-      metFORMIN (GLUCOPHAGE-XR) 500 MG 24 hr tablet; TAKE 1 TABLET (500 MG TOTAL) BY MOUTH 2 (TWO) TIMES DAILY AFTER A MEAL. ?-     pravastatin (PRAVACHOL) 40 MG tablet; Take 1 tablet (40 mg total) by mouth daily. ? ?Essential hypertension ?Blood pressure goal of less than 130/80, low-sodium,  DASH diet, medication compliance, 150 minutes of moderate intensity exercise per week. ?Discussed medication compliance, adverse effects.  ?-     hydrochlorothiazide (HYDRODIURIL) 25 MG tablet; Take 1 tablet (25 mg total) by mouth daily. ?-     losartan (COZAAR) 50 MG tablet; TAKE 1 TABLET (50 MG TOTAL) BY MOUTH DAILY. ? ?Gastroesophageal reflux disease with esophagitis without hemorrhage ?-     pantoprazole (PROTONIX) 40 MG tablet; Take 1 tablet (40 mg total) by mouth daily. ? ?Depression, unspecified depression type ?-     buPROPion (WELLBUTRIN SR) 150 MG 12 hr tablet; Take 1 tablet twice a day (morning and bedtime) ? ?Primary insomnia ?-     buPROPion (WELLBUTRIN SR) 150 MG 12 hr tablet; Take 1 tablet twice a day (morning and bedtime) ? ?Chronic left shoulder pain ?-     celecoxib (CELEBREX) 200 MG capsule; Take 1 capsule (200 mg total) by mouth 2 (two) times daily. ? ?Arthralgia, unspecified joint ?-     celecoxib (CELEBREX) 200 MG capsule; Take 1 capsule (200 mg total) by mouth 2 (two) times daily. ? ?   ?I am having Frances Martin "Frances Martin" maintain her aspirin EC, pantoprazole, losartan, Glucosamine, hydrochlorothiazide, buPROPion, celecoxib, metFORMIN, and pravastatin. ? ?No orders of the defined types were placed in this encounter. ? ? ? ?Follow-up:  ? ?No follow-ups on file. ? ?The above assessment and management plan was discussed with the patient. The patient verbalized understanding of and has agreed to the management plan. Patient is aware to call the clinic if symptoms fail to improve or worsen. Patient is aware when to return to the clinic for a follow-up visit. Patient educated on when it is  appropriate to go to the emergency department.  ? ?Gwinda Passe, NP-C ? ?  ?

## 2021-12-24 ENCOUNTER — Other Ambulatory Visit (HOSPITAL_COMMUNITY): Payer: Self-pay

## 2022-01-04 ENCOUNTER — Other Ambulatory Visit (HOSPITAL_COMMUNITY): Payer: Self-pay

## 2022-02-07 ENCOUNTER — Other Ambulatory Visit (INDEPENDENT_AMBULATORY_CARE_PROVIDER_SITE_OTHER): Payer: Self-pay | Admitting: Primary Care

## 2022-02-07 ENCOUNTER — Other Ambulatory Visit: Payer: Self-pay

## 2022-02-07 ENCOUNTER — Other Ambulatory Visit (HOSPITAL_COMMUNITY): Payer: Self-pay

## 2022-02-07 DIAGNOSIS — F5101 Primary insomnia: Secondary | ICD-10-CM

## 2022-02-07 DIAGNOSIS — Z760 Encounter for issue of repeat prescription: Secondary | ICD-10-CM

## 2022-02-07 DIAGNOSIS — F32A Depression, unspecified: Secondary | ICD-10-CM

## 2022-02-07 MED ORDER — BUPROPION HCL ER (SR) 150 MG PO TB12
ORAL_TABLET | ORAL | 1 refills | Status: DC
  Filled 2022-02-07: qty 180, 90d supply, fill #0

## 2022-02-07 NOTE — Telephone Encounter (Signed)
Routed to PCP 

## 2022-02-07 NOTE — Telephone Encounter (Signed)
Routed request to PCP ?

## 2022-02-11 ENCOUNTER — Other Ambulatory Visit: Payer: Self-pay

## 2022-02-25 ENCOUNTER — Encounter (INDEPENDENT_AMBULATORY_CARE_PROVIDER_SITE_OTHER): Payer: Self-pay | Admitting: Primary Care

## 2022-02-25 ENCOUNTER — Other Ambulatory Visit: Payer: Self-pay

## 2022-02-25 ENCOUNTER — Ambulatory Visit (INDEPENDENT_AMBULATORY_CARE_PROVIDER_SITE_OTHER): Payer: Self-pay | Admitting: Primary Care

## 2022-02-25 ENCOUNTER — Other Ambulatory Visit (HOSPITAL_COMMUNITY)
Admission: RE | Admit: 2022-02-25 | Discharge: 2022-02-25 | Disposition: A | Discharge status: Home / Self Care | Payer: Self-pay | Source: Ambulatory Visit | Attending: Primary Care | Admitting: Primary Care

## 2022-02-25 VITALS — BP 142/86 | HR 83 | Temp 97.9°F | Ht 62.0 in | Wt 193.0 lb

## 2022-02-25 DIAGNOSIS — E119 Type 2 diabetes mellitus without complications: Secondary | ICD-10-CM

## 2022-02-25 DIAGNOSIS — F32A Depression, unspecified: Secondary | ICD-10-CM

## 2022-02-25 DIAGNOSIS — N898 Other specified noninflammatory disorders of vagina: Secondary | ICD-10-CM | POA: Insufficient documentation

## 2022-02-25 DIAGNOSIS — M25512 Pain in left shoulder: Secondary | ICD-10-CM

## 2022-02-25 DIAGNOSIS — K140 Glossitis: Secondary | ICD-10-CM

## 2022-02-25 DIAGNOSIS — Z760 Encounter for issue of repeat prescription: Secondary | ICD-10-CM

## 2022-02-25 DIAGNOSIS — M2550 Pain in unspecified joint: Secondary | ICD-10-CM

## 2022-02-25 DIAGNOSIS — K2100 Gastro-esophageal reflux disease with esophagitis, without bleeding: Secondary | ICD-10-CM

## 2022-02-25 DIAGNOSIS — E1169 Type 2 diabetes mellitus with other specified complication: Secondary | ICD-10-CM

## 2022-02-25 DIAGNOSIS — I10 Essential (primary) hypertension: Secondary | ICD-10-CM

## 2022-02-25 DIAGNOSIS — B370 Candidal stomatitis: Secondary | ICD-10-CM

## 2022-02-25 DIAGNOSIS — G8929 Other chronic pain: Secondary | ICD-10-CM

## 2022-02-25 DIAGNOSIS — F5101 Primary insomnia: Secondary | ICD-10-CM

## 2022-02-25 DIAGNOSIS — E785 Hyperlipidemia, unspecified: Secondary | ICD-10-CM

## 2022-02-25 LAB — POCT GLYCOSYLATED HEMOGLOBIN (HGB A1C): Hemoglobin A1C: 6.7 % — AB (ref 4.0–5.6)

## 2022-02-25 MED ORDER — NYSTATIN 100000 UNIT/ML MT SUSP
5.0000 mL | Freq: Four times a day (QID) | OROMUCOSAL | 0 refills | Status: DC
  Filled 2022-02-25: qty 60, 3d supply, fill #0

## 2022-02-25 MED ORDER — LOSARTAN POTASSIUM 50 MG PO TABS
ORAL_TABLET | Freq: Every day | ORAL | 3 refills | Status: DC
  Filled 2022-02-25: qty 90, 90d supply, fill #0
  Filled 2022-05-26: qty 30, 30d supply, fill #1
  Filled 2022-07-22: qty 90, 90d supply, fill #2

## 2022-02-25 MED ORDER — METFORMIN HCL ER 500 MG PO TB24
ORAL_TABLET | ORAL | 1 refills | Status: DC
Start: 2022-02-25 — End: 2022-09-19
  Filled 2022-02-25: qty 180, 90d supply, fill #0
  Filled 2022-05-26: qty 60, 30d supply, fill #1
  Filled 2022-07-22: qty 120, 60d supply, fill #2

## 2022-02-25 MED ORDER — HYDROCHLOROTHIAZIDE 25 MG PO TABS
25.0000 mg | ORAL_TABLET | Freq: Every day | ORAL | 1 refills | Status: DC
  Filled 2022-02-25 – 2022-03-29 (×2): qty 90, 90d supply, fill #0
  Filled 2022-07-22: qty 90, 90d supply, fill #1

## 2022-02-25 MED ORDER — CELECOXIB 200 MG PO CAPS
200.0000 mg | ORAL_CAPSULE | Freq: Two times a day (BID) | ORAL | 1 refills | Status: DC
  Filled 2022-02-25: qty 180, 90d supply, fill #0
  Filled 2022-05-26: qty 60, 30d supply, fill #0
  Filled 2022-07-22: qty 60, 30d supply, fill #1
  Filled 2022-11-16: qty 60, 30d supply, fill #2
  Filled 2022-12-19: qty 60, 30d supply, fill #3
  Filled 2023-01-18: qty 60, 30d supply, fill #4
  Filled 2023-02-12: qty 60, 30d supply, fill #5
  Filled ????-??-??: fill #2

## 2022-02-25 MED ORDER — BUPROPION HCL ER (SR) 150 MG PO TB12
ORAL_TABLET | ORAL | 1 refills | Status: DC
  Filled 2022-02-25: qty 60, 30d supply, fill #0
  Filled 2022-03-29: qty 60, 30d supply, fill #1
  Filled 2022-05-26: qty 60, 30d supply, fill #2

## 2022-02-25 MED ORDER — PANTOPRAZOLE SODIUM 40 MG PO TBEC
40.0000 mg | DELAYED_RELEASE_TABLET | Freq: Every day | ORAL | 2 refills | Status: DC
  Filled 2022-02-25: qty 90, 90d supply, fill #0

## 2022-02-25 NOTE — Progress Notes (Signed)
Subjective:  [name], female    DOB: 1960-09-29  Age: 61 y.o. MRN: [id number]  CC: Follow-up (DM/HTN) Interpreter Trena Platt  [phone]   HPI Frances Martin presents forFollow-up of diabetes. Patient does not check blood sugar at home  Compliant with meds - Yes Checking CBGs? No  Fasting avg -   Postprandial average -  Exercising regularly? - Yes Watching carbohydrate intake? - Yes Neuropathy ? - No Hypoglycemic events - No  - Recovers with :   Pertinent ROS:  Polyuria - No Polydipsia - No Vision problems - No Frances Martin is also managed for HTN- Frances Martin has not taken any medication at this time -Fasting. Patient has No headache, No chest pain, No abdominal pain - No Nausea, No new weakness tingling or numbness, No Cough - shortness of breath  Medications as noted below. Taking them regularly without complication/adverse reaction being reported today.   History Frances Martin has a past medical history of Hemorrhoids, Hyperlipidemia, Hypertension, and Pre-diabetes.   Frances Martin has a past surgical history that includes Tubal ligation; Colonoscopy with propofol (N/A, 11/14/2017); Colonoscopy with propofol (N/A, 01/22/2018); and Cesarean section.   Her family history includes Diabetes in her father and mother; Hypertension in her mother.Frances Martin reports that Frances Martin has been smoking cigarettes. Frances Martin has been smoking an average of .5 packs per day. Frances Martin has never used smokeless tobacco. Frances Martin reports that Frances Martin does not drink alcohol and does not use drugs.  Current Outpatient Medications on File Prior to Visit  Medication Sig Dispense Refill   aspirin EC 81 MG tablet Take 81 mg by mouth daily.     Glucosamine 500 MG CAPS Take 1 capsule (500 mg total) by mouth daily. 90 capsule 1   pravastatin (PRAVACHOL) 40 MG tablet Take 1 tablet (40 mg total) by mouth daily. 90 tablet 1   No current facility-administered medications on file prior to visit.    ROS Comprehensive ROS Pertinent positive and  negative noted in HPI    Objective:  BP (!) 142/86   Pulse 83   Temp 97.9 F (36.6 C) (Oral)   Ht 5\' 2"  (1.575 m)   Wt 193 lb (87.5 kg)   SpO2 94%   BMI 35.30 kg/m   BP Readings from Last 3 Encounters:  02/25/22 (!) 142/86  11/22/21 126/81  08/24/21 (!) 144/85    Wt Readings from Last 3 Encounters:  02/25/22 193 lb (87.5 kg)  11/22/21 192 lb 9.6 oz (87.4 kg)  08/24/21 195 lb (88.5 kg)    Physical Exam Vitals reviewed.  Constitutional:      Appearance: Frances Martin is obese.  HENT:     Head: Normocephalic.     Comments: Swollen taste buds and thrush    Right Ear: Tympanic membrane and external ear normal.     Left Ear: Tympanic membrane and external ear normal.     Nose: Nose normal.  Eyes:     Extraocular Movements: Extraocular movements intact.     Pupils: Pupils are equal, round, and reactive to light.  Cardiovascular:     Rate and Rhythm: Normal rate and regular rhythm.  Pulmonary:     Effort: Pulmonary effort is normal.     Breath sounds: Normal breath sounds.  Abdominal:     General: Bowel sounds are normal. There is distension.     Palpations: Abdomen is soft.  Musculoskeletal:        General: Normal range of motion.     Cervical back: Normal range of motion  and neck supple.  Skin:    General: Skin is warm and dry.  Neurological:     Mental Status: Frances Martin is alert and oriented to person, place, and time.  Psychiatric:        Mood and Affect: Mood normal.        Behavior: Behavior normal.        Thought Content: Thought content normal.        Judgment: Judgment normal.     Lab Results  Component Value Date   HGBA1C 6.7 (A) 02/25/2022   HGBA1C 6.6 (A) 11/22/2021   HGBA1C 6.2 (A) 07/20/2021    Lab Results  Component Value Date   WBC 8.0 07/20/2021   HGB 13.7 07/20/2021   HCT 40.5 07/20/2021   PLT 329 07/20/2021   GLUCOSE 147 (H) 07/20/2021   CHOL 227 (H) 07/20/2021   TRIG 122 07/20/2021   HDL 57 07/20/2021   LDLCALC 148 (H) 07/20/2021   ALT 42 (H)  07/20/2021   AST 29 07/20/2021   NA 144 07/20/2021   K 5.1 07/20/2021   CL 104 07/20/2021   CREATININE 0.88 07/20/2021   BUN 15 07/20/2021   CO2 26 07/20/2021   TSH 2.200 04/14/2017   HGBA1C 6.7 (A) 02/25/2022     Assessment & Plan:   Frances Martin was seen today for follow-up.  Diagnoses and all orders for this visit:  Type 2 diabetes mellitus without complication, without long-term current use of insulin (HCC) -     HgB A1c 6.7.  Continue foods that are high in carbohydrates are the following rice, potatoes, breads, sugars, and pastas.  Reduction in the intake (eating) will assist in lowering your blood sugars.  -     metFORMIN (GLUCOPHAGE-XR) 500 MG 24 hr tablet; TAKE 1 TABLET (500 MG TOTAL) BY MOUTH 2 (TWO) TIMES DAILY AFTER A MEAL. -     CBC with Differential/Platelet  Vaginal discharge -     Cervicovaginal ancillary only  Thrush, oral -     nystatin (MYCOSTATIN) 100000 UNIT/ML suspension; Take 5 mLs (500,000 Units total) by mouth 4 (four) times daily.  Gastroesophageal reflux disease with esophagitis without hemorrhage -     pantoprazole (PROTONIX) 40 MG tablet; Take 1 tablet (40 mg total) by mouth daily.  Medication refill -     metFORMIN (GLUCOPHAGE-XR) 500 MG 24 hr tablet; TAKE 1 TABLET (500 MG TOTAL) BY MOUTH 2 (TWO) TIMES DAILY AFTER A MEAL. -     losartan (COZAAR) 50 MG tablet; TAKE 1 TABLET (50 MG TOTAL) BY MOUTH DAILY. -     hydrochlorothiazide (HYDRODIURIL) 25 MG tablet; Take 1 tablet (25 mg total) by mouth daily. -     celecoxib (CELEBREX) 200 MG capsule; Take 1 capsule (200 mg total) by mouth 2 (two) times daily. -     buPROPion (WELLBUTRIN SR) 150 MG 12 hr tablet; Take 1 tablet by mouth twice a day (morning and bedtime)  Essential hypertension Blood pressure goal of less than 140/90 low-sodium, DASH diet, medication compliance, 150 minutes of moderate intensity exercise per week. Discussed medication compliance, adverse effects.  -     losartan (COZAAR) 50 MG  tablet; TAKE 1 TABLET (50 MG TOTAL) BY MOUTH DAILY. -     hydrochlorothiazide (HYDRODIURIL) 25 MG tablet; Take 1 tablet (25 mg total) by mouth daily. -     Comprehensive metabolic panel  Chronic left shoulder pain -     celecoxib (CELEBREX) 200 MG capsule; Take 1 capsule (200 mg  total) by mouth 2 (two) times daily.  Arthralgia, unspecified joint -     celecoxib (CELEBREX) 200 MG capsule; Take 1 capsule (200 mg total) by mouth 2 (two) times daily.  Depression, unspecified depression type -     buPROPion (WELLBUTRIN SR) 150 MG 12 hr tablet; Take 1 tablet by mouth twice a day (morning and bedtime)  Primary insomnia -     buPROPion (WELLBUTRIN SR) 150 MG 12 hr tablet; Take 1 tablet by mouth twice a day (morning and bedtime)  Hyperlipidemia associated with type 2 diabetes mellitus (HCC)  Healthy lifestyle diet of fruits vegetables fish nuts whole grains and low saturated fat . Foods high in cholesterol or liver, fatty meats,cheese, butter avocados, nuts and seeds, chocolate and fried foods. -     Lipid panel  Transient lingual papillitis Discussed causes  burns, cuts, or injuries to the mouth that can result in inflammation and swelling a dry mouth, eating very spicy or sour foods and exposure to extremely hot or cold foods  I am having Frances Martin "Frances Martin" start on nystatin. I am also having her maintain her aspirin EC, Glucosamine, pravastatin, pantoprazole, metFORMIN, losartan, hydrochlorothiazide, celecoxib, and buPROPion.  Meds ordered this encounter  Medications   nystatin (MYCOSTATIN) 100000 UNIT/ML suspension    Sig: Take 5 mLs (500,000 Units total) by mouth 4 (four) times daily.    Dispense:  60 mL    Refill:  0    Order Specific Question:   Supervising Provider    Answer:   Quentin Angst [[id number]]   pantoprazole (PROTONIX) 40 MG tablet    Sig: Take 1 tablet (40 mg total) by mouth daily.    Dispense:  30 tablet    Refill:  2    Order Specific  Question:   Supervising Provider    Answer:   Quentin Angst [[id number]]   metFORMIN (GLUCOPHAGE-XR) 500 MG 24 hr tablet    Sig: TAKE 1 TABLET (500 MG TOTAL) BY MOUTH 2 (TWO) TIMES DAILY AFTER A MEAL.    Dispense:  180 tablet    Refill:  1    Order Specific Question:   Supervising Provider    Answer:   Quentin Angst [[id number]]   losartan (COZAAR) 50 MG tablet    Sig: TAKE 1 TABLET (50 MG TOTAL) BY MOUTH DAILY.    Dispense:  90 tablet    Refill:  3    Order Specific Question:   Supervising Provider    Answer:   Quentin Angst [[id number]]   hydrochlorothiazide (HYDRODIURIL) 25 MG tablet    Sig: Take 1 tablet (25 mg total) by mouth daily.    Dispense:  90 tablet    Refill:  1    Order Specific Question:   Supervising Provider    Answer:   Quentin Angst [id number]   celecoxib (CELEBREX) 200 MG capsule    Sig: Take 1 capsule (200 mg total) by mouth 2 (two) times daily.    Dispense:  180 capsule    Refill:  1    Order Specific Question:   Supervising Provider    Answer:   Quentin Angst [[id number]]   buPROPion (WELLBUTRIN SR) 150 MG 12 hr tablet    Sig: Take 1 tablet by mouth twice a day (morning and bedtime)    Dispense:  90 tablet    Refill:  1    Order Specific Question:   Supervising Provider    Answer:   Hyman Hopes,  OLUGBEMIGA E [id number]    Swollen taste buds, also known as inflamed papillae, can occur for a number of reasons including eating hot, spicy, or cold food  Follow-up:   Return in about 6 months (around 08/28/2022) for DM/HTN.  The above assessment and management plan was discussed with the patient. The patient verbalized understanding of and has agreed to the management plan. Patient is aware to call the clinic if symptoms fail to improve or worsen. Patient is aware when to return to the clinic for a follow-up visit. Patient educated on when it is appropriate to go to the emergency department.   Juluis Mire, NP-C

## 2022-02-26 LAB — LIPID PANEL
Chol/HDL Ratio: 3.3 ratio (ref 0.0–4.4)
Cholesterol, Total: 209 mg/dL — ABNORMAL HIGH (ref 100–199)
HDL: 64 mg/dL (ref >39)
LDL Chol Calc (NIH): 124 mg/dL — ABNORMAL HIGH (ref 0–99)
Triglycerides: 122 mg/dL (ref 0–149)
VLDL Cholesterol Cal: 21 mg/dL (ref 5–40)

## 2022-02-26 LAB — COMPREHENSIVE METABOLIC PANEL
ALT: 39 IU/L — ABNORMAL HIGH (ref 0–32)
AST: 21 IU/L (ref 0–40)
Albumin/Globulin Ratio: 1.3 (ref 1.2–2.2)
Albumin: 4.3 g/dL (ref 3.8–4.9)
Alkaline Phosphatase: 84 IU/L (ref 44–121)
BUN/Creatinine Ratio: 17 (ref 12–28)
BUN: 15 mg/dL (ref 8–27)
Bilirubin Total: 0.2 mg/dL (ref 0.0–1.2)
CO2: 25 mmol/L (ref 20–29)
Calcium: 10 mg/dL (ref 8.7–10.3)
Chloride: 100 mmol/L (ref 96–106)
Creatinine, Ser: 0.87 mg/dL (ref 0.57–1.00)
Globulin, Total: 3.2 g/dL (ref 1.5–4.5)
Glucose: 134 mg/dL — ABNORMAL HIGH (ref 70–99)
Potassium: 4.6 mmol/L (ref 3.5–5.2)
Sodium: 142 mmol/L (ref 134–144)
Total Protein: 7.5 g/dL (ref 6.0–8.5)
eGFR: 76 mL/min/{1.73_m2} (ref >59)

## 2022-02-26 LAB — CBC WITH DIFFERENTIAL/PLATELET
Basophils Absolute: 0.1 10*3/uL (ref 0.0–0.2)
Basos: 1 %
EOS (ABSOLUTE): 0.1 10*3/uL (ref 0.0–0.4)
Eos: 2 %
Hematocrit: 40.6 % (ref 34.0–46.6)
Hemoglobin: 13 g/dL (ref 11.1–15.9)
Immature Grans (Abs): 0 10*3/uL (ref 0.0–0.1)
Immature Granulocytes: 0 %
Lymphocytes Absolute: 2.6 10*3/uL (ref 0.7–3.1)
Lymphs: 35 %
MCH: 28.9 pg (ref 26.6–33.0)
MCHC: 32 g/dL (ref 31.5–35.7)
MCV: 90 fL (ref 79–97)
Monocytes Absolute: 0.5 10*3/uL (ref 0.1–0.9)
Monocytes: 7 %
Neutrophils Absolute: 4 10*3/uL (ref 1.4–7.0)
Neutrophils: 55 %
Platelets: 345 10*3/uL (ref 150–450)
RBC: 4.5 x10E6/uL (ref 3.77–5.28)
RDW: 13.9 % (ref 11.7–15.4)
WBC: 7.3 10*3/uL (ref 3.4–10.8)

## 2022-03-01 LAB — CERVICOVAGINAL ANCILLARY ONLY
Bacterial Vaginitis (gardnerella): NEGATIVE
Candida Glabrata: NEGATIVE
Candida Vaginitis: NEGATIVE
Chlamydia: NEGATIVE
Comment: NEGATIVE
Comment: NEGATIVE
Comment: NEGATIVE
Comment: NEGATIVE
Comment: NEGATIVE
Comment: NORMAL
Neisseria Gonorrhea: NEGATIVE
Trichomonas: NEGATIVE

## 2022-03-21 ENCOUNTER — Other Ambulatory Visit (INDEPENDENT_AMBULATORY_CARE_PROVIDER_SITE_OTHER): Payer: Self-pay | Admitting: Primary Care

## 2022-03-21 ENCOUNTER — Other Ambulatory Visit: Payer: Self-pay

## 2022-03-21 DIAGNOSIS — Z760 Encounter for issue of repeat prescription: Secondary | ICD-10-CM

## 2022-03-21 DIAGNOSIS — E119 Type 2 diabetes mellitus without complications: Secondary | ICD-10-CM

## 2022-03-21 MED ORDER — PRAVASTATIN SODIUM 40 MG PO TABS
40.0000 mg | ORAL_TABLET | Freq: Every day | ORAL | 1 refills | Status: DC
  Filled 2022-03-21: qty 90, 90d supply, fill #0

## 2022-03-25 ENCOUNTER — Other Ambulatory Visit: Payer: Self-pay

## 2022-03-30 ENCOUNTER — Other Ambulatory Visit: Payer: Self-pay

## 2022-05-26 ENCOUNTER — Other Ambulatory Visit (INDEPENDENT_AMBULATORY_CARE_PROVIDER_SITE_OTHER): Payer: Self-pay | Admitting: Primary Care

## 2022-05-26 ENCOUNTER — Other Ambulatory Visit: Payer: Self-pay

## 2022-05-26 DIAGNOSIS — K2100 Gastro-esophageal reflux disease with esophagitis, without bleeding: Secondary | ICD-10-CM

## 2022-05-27 ENCOUNTER — Other Ambulatory Visit: Payer: Self-pay

## 2022-05-27 MED ORDER — PANTOPRAZOLE SODIUM 40 MG PO TBEC
40.0000 mg | DELAYED_RELEASE_TABLET | Freq: Every day | ORAL | 2 refills | Status: DC
  Filled 2022-05-27: qty 30, 30d supply, fill #0
  Filled 2022-11-16: qty 90, 90d supply, fill #0
  Filled 2023-04-16 – 2023-04-18 (×3): qty 90, 90d supply, fill #1

## 2022-05-27 NOTE — Telephone Encounter (Signed)
Requested Prescriptions  Pending Prescriptions Disp Refills  . pantoprazole (PROTONIX) 40 MG tablet 90 tablet 2    Sig: Take 1 tablet (40 mg total) by mouth daily.     Gastroenterology: Proton Pump Inhibitors Passed - 05/26/2022  1:42 PM      Passed - Valid encounter within last 12 months    Recent Outpatient Visits          3 months ago Type 2 diabetes mellitus without complication, without long-term current use of insulin ([employer])   [place] [facility] Juluis Mire P, NP   6 months ago Type 2 diabetes mellitus without complication, without long-term current use of insulin ([place])   [place], Michelle P, NP   9 months ago Essential hypertension   [place], Michelle P, NP   10 months ago Essential hypertension   [place], Michelle P, NP   1 year ago Tobacco dependence   [place], Michelle P, NP      Future Appointments            In 3 months Oletta Lamas, [place] Cage, NP [place]

## 2022-06-03 ENCOUNTER — Other Ambulatory Visit: Payer: Self-pay

## 2022-07-22 ENCOUNTER — Other Ambulatory Visit: Payer: Self-pay

## 2022-08-26 ENCOUNTER — Ambulatory Visit (INDEPENDENT_AMBULATORY_CARE_PROVIDER_SITE_OTHER): Payer: Self-pay | Admitting: Primary Care

## 2022-09-13 ENCOUNTER — Telehealth (INDEPENDENT_AMBULATORY_CARE_PROVIDER_SITE_OTHER): Payer: Self-pay | Admitting: Primary Care

## 2022-09-13 DIAGNOSIS — Z760 Encounter for issue of repeat prescription: Secondary | ICD-10-CM

## 2022-09-13 DIAGNOSIS — F5101 Primary insomnia: Secondary | ICD-10-CM

## 2022-09-13 DIAGNOSIS — E119 Type 2 diabetes mellitus without complications: Secondary | ICD-10-CM

## 2022-09-13 DIAGNOSIS — F32A Depression, unspecified: Secondary | ICD-10-CM

## 2022-09-13 NOTE — Telephone Encounter (Signed)
Medication Refill - Medication:   metFORMIN (GLUCOPHAGE-XR) 500 MG 24 hr tablet  buPROPion (WELLBUTRIN SR) 150 MG 12 hr tablet   Requesting 3 month supply    Has the patient contacted their pharmacy? Yes.   (Agent: If no, request that the patient contact the pharmacy for the refill. If patient does not wish to contact the pharmacy document the reason why and proceed with request.) (Agent: If yes, when and what did the pharmacy advise?)  Preferred Pharmacy (with phone number or street name):  [facility] [address], [place] [address]  Phone: [phone] Fax: [phone]   Has the patient been seen for an appointment in the last year OR does the patient have an upcoming appointment? Yes.    Agent: Please be advised that RX refills may take up to 3 business days. We ask that you follow-up with your pharmacy.

## 2022-09-14 NOTE — Telephone Encounter (Signed)
Requested Prescriptions  Pending Prescriptions Disp Refills   buPROPion (WELLBUTRIN SR) 150 MG 12 hr tablet 90 tablet 1    Sig: Take 1 tablet by mouth twice a day (morning and bedtime)     Psychiatry: Antidepressants - bupropion Failed - 09/13/2022 12:57 PM      Failed - ALT in normal range and within 360 days    ALT  Date Value Ref Range Status  02/25/2022 39 (H) 0 - 32 IU/L Final         Failed - Last BP in normal range    BP Readings from Last 1 Encounters:  02/25/22 (!) 142/86         Failed - Valid encounter within last 6 months    Recent Outpatient Visits           6 months ago Type 2 diabetes mellitus without complication, without long-term current use of insulin ([place])   [place] [facility] Kerin Perna, NP   9 months ago Type 2 diabetes mellitus without complication, without long-term current use of insulin ([place])   [place], Michelle P, NP   1 year ago Essential hypertension   [place], [place], NP   1 year ago Essential hypertension   [place], [place], NP   1 year ago Tobacco dependence   [place] Kerin Perna, NP              Passed - Cr in normal range and within 360 days    Creatinine, Ser  Date Value Ref Range Status  02/25/2022 0.87 0.57 - 1.00 mg/dL Final         Passed - AST in normal range and within 360 days    AST  Date Value Ref Range Status  02/25/2022 21 0 - 40 IU/L Final          metFORMIN (GLUCOPHAGE-XR) 500 MG 24 hr tablet 180 tablet 1    Sig: TAKE 1 TABLET (500 MG TOTAL) BY MOUTH 2 (TWO) TIMES DAILY AFTER A MEAL.     Endocrinology:  Diabetes - Biguanides Failed - 09/13/2022 12:57 PM      Failed - HBA1C is between 0 and 7.9 and within 180 days    Hemoglobin A1C  Date Value Ref Range Status  02/25/2022 6.7 (A) 4.0 - 5.6 % Final         Failed - B12  Level in normal range and within 720 days    No results found for: "VITAMINB12"       Failed - Valid encounter within last 6 months    Recent Outpatient Visits           6 months ago Type 2 diabetes mellitus without complication, without long-term current use of insulin ([place])   [place] [facility] Kerin Perna, NP   9 months ago Type 2 diabetes mellitus without complication, without long-term current use of insulin ([place])   [place] [facility] Kerin Perna, NP   1 year ago Essential hypertension   [place], Michelle P, NP   1 year ago Essential hypertension   [place], [place], NP   1 year ago Tobacco dependence   [place], Michelle P, NP  Passed - Cr in normal range and within 360 days    Creatinine, Ser  Date Value Ref Range Status  02/25/2022 0.87 0.57 - 1.00 mg/dL Final         Passed - eGFR in normal range and within 360 days    GFR calc Af Amer  Date Value Ref Range Status  11/13/2019 99 >59 mL/min/1.73 Final   GFR calc non Af Amer  Date Value Ref Range Status  11/13/2019 86 >59 mL/min/1.73 Final   eGFR  Date Value Ref Range Status  02/25/2022 76 >59 mL/min/1.73 Final         Passed - CBC within normal limits and completed in the last 12 months    WBC  Date Value Ref Range Status  02/25/2022 7.3 3.4 - 10.8 x10E3/uL Final   RBC  Date Value Ref Range Status  02/25/2022 4.50 3.77 - 5.28 x10E6/uL Final   Hemoglobin  Date Value Ref Range Status  02/25/2022 13.0 11.1 - 15.9 g/dL Final   Hematocrit  Date Value Ref Range Status  02/25/2022 40.6 34.0 - 46.6 % Final   MCHC  Date Value Ref Range Status  02/25/2022 32.0 31.5 - 35.7 g/dL Final   [facility]  Date Value Ref Range Status  02/25/2022 28.9 26.6 - 33.0 pg Final   MCV  Date Value Ref Range Status  02/25/2022 90 79 - 97  fL Final   No results found for: "PLTCOUNTKUC", "LABPLAT", "POCPLA" RDW  Date Value Ref Range Status  02/25/2022 13.9 11.7 - 15.4 % Final

## 2022-09-14 NOTE — Telephone Encounter (Signed)
Patient states that she has not received the letter from financial aid and patient states that she need the financial aid letter before she can make an appointment. Patient is out of both medications and is wondering if she can get a short refill until she can make an appointment.      Please advise

## 2022-09-19 ENCOUNTER — Encounter (INDEPENDENT_AMBULATORY_CARE_PROVIDER_SITE_OTHER): Payer: Self-pay | Admitting: Primary Care

## 2022-09-19 ENCOUNTER — Other Ambulatory Visit (INDEPENDENT_AMBULATORY_CARE_PROVIDER_SITE_OTHER): Payer: Self-pay

## 2022-09-19 ENCOUNTER — Other Ambulatory Visit (INDEPENDENT_AMBULATORY_CARE_PROVIDER_SITE_OTHER): Payer: Self-pay | Admitting: Primary Care

## 2022-09-19 ENCOUNTER — Other Ambulatory Visit: Payer: Self-pay

## 2022-09-19 DIAGNOSIS — E119 Type 2 diabetes mellitus without complications: Secondary | ICD-10-CM

## 2022-09-19 DIAGNOSIS — F5101 Primary insomnia: Secondary | ICD-10-CM

## 2022-09-19 DIAGNOSIS — Z760 Encounter for issue of repeat prescription: Secondary | ICD-10-CM

## 2022-09-19 DIAGNOSIS — F32A Depression, unspecified: Secondary | ICD-10-CM

## 2022-09-19 MED ORDER — METFORMIN HCL ER 500 MG PO TB24
500.0000 mg | ORAL_TABLET | Freq: Two times a day (BID) | ORAL | 0 refills | Status: DC
  Filled 2022-09-19: qty 60, 30d supply, fill #0

## 2022-09-19 MED ORDER — BUPROPION HCL ER (SR) 150 MG PO TB12
ORAL_TABLET | ORAL | 0 refills | Status: DC
  Filled 2022-09-19: qty 60, 30d supply, fill #0

## 2022-09-20 ENCOUNTER — Other Ambulatory Visit: Payer: Self-pay

## 2022-10-10 ENCOUNTER — Encounter (INDEPENDENT_AMBULATORY_CARE_PROVIDER_SITE_OTHER): Payer: Self-pay | Admitting: Primary Care

## 2022-10-10 ENCOUNTER — Ambulatory Visit (INDEPENDENT_AMBULATORY_CARE_PROVIDER_SITE_OTHER): Payer: Self-pay | Admitting: Primary Care

## 2022-10-10 ENCOUNTER — Other Ambulatory Visit: Payer: Self-pay

## 2022-10-10 VITALS — BP 112/72 | HR 96 | Resp 16 | Ht 63.5 in | Wt 195.6 lb

## 2022-10-10 DIAGNOSIS — J029 Acute pharyngitis, unspecified: Secondary | ICD-10-CM

## 2022-10-10 DIAGNOSIS — I10 Essential (primary) hypertension: Secondary | ICD-10-CM

## 2022-10-10 DIAGNOSIS — E1169 Type 2 diabetes mellitus with other specified complication: Secondary | ICD-10-CM

## 2022-10-10 DIAGNOSIS — Z760 Encounter for issue of repeat prescription: Secondary | ICD-10-CM

## 2022-10-10 DIAGNOSIS — E119 Type 2 diabetes mellitus without complications: Secondary | ICD-10-CM

## 2022-10-10 DIAGNOSIS — E785 Hyperlipidemia, unspecified: Secondary | ICD-10-CM

## 2022-10-10 DIAGNOSIS — F32A Depression, unspecified: Secondary | ICD-10-CM

## 2022-10-10 DIAGNOSIS — F5101 Primary insomnia: Secondary | ICD-10-CM

## 2022-10-10 MED ORDER — AMOXICILLIN 500 MG PO CAPS
500.0000 mg | ORAL_CAPSULE | Freq: Three times a day (TID) | ORAL | 0 refills | Status: DC
  Filled 2022-10-10: qty 30, 10d supply, fill #0

## 2022-10-10 MED ORDER — LOSARTAN POTASSIUM 50 MG PO TABS
50.0000 mg | ORAL_TABLET | Freq: Every day | ORAL | 1 refills | Status: DC
  Filled 2022-10-10: qty 90, fill #0
  Filled 2022-11-16: qty 90, 90d supply, fill #0
  Filled 2023-02-12 – 2023-02-13 (×2): qty 90, 90d supply, fill #1

## 2022-10-10 MED ORDER — BUPROPION HCL ER (SR) 150 MG PO TB12
150.0000 mg | ORAL_TABLET | Freq: Two times a day (BID) | ORAL | 1 refills | Status: DC
  Filled 2022-10-10: qty 180, fill #0
  Filled 2022-10-25: qty 180, 90d supply, fill #0
  Filled 2022-12-26: qty 180, 90d supply, fill #1
  Filled ????-??-??: fill #1

## 2022-10-10 MED ORDER — HYDROCHLOROTHIAZIDE 25 MG PO TABS
25.0000 mg | ORAL_TABLET | Freq: Every day | ORAL | 1 refills | Status: DC
  Filled 2022-10-10 – 2022-10-25 (×2): qty 90, 90d supply, fill #0
  Filled 2023-01-18: qty 90, 90d supply, fill #1

## 2022-10-10 MED ORDER — METFORMIN HCL ER 500 MG PO TB24
500.0000 mg | ORAL_TABLET | Freq: Two times a day (BID) | ORAL | 1 refills | Status: DC
  Filled 2022-10-10 – 2022-10-25 (×2): qty 180, 90d supply, fill #0

## 2022-10-10 NOTE — Patient Instructions (Addendum)
Haga grgaras con Olin Hauser, analgsicos y lquidos adicionales para ayudar a Public house manager los sntomas   aceite [place] para Dolor de oido   El aceite dulce tambin es popular por sus propiedades que pueden ofrecer alivio para los dolores de odo y las infecciones menores de odo. Sus propiedades Animal nutritionist, Youth worker antiinflamatorios y accin antibacteriana contribuyen potencialmente a Public house manager las molestias relacionadas con el odo.

## 2022-10-10 NOTE — Progress Notes (Signed)
Kosse, is a 62 y.o. female  [id number]  [id number]  DOB - Aug 28, 1960  Chief Complaint  Patient presents with   Diabetes   Hypertension   Medication Refill   Sore Throat       Subjective:   Frances Martin is a 62 y.o. Hispanic female (interpreter Ludwig Clarks [phone] here today for c/o sore throat for the last 10 days. Management of T2D - denies polyuria, polydipsia, polyphagia, or vision changes.  Blood pressure today is unremarkable 112/72. Patient has No headache, No chest pain, No abdominal pain - No Nausea, No new weakness tingling or numbness, No Cough - shortness of breath  No problems updated.  No Known Allergies  Past Medical History:  Diagnosis Date   Hemorrhoids    Hyperlipidemia    Hypertension    Pre-diabetes     Current Outpatient Medications on File Prior to Visit  Medication Sig Dispense Refill   celecoxib (CELEBREX) 200 MG capsule Take 1 capsule (200 mg total) by mouth 2 (two) times daily. 180 capsule 1   Glucosamine 500 MG CAPS Take 1 capsule (500 mg total) by mouth daily. 90 capsule 1   pantoprazole (PROTONIX) 40 MG tablet Take 1 tablet (40 mg total) by mouth daily. 90 tablet 2   No current facility-administered medications on file prior to visit.    Objective:   Vitals:   10/10/22 1438  BP: 112/72  Pulse: 96  Resp: 16  SpO2: 98%  Weight: 195 lb 9.6 oz (88.7 kg)  Height: 5' 3.5" (1.613 m)    Comprehensive ROS Pertinent positive and negative noted in HPI   Exam General appearance : Awake, alert, not in any distress. Speech Clear. Not toxic looking HEENT: Atraumatic and Normocephalic, pupils equally reactive to light and accomodation Neck: Bilateral swollen tonsils and cervical lymph nodes , throat erythema, right turbinate is red boggy and left is pink and less mucus.  Good air entry bilaterally, no added sounds  CVS: S1 S2 regular, no murmurs.  Abdomen: Bowel sounds present, Non tender  and not distended with no gaurding, rigidity or rebound. Extremities: B/L Lower Ext shows no edema, both legs are warm to touch Neurology: Awake alert, and oriented X 3, CN II-XII intact, Non focal Skin: No Rash  Data Review Lab Results  Component Value Date   HGBA1C 6.7 (A) 02/25/2022   HGBA1C 6.6 (A) 11/22/2021   HGBA1C 6.2 (A) 07/20/2021    Assessment & Plan  Frances Martin was seen today for diabetes, hypertension, medication refill and sore throat.  Diagnoses and all orders for this visit:  Type 2 diabetes mellitus without complication, without long-term current use of insulin ([place]) - educated on lifestyle modifications, including but not limited to diet choices and adding exercise to daily routine.   -     Microalbumin / creatinine urine ratio -     CBC with Differential/Platelet -     Hemoglobin A1c  Essential hypertension Well-controlled -     CMP14+EGFR  Hyperlipidemia associated with type 2 diabetes mellitus (HCC) -     Lipid panel  Acute pharyngitis, unspecified etiology Treatment plan antibiotics Chloraseptic spray and Flonase Frances Martin was seen today for diabetes, hypertension, medication refill and sore throat.  Primary insomnia 2/2 Depression, unspecified depression type -     buPROPion (WELLBUTRIN SR) 150 MG 12 hr tablet; Take 1 tablet by mouth twice a day (morning and bedtime)  Medication refill -     buPROPion (WELLBUTRIN SR) 150 MG 12  hr tablet; Take 1 tablet by mouth twice a day (morning and bedtime) -     hydrochlorothiazide (HYDRODIURIL) 25 MG tablet; Take 1 tablet (25 mg total) by mouth daily. -     losartan (COZAAR) 50 MG tablet; TAKE 1 TABLET (50 MG TOTAL) BY MOUTH DAILY. -     metFORMIN (GLUCOPHAGE-XR) 500 MG 24 hr tablet; Take 1 tablet (500 mg total) by mouth in the morning and at bedtime.    Patient have been counseled extensively about nutrition and exercise. Other issues discussed during this visit include: low cholesterol diet, weight control and  daily exercise, foot care, annual eye examinations at Ophthalmology, importance of adherence with medications and regular follow-up. We also discussed long term complications of uncontrolled diabetes and hypertension.   Return in 3 months (on 01/08/2023).  The patient was given clear instructions to go to ER or return to medical center if symptoms don't improve, worsen or new problems develop. The patient verbalized understanding. The patient was told to call to get lab results if they haven't heard anything in the next week.   This note has been created with Surveyor, quantity. Any transcriptional errors are unintentional.   Kerin Perna, NP 10/10/2022, 3:55 PM

## 2022-10-11 ENCOUNTER — Other Ambulatory Visit: Payer: Self-pay

## 2022-10-25 ENCOUNTER — Other Ambulatory Visit: Payer: Self-pay

## 2022-11-16 ENCOUNTER — Other Ambulatory Visit: Payer: Self-pay

## 2022-11-16 ENCOUNTER — Other Ambulatory Visit (INDEPENDENT_AMBULATORY_CARE_PROVIDER_SITE_OTHER): Payer: Self-pay

## 2022-11-16 DIAGNOSIS — E785 Hyperlipidemia, unspecified: Secondary | ICD-10-CM

## 2022-11-16 DIAGNOSIS — E1169 Type 2 diabetes mellitus with other specified complication: Secondary | ICD-10-CM

## 2022-11-17 LAB — CMP14+EGFR
ALT: 64 IU/L — ABNORMAL HIGH (ref 0–32)
AST: 32 IU/L (ref 0–40)
Albumin/Globulin Ratio: 1.4 (ref 1.2–2.2)
Albumin: 4.3 g/dL (ref 3.8–4.9)
Alkaline Phosphatase: 84 IU/L (ref 44–121)
BUN/Creatinine Ratio: 16 (ref 12–28)
BUN: 13 mg/dL (ref 8–27)
Bilirubin Total: 0.3 mg/dL (ref 0.0–1.2)
CO2: 21 mmol/L (ref 20–29)
Calcium: 9.3 mg/dL (ref 8.7–10.3)
Chloride: 99 mmol/L (ref 96–106)
Creatinine, Ser: 0.8 mg/dL (ref 0.57–1.00)
Globulin, Total: 3.1 g/dL (ref 1.5–4.5)
Glucose: 139 mg/dL — ABNORMAL HIGH (ref 70–99)
Potassium: 4.3 mmol/L (ref 3.5–5.2)
Sodium: 138 mmol/L (ref 134–144)
Total Protein: 7.4 g/dL (ref 6.0–8.5)
eGFR: 84 mL/min/{1.73_m2} (ref >59)

## 2022-11-17 LAB — CBC WITH DIFFERENTIAL/PLATELET
Basophils Absolute: 0.1 10*3/uL (ref 0.0–0.2)
Basos: 1 %
EOS (ABSOLUTE): 0.1 10*3/uL (ref 0.0–0.4)
Eos: 2 %
Hematocrit: 41.1 % (ref 34.0–46.6)
Hemoglobin: 13.5 g/dL (ref 11.1–15.9)
Immature Grans (Abs): 0 10*3/uL (ref 0.0–0.1)
Immature Granulocytes: 0 %
Lymphocytes Absolute: 3 10*3/uL (ref 0.7–3.1)
Lymphs: 42 %
MCH: 28.8 pg (ref 26.6–33.0)
MCHC: 32.8 g/dL (ref 31.5–35.7)
MCV: 88 fL (ref 79–97)
Monocytes Absolute: 0.5 10*3/uL (ref 0.1–0.9)
Monocytes: 7 %
Neutrophils Absolute: 3.4 10*3/uL (ref 1.4–7.0)
Neutrophils: 48 %
Platelets: 337 10*3/uL (ref 150–450)
RBC: 4.69 x10E6/uL (ref 3.77–5.28)
RDW: 14.2 % (ref 11.7–15.4)
WBC: 7 10*3/uL (ref 3.4–10.8)

## 2022-11-17 LAB — LIPID PANEL
Chol/HDL Ratio: 3.8 ratio (ref 0.0–4.4)
Cholesterol, Total: 211 mg/dL — ABNORMAL HIGH (ref 100–199)
HDL: 56 mg/dL (ref >39)
LDL Chol Calc (NIH): 125 mg/dL — ABNORMAL HIGH (ref 0–99)
Triglycerides: 171 mg/dL — ABNORMAL HIGH (ref 0–149)
VLDL Cholesterol Cal: 30 mg/dL (ref 5–40)

## 2022-11-17 LAB — HEMOGLOBIN A1C
Est. average glucose Bld gHb Est-mCnc: 183 mg/dL
Hgb A1c MFr Bld: 8 % — ABNORMAL HIGH (ref 4.8–5.6)

## 2022-11-17 LAB — MICROALBUMIN / CREATININE URINE RATIO
Creatinine, Urine: 84.2 mg/dL
Microalb/Creat Ratio: 23 mg/g creat (ref 0–29)
Microalbumin, Urine: 19.4 ug/mL

## 2022-11-18 ENCOUNTER — Other Ambulatory Visit (INDEPENDENT_AMBULATORY_CARE_PROVIDER_SITE_OTHER): Payer: Self-pay | Admitting: Primary Care

## 2022-11-18 ENCOUNTER — Other Ambulatory Visit: Payer: Self-pay

## 2022-11-18 MED ORDER — PRAVASTATIN SODIUM 80 MG PO TABS
80.0000 mg | ORAL_TABLET | Freq: Every day | ORAL | 3 refills | Status: DC
  Filled 2022-11-18: qty 30, 30d supply, fill #0

## 2022-11-21 ENCOUNTER — Other Ambulatory Visit: Payer: Self-pay

## 2022-11-23 ENCOUNTER — Other Ambulatory Visit: Payer: Self-pay

## 2022-11-30 ENCOUNTER — Ambulatory Visit: Payer: Self-pay | Attending: Primary Care

## 2022-12-19 ENCOUNTER — Other Ambulatory Visit: Payer: Self-pay

## 2022-12-26 ENCOUNTER — Other Ambulatory Visit: Payer: Self-pay

## 2023-01-10 ENCOUNTER — Other Ambulatory Visit: Payer: Self-pay

## 2023-01-10 ENCOUNTER — Ambulatory Visit (INDEPENDENT_AMBULATORY_CARE_PROVIDER_SITE_OTHER): Payer: Self-pay | Admitting: Primary Care

## 2023-01-10 ENCOUNTER — Encounter (INDEPENDENT_AMBULATORY_CARE_PROVIDER_SITE_OTHER): Payer: Self-pay | Admitting: Primary Care

## 2023-01-10 VITALS — BP 118/74 | HR 80 | Resp 16 | Wt 193.4 lb

## 2023-01-10 DIAGNOSIS — Z23 Encounter for immunization: Secondary | ICD-10-CM

## 2023-01-10 DIAGNOSIS — Z760 Encounter for issue of repeat prescription: Secondary | ICD-10-CM

## 2023-01-10 DIAGNOSIS — G8929 Other chronic pain: Secondary | ICD-10-CM

## 2023-01-10 DIAGNOSIS — M25561 Pain in right knee: Secondary | ICD-10-CM

## 2023-01-10 DIAGNOSIS — E119 Type 2 diabetes mellitus without complications: Secondary | ICD-10-CM

## 2023-01-10 DIAGNOSIS — Z1231 Encounter for screening mammogram for malignant neoplasm of breast: Secondary | ICD-10-CM

## 2023-01-10 MED ORDER — METFORMIN HCL ER 500 MG PO TB24
500.0000 mg | ORAL_TABLET | Freq: Two times a day (BID) | ORAL | 1 refills | Status: DC
  Filled 2023-01-10 – 2023-01-18 (×2): qty 180, 90d supply, fill #0
  Filled 2023-04-16 – 2023-04-18 (×3): qty 180, 90d supply, fill #1

## 2023-01-10 NOTE — Progress Notes (Signed)
   Acute Office Visit  Subjective:     [name], female    DOB: 1961/02/17, 62 y.o.   MRN: [id number]  Chief Complaint  Patient presents with   Diabetes   Hypertension    Diabetes  Hypertension Frances Martin is a 62 year old Hispanic female (interpreter Jose) acute visit Frances Martin fell 3 months wearing a knee brace still swollen and painful but relief with Celebrex. Initially thought appt was for HTN/DM. Bp unremarkable . Patient has No headache, No chest pain, No abdominal pain - No Nausea, No new weakness tingling or numbness, No Cough - shortness of breath. A1C taken in able not due for recheck  until July. Frances Martin has stopped smoking for a 1 1/2 .  ROS Comprehensive ROS Pertinent positive and negative noted in HPI       Objective:    Blood Pressure 118/74   Pulse 80   Respiration 16   Weight 193 lb 6.4 oz (87.7 kg)   Oxygen Saturation 97%   Body Mass Index 33.72 kg/m  BP Readings from Last 3 Encounters:  01/10/23 118/74  10/10/22 112/72  02/25/22 (Abnormal) 142/86      Physical Exam Constitutional:      Appearance: Frances Martin is obese.  HENT:     Head: Normocephalic.     Right Ear: Tympanic membrane and external ear normal.     Left Ear: Tympanic membrane and external ear normal.  Eyes:     Extraocular Movements: Extraocular movements intact.     Pupils: Pupils are equal, round, and reactive to light.  Cardiovascular:     Rate and Rhythm: Normal rate and regular rhythm.  Pulmonary:     Effort: Pulmonary effort is normal.     Breath sounds: Normal breath sounds.  Abdominal:     General: Bowel sounds are normal. There is distension.     Palpations: Abdomen is soft.  Musculoskeletal:        General: Signs of injury present. Normal range of motion.     Cervical back: Normal range of motion and neck supple.     Comments: Right knee fall   Skin:    General: Skin is warm and dry.  Neurological:     Mental Status: Frances Martin is alert and oriented  to person, place, and time.  Psychiatric:        Mood and Affect: Mood normal.        Behavior: Behavior normal.   No results found for any visits on 01/10/23.      Assessment & Plan:  Roena was seen today for diabetes and hypertension.  Diagnoses and all orders for this visit:  Encounter for screening mammogram for malignant neoplasm of breast -     MM DIGITAL SCREENING BILATERAL; Future  Medication refill -     metFORMIN (GLUCOPHAGE-XR) 500 MG 24 hr tablet; Take 1 tablet (500 mg total) by mouth in the morning and at bedtime.  Type 2 diabetes mellitus without complication, without long-term current use of insulin (HCC) -     metFORMIN (GLUCOPHAGE-XR) 500 MG 24 hr tablet; Take 1 tablet (500 mg total) by mouth in the morning and at bedtime.  Chronic pain of right knee  Fell 3 months ago Celebrex helping with the pain  Other orders -     Varicella-zoster vaccine IM    Grayce Sessions, NP

## 2023-01-12 ENCOUNTER — Telehealth: Payer: Self-pay

## 2023-01-12 NOTE — Telephone Encounter (Signed)
Returned patient's telephone call using interpreter. Left voice message with BCCCP contact information.

## 2023-01-18 ENCOUNTER — Other Ambulatory Visit: Payer: Self-pay

## 2023-02-02 ENCOUNTER — Inpatient Hospital Stay: Admission: RE | Admit: 2023-02-02 | Payer: Self-pay | Source: Ambulatory Visit

## 2023-02-09 ENCOUNTER — Ambulatory Visit
Admission: RE | Admit: 2023-02-09 | Discharge: 2023-02-09 | Disposition: A | Discharge status: Home / Self Care | Payer: No Typology Code available for payment source | Source: Ambulatory Visit | Attending: Primary Care | Admitting: Primary Care

## 2023-02-09 DIAGNOSIS — Z1231 Encounter for screening mammogram for malignant neoplasm of breast: Secondary | ICD-10-CM

## 2023-02-13 ENCOUNTER — Other Ambulatory Visit: Payer: Self-pay

## 2023-02-13 ENCOUNTER — Other Ambulatory Visit (INDEPENDENT_AMBULATORY_CARE_PROVIDER_SITE_OTHER): Payer: Self-pay | Admitting: Primary Care

## 2023-02-13 DIAGNOSIS — M2550 Pain in unspecified joint: Secondary | ICD-10-CM

## 2023-02-13 DIAGNOSIS — Z760 Encounter for issue of repeat prescription: Secondary | ICD-10-CM

## 2023-02-13 DIAGNOSIS — G8929 Other chronic pain: Secondary | ICD-10-CM

## 2023-02-14 ENCOUNTER — Other Ambulatory Visit: Payer: Self-pay | Admitting: Primary Care

## 2023-02-14 ENCOUNTER — Other Ambulatory Visit: Payer: Self-pay

## 2023-02-14 DIAGNOSIS — R928 Other abnormal and inconclusive findings on diagnostic imaging of breast: Secondary | ICD-10-CM

## 2023-02-14 MED ORDER — CELECOXIB 200 MG PO CAPS
200.0000 mg | ORAL_CAPSULE | Freq: Two times a day (BID) | ORAL | 0 refills | Status: DC
  Filled 2023-02-14 – 2023-04-18 (×4): qty 180, 90d supply, fill #0

## 2023-02-14 NOTE — Telephone Encounter (Signed)
Requested Prescriptions  Pending Prescriptions Disp Refills   celecoxib (CELEBREX) 200 MG capsule 180 capsule 0    Sig: Take 1 capsule (200 mg total) by mouth 2 (two) times daily.     Analgesics:  COX2 Inhibitors Failed - 02/13/2023  7:32 AM      Failed - Manual Review: Labs are only required if the patient has taken medication for more than 8 weeks.      Failed - ALT in normal range and within 360 days    ALT  Date Value Ref Range Status  11/16/2022 64 (H) 0 - 32 IU/L Final         Passed - HGB in normal range and within 360 days    Hemoglobin  Date Value Ref Range Status  11/16/2022 13.5 11.1 - 15.9 g/dL Final         Passed - Cr in normal range and within 360 days    Creatinine, Ser  Date Value Ref Range Status  11/16/2022 0.80 0.57 - 1.00 mg/dL Final         Passed - HCT in normal range and within 360 days    Hematocrit  Date Value Ref Range Status  11/16/2022 41.1 34.0 - 46.6 % Final         Passed - AST in normal range and within 360 days    AST  Date Value Ref Range Status  11/16/2022 32 0 - 40 IU/L Final         Passed - eGFR is 30 or above and within 360 days    GFR calc Af Amer  Date Value Ref Range Status  11/13/2019 99 >59 mL/min/1.73 Final   GFR calc non Af Amer  Date Value Ref Range Status  11/13/2019 86 >59 mL/min/1.73 Final   eGFR  Date Value Ref Range Status  11/16/2022 84 >59 mL/min/1.73 Final         Passed - Patient is not pregnant      Passed - Valid encounter within last 12 months    Recent Outpatient Visits           1 month ago Encounter for screening mammogram for malignant neoplasm of breast   [place] [facility] Grayce Sessions, NP   4 months ago Type 2 diabetes mellitus without complication, without long-term current use of insulin (HCC)   [place] [facility] Grayce Sessions, NP   11 months ago Type 2 diabetes mellitus without complication, without long-term current use of insulin  (HCC)   [place] [facility] Grayce Sessions, NP   1 year ago Type 2 diabetes mellitus without complication, without long-term current use of insulin (HCC)   [place] [facility] Grayce Sessions, NP   1 year ago Essential hypertension   [place] [facility] Grayce Sessions, NP       Future Appointments             In 1 month Randa Evens, Kinnie Scales, NP [place] [facility]

## 2023-02-21 ENCOUNTER — Other Ambulatory Visit: Payer: Self-pay

## 2023-02-28 ENCOUNTER — Ambulatory Visit: Payer: Self-pay | Admitting: *Deleted

## 2023-02-28 VITALS — BP 120/84 | Wt 193.4 lb

## 2023-02-28 DIAGNOSIS — Z1211 Encounter for screening for malignant neoplasm of colon: Secondary | ICD-10-CM

## 2023-02-28 DIAGNOSIS — Z1239 Encounter for other screening for malignant neoplasm of breast: Secondary | ICD-10-CM

## 2023-02-28 NOTE — Progress Notes (Addendum)
[name] is a 62 y.o. female who presents to [facility] clinic today with no complaints.    Pap Smear: Pap not smear completed today. Last Pap smear was 04/15/2020 at [facility] and was normal. Per patient has no history of an abnormal Pap smear. Last Pap smear result is available in Epic.   Physical exam: Breasts Breasts symmetrical. No skin abnormalities bilateral breasts. No nipple retraction bilateral breasts. No nipple discharge bilateral breasts. No lymphadenopathy. No lumps palpated bilateral breasts.       ..MS 3D SCR MAMMO BILAT BR (aka MM)  Result Date: 02/13/2023 CLINICAL DATA:  Screening. EXAM: DIGITAL SCREENING BILATERAL MAMMOGRAM WITH TOMOSYNTHESIS AND CAD TECHNIQUE: Bilateral screening digital craniocaudal and mediolateral oblique mammograms were obtained. Bilateral screening digital breast tomosynthesis was performed. The images were evaluated with computer-aided detection. COMPARISON:  Previous exam(s). ACR Breast Density Category b: There are scattered areas of fibroglandular density. FINDINGS: In the left breast, a possible mass warrants further evaluation. This possible mass is seen within the outer central LEFT breast on cc slice 59. In the right breast, no findings suspicious for malignancy. IMPRESSION: Further evaluation is suggested for a possible mass in the left breast. RECOMMENDATION: Diagnostic mammogram and possibly ultrasound of the left breast. (Code:FI-L-58M) The patient will be contacted regarding the findings, and additional imaging will be scheduled. BI-RADS CATEGORY  0: Incomplete: Need additional imaging evaluation. Electronically Signed   By: Bary Richard M.D.   On: 02/13/2023 14:25   MS DIGITAL SCREENING TOMO BILATERAL  Result Date: 12/18/2020 CLINICAL DATA:  Screening. EXAM: DIGITAL SCREENING BILATERAL MAMMOGRAM WITH TOMOSYNTHESIS AND CAD TECHNIQUE: Bilateral screening digital craniocaudal and mediolateral oblique mammograms were obtained.  Bilateral screening digital breast tomosynthesis was performed. The images were evaluated with computer-aided detection. COMPARISON:  Previous exam(s). ACR Breast Density Category b: There are scattered areas of fibroglandular density. FINDINGS: There are no findings suspicious for malignancy. The images were evaluated with computer-aided detection. IMPRESSION: No mammographic evidence of malignancy. A result letter of this screening mammogram will be mailed directly to the patient. RECOMMENDATION: Screening mammogram in one year. (Code:SM-B-01Y) BI-RADS CATEGORY  1: Negative. Electronically Signed   By: Edwin Cap M.D.   On: 12/18/2020 10:55   MS DIGITAL SCREENING TOMO BILATERAL  Result Date: 10/29/2019 CLINICAL DATA:  Screening. EXAM: DIGITAL SCREENING BILATERAL MAMMOGRAM WITH TOMO AND CAD COMPARISON:  Previous exam(s). ACR Breast Density Category b: There are scattered areas of fibroglandular density. FINDINGS: There are no findings suspicious for malignancy. Images were processed with CAD. IMPRESSION: No mammographic evidence of malignancy. A result letter of this screening mammogram will be mailed directly to the patient. RECOMMENDATION: Screening mammogram in one year. (Code:SM-B-01Y) BI-RADS CATEGORY  1: Negative. Electronically Signed   By: Emmaline Kluver M.D.   On: 10/29/2019 11:11   MS DIGITAL SCREENING TOMO BILATERAL  Result Date: 10/16/2018 CLINICAL DATA:  Screening. EXAM: DIGITAL SCREENING BILATERAL MAMMOGRAM WITH TOMO AND CAD COMPARISON:  None. ACR Breast Density Category b: There are scattered areas of fibroglandular density. FINDINGS: There are no findings suspicious for malignancy. Images were processed with CAD. IMPRESSION: No mammographic evidence of malignancy. A result letter of this screening mammogram will be mailed directly to the patient. RECOMMENDATION: Screening mammogram in one year. (Code:SM-B-01Y) BI-RADS CATEGORY  1: Negative. Electronically Signed   By: Gerome Sam  III M.D   On: 10/16/2018 16:50      Pelvic/Bimanual Pap is not indicated today per BCCCP guidelines.   Smoking History: Patient is a  former smoker. Not referred to quit line.    Patient Navigation: Patient education provided. Access to services provided for patient through BCCCP program. Natale Lay interpreter provided. No transportation provided   Colorectal Cancer Screening: Per patient has had colonoscopy completed on 2019, and was normal. Colorectal FIT given to complete and mail.   No complaints today.    Breast and Cervical Cancer Risk Assessment: Patient does not have family history of breast cancer, known genetic mutations, or radiation treatment to the chest before age 74. Patient does not have history of cervical dysplasia, immunocompromised, or DES exposure in-utero.  Risk Assessment   No risk assessment data for the current encounter  Risk Scores       10/16/2018   Last edited by: Lynnell Dike, LPN   5-year risk: 0.8%   Lifetime risk: 5.7%            A: BCCCP exam without pap smear No complaints   P: Referred patient to the [facility] for a diagnostic mammogram. Appointment scheduled 03/06/2023 at 110 pm.  Joette Catching, RN 02/28/2023 10:53 AM   Attestation of Supervision of Student:  I confirm that I have verified the information documented in the nurse practitioner student's note and that I have also personally performed the history, physical exam and all medical decision making activities.  I have verified that all services and findings are accurately documented in this student's note; and I agree with management and plan as outlined in the documentation. I have also made any necessary editorial changes.  Brannock, Kathaleen Maser, RN [facility] [employer] 02/28/2023 11:45 AM

## 2023-02-28 NOTE — Patient Instructions (Signed)
Explained breast self awareness with Frances Martin. Patient did not need a Pap smear today due to last Pap smear was 04/15/2020. Let her know BCCCP will cover Pap smears every 3 years unless has a history of abnormal Pap smears. Referred patient to the [facility] for diagnostic mammogram per recommendation. Appointment scheduled for Monday, March 06, 2023 at 1310. Patient aware of appointment and will be there. Frances Martin verbalized understanding.  Barbera Perritt, Kathaleen Maser, RN 11:45 AM

## 2023-03-06 ENCOUNTER — Ambulatory Visit
Admission: RE | Admit: 2023-03-06 | Discharge: 2023-03-06 | Disposition: A | Discharge status: Home / Self Care | Payer: No Typology Code available for payment source | Source: Ambulatory Visit | Attending: Primary Care | Admitting: Primary Care

## 2023-03-06 DIAGNOSIS — R928 Other abnormal and inconclusive findings on diagnostic imaging of breast: Secondary | ICD-10-CM

## 2023-04-12 ENCOUNTER — Encounter (INDEPENDENT_AMBULATORY_CARE_PROVIDER_SITE_OTHER): Payer: Self-pay

## 2023-04-12 ENCOUNTER — Ambulatory Visit (INDEPENDENT_AMBULATORY_CARE_PROVIDER_SITE_OTHER): Payer: Self-pay | Admitting: Primary Care

## 2023-04-16 ENCOUNTER — Other Ambulatory Visit (INDEPENDENT_AMBULATORY_CARE_PROVIDER_SITE_OTHER): Payer: Self-pay | Admitting: Primary Care

## 2023-04-16 DIAGNOSIS — F32A Depression, unspecified: Secondary | ICD-10-CM

## 2023-04-16 DIAGNOSIS — F5101 Primary insomnia: Secondary | ICD-10-CM

## 2023-04-16 DIAGNOSIS — I10 Essential (primary) hypertension: Secondary | ICD-10-CM

## 2023-04-16 DIAGNOSIS — Z760 Encounter for issue of repeat prescription: Secondary | ICD-10-CM

## 2023-04-18 ENCOUNTER — Other Ambulatory Visit: Payer: Self-pay

## 2023-04-18 MED ORDER — HYDROCHLOROTHIAZIDE 25 MG PO TABS
25.0000 mg | ORAL_TABLET | Freq: Every day | ORAL | 1 refills | Status: DC
Start: 2023-04-18 — End: 2023-06-19
  Filled 2023-04-18 – 2023-04-19 (×2): qty 30, 30d supply, fill #0
  Filled 2023-05-23: qty 30, 30d supply, fill #1

## 2023-04-18 MED ORDER — BUPROPION HCL ER (SR) 150 MG PO TB12
150.0000 mg | ORAL_TABLET | Freq: Two times a day (BID) | ORAL | 1 refills | Status: DC
Start: 2023-04-18 — End: 2023-05-26
  Filled 2023-04-18 – 2023-04-19 (×2): qty 60, 30d supply, fill #0
  Filled 2023-05-23: qty 60, 30d supply, fill #1

## 2023-04-18 NOTE — Telephone Encounter (Signed)
Needs appt

## 2023-04-19 ENCOUNTER — Other Ambulatory Visit: Payer: Self-pay

## 2023-04-20 ENCOUNTER — Other Ambulatory Visit: Payer: Self-pay

## 2023-04-20 ENCOUNTER — Other Ambulatory Visit (INDEPENDENT_AMBULATORY_CARE_PROVIDER_SITE_OTHER): Payer: Self-pay | Admitting: Primary Care

## 2023-04-20 DIAGNOSIS — G8929 Other chronic pain: Secondary | ICD-10-CM

## 2023-04-20 DIAGNOSIS — K2100 Gastro-esophageal reflux disease with esophagitis, without bleeding: Secondary | ICD-10-CM

## 2023-04-20 DIAGNOSIS — Z760 Encounter for issue of repeat prescription: Secondary | ICD-10-CM

## 2023-04-20 DIAGNOSIS — M2550 Pain in unspecified joint: Secondary | ICD-10-CM

## 2023-04-20 DIAGNOSIS — I10 Essential (primary) hypertension: Secondary | ICD-10-CM

## 2023-04-20 MED ORDER — LOSARTAN POTASSIUM 50 MG PO TABS
50.0000 mg | ORAL_TABLET | Freq: Every day | ORAL | 1 refills | Status: DC
Start: 2023-04-20 — End: 2023-12-01
  Filled 2023-04-20 – 2023-05-23 (×2): qty 90, 90d supply, fill #0
  Filled 2023-08-17: qty 90, 90d supply, fill #1

## 2023-04-20 MED ORDER — PANTOPRAZOLE SODIUM 40 MG PO TBEC
40.0000 mg | DELAYED_RELEASE_TABLET | Freq: Every day | ORAL | 2 refills | Status: DC
  Filled 2023-04-20 – 2023-07-13 (×2): qty 90, 90d supply, fill #0
  Filled 2024-01-20 – 2024-03-18 (×2): qty 90, 90d supply, fill #1

## 2023-04-20 MED ORDER — CELECOXIB 200 MG PO CAPS
200.0000 mg | ORAL_CAPSULE | Freq: Two times a day (BID) | ORAL | 1 refills | Status: DC
  Filled 2023-04-20 – 2023-07-14 (×3): qty 180, 90d supply, fill #0
  Filled 2023-12-01: qty 180, 90d supply, fill #1

## 2023-05-05 ENCOUNTER — Other Ambulatory Visit: Payer: Self-pay

## 2023-05-05 ENCOUNTER — Other Ambulatory Visit (HOSPITAL_COMMUNITY)
Admission: RE | Admit: 2023-05-05 | Discharge: 2023-05-05 | Disposition: A | Discharge status: Home / Self Care | Payer: Self-pay | Source: Ambulatory Visit | Attending: Obstetrics and Gynecology | Admitting: Obstetrics and Gynecology

## 2023-05-05 ENCOUNTER — Other Ambulatory Visit: Payer: Self-pay | Admitting: Hematology and Oncology

## 2023-05-05 DIAGNOSIS — B3731 Acute candidiasis of vulva and vagina: Secondary | ICD-10-CM

## 2023-05-05 DIAGNOSIS — Z124 Encounter for screening for malignant neoplasm of cervix: Secondary | ICD-10-CM

## 2023-05-05 MED ORDER — FLUCONAZOLE 150 MG PO TABS
150.0000 mg | ORAL_TABLET | Freq: Every day | ORAL | 0 refills | Status: DC
Start: 2023-05-05 — End: 2024-02-22
  Filled 2023-05-05: qty 2, 2d supply, fill #0

## 2023-05-05 NOTE — Progress Notes (Unsigned)
Patient: Frances Martin           Date of Birth: 1961-03-21           MRN: [id number] Visit Date: 05/05/2023 PCP: Grayce Sessions, NP     Cervical Exam Pap smear completed: Pap test Abnormal Observations: Normal exam Recommendations: Will no longer need Pap smears after today as long as normal.      Patient's History Patient Active Problem List   Diagnosis Date Noted  . OSA (obstructive sleep apnea) 03/11/2019  . Hemorrhoids 08/21/2017   Past Medical History:  Diagnosis Date  . Hemorrhoids   . Hyperlipidemia   . Hypertension   . Pre-diabetes     Family History  Problem Relation Age of Onset  . Diabetes Mother   . Hypertension Mother   . Diabetes Father   . Breast cancer Neg Hx     Social History   Occupational History  . Not on file  Tobacco Use  . Smoking status: Former    Current packs/day: 0.50    Average packs/day: 0.5 packs/day for 2.7 years (1.4 ttl pk-yrs)    Types: Cigarettes    Start date: 2022    Quit date: 1979  . Smokeless tobacco: Never  . Tobacco comments:    3 a day  Vaping Use  . Vaping status: Never Used  Substance and Sexual Activity  . Alcohol use: No  . Drug use: No  . Sexual activity: Not Currently    Birth control/protection: Surgical

## 2023-05-08 LAB — CERVICOVAGINAL ANCILLARY ONLY
Bacterial Vaginitis (gardnerella): NEGATIVE
Candida Glabrata: NEGATIVE
Candida Vaginitis: NEGATIVE
Comment: NEGATIVE
Comment: NEGATIVE
Comment: NEGATIVE
Comment: NEGATIVE
Trichomonas: NEGATIVE

## 2023-05-09 LAB — CYTOLOGY - PAP
Adequacy: ABSENT
Comment: NEGATIVE
Diagnosis: NEGATIVE
High risk HPV: NEGATIVE

## 2023-05-10 ENCOUNTER — Telehealth: Payer: Self-pay

## 2023-05-10 NOTE — Telephone Encounter (Signed)
Called patient via Natale Lay, [employer] to give pap smear results. Informed patient that pap smear was normal and HPV was negative. Based on this result and her age she will no longer need pap smears. Patient voiced understanding. Wet prep also negative.

## 2023-05-16 LAB — FECAL OCCULT BLOOD, IMMUNOCHEMICAL: Fecal Occult Bld: NEGATIVE

## 2023-05-23 ENCOUNTER — Other Ambulatory Visit: Payer: Self-pay

## 2023-05-26 ENCOUNTER — Other Ambulatory Visit: Payer: Self-pay

## 2023-05-26 ENCOUNTER — Encounter (INDEPENDENT_AMBULATORY_CARE_PROVIDER_SITE_OTHER): Payer: Self-pay | Admitting: Primary Care

## 2023-05-26 ENCOUNTER — Ambulatory Visit (INDEPENDENT_AMBULATORY_CARE_PROVIDER_SITE_OTHER): Payer: Self-pay | Admitting: Primary Care

## 2023-05-26 VITALS — BP 120/82 | HR 60 | Resp 16 | Wt 197.2 lb

## 2023-05-26 DIAGNOSIS — Z23 Encounter for immunization: Secondary | ICD-10-CM

## 2023-05-26 DIAGNOSIS — E1169 Type 2 diabetes mellitus with other specified complication: Secondary | ICD-10-CM

## 2023-05-26 DIAGNOSIS — E119 Type 2 diabetes mellitus without complications: Secondary | ICD-10-CM

## 2023-05-26 DIAGNOSIS — F32A Depression, unspecified: Secondary | ICD-10-CM

## 2023-05-26 DIAGNOSIS — E785 Hyperlipidemia, unspecified: Secondary | ICD-10-CM

## 2023-05-26 DIAGNOSIS — F5101 Primary insomnia: Secondary | ICD-10-CM

## 2023-05-26 DIAGNOSIS — Z7984 Long term (current) use of oral hypoglycemic drugs: Secondary | ICD-10-CM

## 2023-05-26 DIAGNOSIS — Z760 Encounter for issue of repeat prescription: Secondary | ICD-10-CM

## 2023-05-26 LAB — POCT GLYCOSYLATED HEMOGLOBIN (HGB A1C): HbA1c, POC (controlled diabetic range): 8.5 % — AB (ref 0.0–7.0)

## 2023-05-26 MED ORDER — METFORMIN HCL ER (OSM) 1000 MG PO TB24
1000.0000 mg | ORAL_TABLET | Freq: Two times a day (BID) | ORAL | 1 refills | Status: DC
  Filled 2023-05-26: qty 180, 90d supply, fill #0

## 2023-05-26 MED ORDER — METFORMIN HCL ER (MOD) 1000 MG PO TB24
1000.0000 mg | ORAL_TABLET | Freq: Two times a day (BID) | ORAL | 1 refills | Status: DC
  Filled 2023-05-26: qty 180, 90d supply, fill #0

## 2023-05-26 MED ORDER — GLIPIZIDE 10 MG PO TABS
10.0000 mg | ORAL_TABLET | Freq: Two times a day (BID) | ORAL | 1 refills | Status: DC
Start: 2023-05-26 — End: 2023-06-22
  Filled 2023-05-26: qty 180, 90d supply, fill #0

## 2023-05-26 MED ORDER — METFORMIN HCL ER 500 MG PO TB24
500.0000 mg | ORAL_TABLET | Freq: Two times a day (BID) | ORAL | 1 refills | Status: DC
  Filled 2023-05-26: qty 180, 90d supply, fill #0
  Filled 2023-06-15: qty 60, 30d supply, fill #0
  Filled 2023-07-27: qty 180, 90d supply, fill #0
  Filled 2023-09-17 – 2023-09-18 (×2): qty 180, 90d supply, fill #1

## 2023-05-26 MED ORDER — BUPROPION HCL ER (SR) 150 MG PO TB12
150.0000 mg | ORAL_TABLET | Freq: Two times a day (BID) | ORAL | 1 refills | Status: DC
Start: 2023-05-26 — End: 2023-10-25
  Filled 2023-05-26: qty 180, 90d supply, fill #0
  Filled 2023-06-15: qty 60, 30d supply, fill #0

## 2023-05-26 NOTE — Progress Notes (Signed)
[facility]  Frances Martin, is a 62 y.o. female  YSA:[id number]  NAT:[id number]  DOB - May 11, 1961  Chief Complaint  Patient presents with   Diabetes       Subjective:   Frances Martin is a 62 y.o. Hispanic female ( interpreter Simonne Come [phone] today for a follow up visit for T2D. Denies polyuria, polydipsia, polyphasia or vision changes.  Does not check blood sugars at home.   No problems updated.  No Known Allergies  Past Medical History:  Diagnosis Date   Hemorrhoids    Hyperlipidemia    Hypertension    Pre-diabetes     Current Outpatient Medications on File Prior to Visit  Medication Sig Dispense Refill   buPROPion (WELLBUTRIN SR) 150 MG 12 hr tablet Take 1 tablet (150 mg total) by mouth twice daily in the morning and at bedtime.Please schedule appointment. 60 tablet 1   celecoxib (CELEBREX) 200 MG capsule Take 1 capsule (200 mg total) by mouth 2 (two) times daily. 180 capsule 1   Glucosamine 500 MG CAPS Take 1 capsule (500 mg total) by mouth daily. 90 capsule 1   hydrochlorothiazide (HYDRODIURIL) 25 MG tablet Take 1 tablet (25 mg total) by mouth daily. 30 tablet 1   losartan (COZAAR) 50 MG tablet Take 1 tablet (50 mg total) by mouth daily. 90 tablet 1   metFORMIN (GLUCOPHAGE-XR) 500 MG 24 hr tablet Take 1 tablet (500 mg total) by mouth in the morning and at bedtime. 180 tablet 1   pantoprazole (PROTONIX) 40 MG tablet Take 1 tablet (40 mg total) by mouth daily. 90 tablet 2   pravastatin (PRAVACHOL) 80 MG tablet Take 1 tablet (80 mg total) by mouth daily. (Patient not taking: Reported on 02/28/2023) 90 tablet 3   No current facility-administered medications on file prior to visit.    Objective:  BP 120/82   Pulse 60   Resp 16   Wt 197 lb 3.2 oz (89.4 kg)   SpO2 99%   BMI 34.38 kg/m   Comprehensive ROS Pertinent positive and negative noted in HPI   Exam General appearance : Awake, alert, not in any distress. Speech Clear. Not toxic  looking HEENT: Atraumatic and Normocephalic, pupils equally reactive to light and accomodation Neck: Supple, no JVD. No cervical lymphadenopathy.  Chest: Good air entry bilaterally, no added sounds  CVS: S1 S2 regular, no murmurs.  Abdomen: Bowel sounds present, Non tender and not distended with no gaurding, rigidity or rebound. Extremities: B/L Lower Ext shows no edema, both legs are warm to touch Neurology: Awake alert, and oriented X 3, CN II-XII intact, Non focal Skin: No Rash  Data Review Lab Results  Component Value Date   HGBA1C 8.5 (A) 05/26/2023   HGBA1C 8.0 (H) 11/16/2022   HGBA1C 6.7 (A) 02/25/2022    Assessment & Plan  Suri "Frances Martin" was seen today for diabetes.  Diagnoses and all orders for this visit:  Type 2 diabetes mellitus without complication, without long-term current use of insulin (HCC) -     POCT glycosylated hemoglobin (Hb A1C) 8.5  Complications from uncontrolled diabetes -diabetic retinopathy leading to blindness, diabetic nephropathy leading to dialysis, decrease in circulation decrease in sores or wound healing which may lead to amputations and increase of heart attack and stroke (started crying refusing insulin or GLP1 she has 3 months to improve A1C than discuss option) -     Discontinue: metformin (FORTAMET) 1000 MG (OSM) 24 hr tablet; Take 1 tablet (1,000 mg total) by mouth  2 (two) times daily with a meal. -     metFORMIN (GLUMETZA) 1000 MG (MOD) 24 hr tablet; Take 1 tablet (1,000 mg total) by mouth 2 (two) times daily with a meal. -     glipiZIDE (GLUCOTROL) 10 MG tablet; Take 1 tablet (10 mg total) by mouth 2 (two) times daily before a meal.  Encounter for immunization -     Varicella-zoster vaccine IM  Primary insomnia -     buPROPion (WELLBUTRIN SR) 150 MG 12 hr tablet; Take 1 tablet (150 mg total) by mouth twice daily in the morning and at bedtime.Please schedule appointment.  Medication refill -     buPROPion (WELLBUTRIN SR) 150 MG  12 hr tablet; Take 1 tablet (150 mg total) by mouth twice daily in the morning and at bedtime.Please schedule appointment. -     metFORMIN (GLUMETZA) 1000 MG (MOD) 24 hr tablet; Take 1 tablet (1,000 mg total) by mouth 2 (two) times daily with a meal.  Depression, unspecified depression type -     buPROPion (WELLBUTRIN SR) 150 MG 12 hr tablet; Take 1 tablet (150 mg total) by mouth twice daily in the morning and at bedtime.Please schedule appointment.     Patient have been counseled extensively about nutrition and exercise. Other issues discussed during this visit include: low cholesterol diet, weight control and daily exercise, foot care, annual eye examinations at Ophthalmology, importance of adherence with medications and regular follow-up. We also discussed long term complications of uncontrolled diabetes and hypertension.   Return in about 3 months (around 08/26/2023).  The patient was given clear instructions to go to ER or return to medical center if symptoms don't improve, worsen or new problems develop. The patient verbalized understanding. The patient was told to call to get lab results if they haven't heard anything in the next week.   This note has been created with Education officer, environmental. Any transcriptional errors are unintentional.   Grayce Sessions, NP 05/26/2023, 10:13 AM

## 2023-05-26 NOTE — Addendum Note (Signed)
Addended by: Gwinda Passe on: 05/26/2023 10:20 AM   Modules accepted: Orders

## 2023-05-26 NOTE — Patient Instructions (Signed)
Zoster Vaccine Injection What is this medication? ZOSTER VACCINE (ZOS ter vak SEEN) reduces the risk of herpes zoster (shingles). It does not treat shingles. It is still possible to get shingles after receiving the vaccine, but the symptoms may be less severe or not last as long. It works by helping your immune system learn how to fight off a future infection. This medicine may be used for other purposes; ask your health care provider or pharmacist if you have questions. COMMON BRAND NAME(S): [facility] What should I tell my care team before I take this medication? They need to know if you have any of these conditions: Cancer Immune system problems An unusual or allergic reaction to Zoster vaccine, other medications, foods, dyes, or preservatives Pregnant or trying to get pregnant Breastfeeding How should I use this medication? This vaccine is injected into a muscle. It is given by your care team. This vaccine requires 2 doses to get the full benefit. Set a reminder for when your next dose is due. A copy of Vaccine Information Statements will be given before each vaccination. Be sure to read this information carefully each time. This sheet may change often. Talk to your care team about the use of this vaccine in children. This vaccine is not approved for use in children. Overdosage: If you think you have taken too much of this medicine contact a poison control center or emergency room at once. NOTE: This medicine is only for you. Do not share this medicine with others. What if I miss a dose? Keep appointments for follow-up (booster) doses. It is important not to miss your dose. Call your care team if you are unable to keep an appointment. What may interact with this medication? Medications that suppress your immune system Medications to treat cancer Steroid medications, such as prednisone or cortisone This list may not describe all possible interactions. Give your health care provider a list  of all the medicines, herbs, non-prescription drugs, or dietary supplements you use. Also tell them if you smoke, drink alcohol, or use illegal drugs. Some items may interact with your medicine. What should I watch for while using this medication? Visit your care team regularly. This vaccine, like all vaccines, may not fully protect everyone. What side effects may I notice from receiving this medication? Side effects that you should report to your care team as soon as possible: Allergic reactions--skin rash, itching, hives, swelling of the face, lips, tongue, or throat Side effects that usually do not require medical attention (report these to your care team if they continue or are bothersome): Chills Fatigue Feeling faint or lightheaded Fever Headache Muscle pain Pain, redness, or irritation at injection site This list may not describe all possible side effects. Call your doctor for medical advice about side effects. You may report side effects to FDA at 1-800-FDA-1088. Where should I keep my medication? This vaccine is only given by your care team. It will not be stored at home. NOTE: This sheet is a summary. It may not cover all possible information. If you have questions about this medicine, talk to your doctor, pharmacist, or health care provider.  2024 Elsevier/Gold Standard (2022-01-20 00:00:00)

## 2023-05-26 NOTE — Addendum Note (Signed)
Addended by: Aleen Sells R on: 05/26/2023 11:15 AM   Modules accepted: Orders

## 2023-05-29 ENCOUNTER — Other Ambulatory Visit (INDEPENDENT_AMBULATORY_CARE_PROVIDER_SITE_OTHER): Payer: Self-pay

## 2023-05-29 DIAGNOSIS — I10 Essential (primary) hypertension: Secondary | ICD-10-CM

## 2023-05-29 DIAGNOSIS — E1169 Type 2 diabetes mellitus with other specified complication: Secondary | ICD-10-CM

## 2023-05-29 DIAGNOSIS — E119 Type 2 diabetes mellitus without complications: Secondary | ICD-10-CM

## 2023-05-29 DIAGNOSIS — Z7984 Long term (current) use of oral hypoglycemic drugs: Secondary | ICD-10-CM

## 2023-05-30 LAB — CBC WITH DIFFERENTIAL/PLATELET
Basophils Absolute: 0.1 10*3/uL (ref 0.0–0.2)
Basos: 1 %
EOS (ABSOLUTE): 0.2 10*3/uL (ref 0.0–0.4)
Eos: 2 %
Hematocrit: 41.7 % (ref 34.0–46.6)
Hemoglobin: 13.6 g/dL (ref 11.1–15.9)
Immature Grans (Abs): 0 10*3/uL (ref 0.0–0.1)
Immature Granulocytes: 0 %
Lymphocytes Absolute: 3.1 10*3/uL (ref 0.7–3.1)
Lymphs: 41 %
MCH: 29.4 pg (ref 26.6–33.0)
MCHC: 32.6 g/dL (ref 31.5–35.7)
MCV: 90 fL (ref 79–97)
Monocytes Absolute: 0.6 10*3/uL (ref 0.1–0.9)
Monocytes: 7 %
Neutrophils Absolute: 3.8 10*3/uL (ref 1.4–7.0)
Neutrophils: 49 %
Platelets: 324 10*3/uL (ref 150–450)
RBC: 4.62 x10E6/uL (ref 3.77–5.28)
RDW: 14 % (ref 11.7–15.4)
WBC: 7.7 10*3/uL (ref 3.4–10.8)

## 2023-05-30 LAB — LIPID PANEL
Chol/HDL Ratio: 3.4 {ratio} (ref 0.0–4.4)
Cholesterol, Total: 185 mg/dL (ref 100–199)
HDL: 55 mg/dL (ref >39)
LDL Chol Calc (NIH): 108 mg/dL — ABNORMAL HIGH (ref 0–99)
Triglycerides: 124 mg/dL (ref 0–149)
VLDL Cholesterol Cal: 22 mg/dL (ref 5–40)

## 2023-05-30 LAB — CMP14+EGFR
ALT: 95 [IU]/L — ABNORMAL HIGH (ref 0–32)
AST: 36 [IU]/L (ref 0–40)
Albumin: 4.2 g/dL (ref 3.9–4.9)
Alkaline Phosphatase: 78 [IU]/L (ref 44–121)
BUN/Creatinine Ratio: 26 (ref 12–28)
BUN: 21 mg/dL (ref 8–27)
Bilirubin Total: 0.2 mg/dL (ref 0.0–1.2)
CO2: 24 mmol/L (ref 20–29)
Calcium: 9.2 mg/dL (ref 8.7–10.3)
Chloride: 102 mmol/L (ref 96–106)
Creatinine, Ser: 0.81 mg/dL (ref 0.57–1.00)
Globulin, Total: 3.3 g/dL (ref 1.5–4.5)
Glucose: 145 mg/dL — ABNORMAL HIGH (ref 70–99)
Potassium: 4.7 mmol/L (ref 3.5–5.2)
Sodium: 141 mmol/L (ref 134–144)
Total Protein: 7.5 g/dL (ref 6.0–8.5)
eGFR: 83 mL/min/{1.73_m2} (ref >59)

## 2023-05-31 ENCOUNTER — Other Ambulatory Visit (INDEPENDENT_AMBULATORY_CARE_PROVIDER_SITE_OTHER): Payer: Self-pay | Admitting: Primary Care

## 2023-06-15 ENCOUNTER — Other Ambulatory Visit: Payer: Self-pay

## 2023-06-19 ENCOUNTER — Other Ambulatory Visit (INDEPENDENT_AMBULATORY_CARE_PROVIDER_SITE_OTHER): Payer: Self-pay | Admitting: Primary Care

## 2023-06-19 ENCOUNTER — Other Ambulatory Visit: Payer: Self-pay

## 2023-06-19 DIAGNOSIS — I10 Essential (primary) hypertension: Secondary | ICD-10-CM

## 2023-06-19 DIAGNOSIS — Z760 Encounter for issue of repeat prescription: Secondary | ICD-10-CM

## 2023-06-19 MED ORDER — HYDROCHLOROTHIAZIDE 25 MG PO TABS
25.0000 mg | ORAL_TABLET | Freq: Every day | ORAL | 1 refills | Status: DC
  Filled 2023-06-19: qty 30, 30d supply, fill #0
  Filled 2023-07-13: qty 30, 30d supply, fill #1

## 2023-06-21 NOTE — Progress Notes (Signed)
S:    InterpreterChase Caller ID [id number] 62 y.o. female who presents for diabetes evaluation, education, and management. Patient arrives in good spirits and presents without any assistance.   Patient was referred and last seen by Primary Care Provider, Gwinda Passe, on 05/26/23. At this visit, glipizide was initiated and metformin was continued. Patient declined insulin and GLP-1 at that time. This is the patient's first visit with a pharmacist.  Today, patient states she has not been taking the glipizide as she was concerned about the hypoglycemia warning on the bottle. She did take a glipizide dose this morning due to being concerned about a BG of 171, which is high for her. She states she has been improving her diet and has lost weight. She does mention she wants to limit medications.   Asked patient about hesitancy towards insulin and GLP1s as reported during PCP visit. Patient notes her mother used to use insulin and she doesn't like the thought of having to give injections in her stomach.  PMH is significant for T2DM, HLD, HTN.  No history of ASCVD, CKD, or HF. No history of pancreatitis or thyroid cancer. Has not been hospitalized for DM.  Patient reports Diabetes was diagnosed in 2022. Previously was prediabetic.  Family/Social History: Fhx: DM (both parents), HTN Tobacco: former smoker (stopped 1.5 years ago) Alcohol: none  Current diabetes medications: Glipizide 10mg  BID (only has taken one dose, which was this AM) Metformin XR 1000mg  BID with (takes 500mg  TID as her head hurt when she took two tablets at once) No previous diabetes medications noted. No longer on statin therapy.  Insurance coverage: None  Patient denies hypoglycemic events.  Reported home fasting blood sugars: just started checking this week because just got meter BG this AM around 171 Others: 125,135 Reported 2 hour post-meal/random blood sugars: goes up about 20 from FBG (ex 135 >  155).  Patient reports frequent urination, which she attributes how much water she drinks per day. Patient denies neuropathy (nerve pain). Patient denies visual changes. Patient reports self foot exams.   Patient reported dietary habits: Eats 3 meals/day Breakfast: eggs, spinach, cheese, half avocado, 1 slice whole bread, coffee with almond milk and stevia Lunch: lentils, veggies with proteins, fruit (pear, orange, grapefruit) Dinner: eggs, spinach, veggies, fruit, homeade bread, greek yogurt Snacks: not often, maybe popcorn or pistachios Drinks: water (6 bottles/day), [place] water (2 jugs/day), coffee (1x/day), no sugar beverages  Patient-reported exercise habits:  Gym 3x/week for 2 hours  O:  Lab Results  Component Value Date   HGBA1C 8.5 (A) 05/26/2023   There were no vitals filed for this visit.  Lipid Panel     Component Value Date/Time   CHOL 185 05/29/2023 0839   TRIG 124 05/29/2023 0839   HDL 55 05/29/2023 0839   CHOLHDL 3.4 05/29/2023 0839   LDLCALC 108 (H) 05/29/2023 0839    Clinical Atherosclerotic Cardiovascular Disease (ASCVD): No  The 10-year ASCVD risk score (Arnett DK, et al., 2019) is: 7.8%   Values used to calculate the score:     Age: 63 years     Sex: Female     Is Non-Hispanic African American: No     Diabetic: Yes     Tobacco smoker: No     Systolic Blood Pressure: 120 mmHg     Is BP treated: Yes     HDL Cholesterol: 55 mg/dL     Total Cholesterol: 185 mg/dL   A/P: Diabetes longstanding currently uncontrolled based on  most recent A1c of 8.5% (05/2023), which has been trending up from 6.2% since 07/2021. However, self-reported CBGs are fairly well-controlled and suggest improvement. Current metformin dose is non-conventional, but it is appropriate given this is the highest dose she can tolerate. Patient has well-controlled CBG without the glipizide, so will discontinue glipizide. As patient stated she has been improving her lifestyle, I do not  feel there is a need to further adjust therapy at this time. Patient is able to verbalize appropriate hypoglycemia management plan. Medication adherence appears appropriate. Control is suboptimal due to limited time on increased metformin dose. Could consider addition of GLP-1 or SGLT2i in the future if needed. As patient was not familiar with GLP-1s, provided education on their mechanism, additional benefits, and how these could be a good option in the future to limit medication use. Patient voiced understanding. -Continue metformin XR 500mg  TID  -Discontinue glipizide -Extensively discussed pathophysiology of diabetes, recommended lifestyle interventions, dietary effects on blood sugar control.  -Counseled on s/sx of and management of hypoglycemia.  -Next A1c anticipated 08/2023.   Additionally, most recent LDL 108 but PCP recently discontinued statin therapy. Did not have sufficient time today to discuss reason for discontinuation, so may consider revisiting in the future. Of note, most recent ALT was elevated and has been trending up.  Written patient instructions provided. Patient verbalized understanding of treatment plan.  Total time in face to face counseling 60 minutes.    Follow-up:  Pharmacist: 07/27/23 PCP clinic visit: 08/28/23  Nicole Kindred, PharmD PGY1 Pharmacy Resident 06/22/2023 11:28 AM

## 2023-06-22 ENCOUNTER — Ambulatory Visit: Payer: Self-pay | Attending: Primary Care | Admitting: Pharmacist

## 2023-06-22 ENCOUNTER — Other Ambulatory Visit: Payer: Self-pay

## 2023-06-22 ENCOUNTER — Encounter: Payer: Self-pay | Admitting: Pharmacist

## 2023-06-22 DIAGNOSIS — E1169 Type 2 diabetes mellitus with other specified complication: Secondary | ICD-10-CM

## 2023-06-22 DIAGNOSIS — E785 Hyperlipidemia, unspecified: Secondary | ICD-10-CM

## 2023-06-22 DIAGNOSIS — E119 Type 2 diabetes mellitus without complications: Secondary | ICD-10-CM | POA: Insufficient documentation

## 2023-06-22 DIAGNOSIS — Z7984 Long term (current) use of oral hypoglycemic drugs: Secondary | ICD-10-CM

## 2023-07-14 ENCOUNTER — Other Ambulatory Visit: Payer: Self-pay

## 2023-07-21 ENCOUNTER — Encounter (INDEPENDENT_AMBULATORY_CARE_PROVIDER_SITE_OTHER): Payer: Self-pay | Admitting: Primary Care

## 2023-07-21 ENCOUNTER — Ambulatory Visit (INDEPENDENT_AMBULATORY_CARE_PROVIDER_SITE_OTHER): Payer: Self-pay | Admitting: Primary Care

## 2023-07-21 ENCOUNTER — Other Ambulatory Visit: Payer: Self-pay

## 2023-07-21 VITALS — BP 125/77 | HR 84 | Resp 16 | Wt 189.2 lb

## 2023-07-21 DIAGNOSIS — N3000 Acute cystitis without hematuria: Secondary | ICD-10-CM

## 2023-07-21 LAB — POCT URINALYSIS DIP (CLINITEK)
Bilirubin, UA: NEGATIVE
Blood, UA: NEGATIVE
Glucose, UA: NEGATIVE mg/dL
Ketones, POC UA: NEGATIVE mg/dL
Nitrite, UA: NEGATIVE
POC PROTEIN,UA: 30 — AB
Spec Grav, UA: 1.02 (ref 1.010–1.025)
Urobilinogen, UA: 0.2 U/dL
pH, UA: 7 (ref 5.0–8.0)

## 2023-07-21 MED ORDER — SULFAMETHOXAZOLE-TRIMETHOPRIM 800-160 MG PO TABS
1.0000 | ORAL_TABLET | Freq: Two times a day (BID) | ORAL | 0 refills | Status: DC
Start: 2023-07-21 — End: 2023-08-28
  Filled 2023-07-21: qty 20, 10d supply, fill #0

## 2023-07-21 MED ORDER — FLUCONAZOLE 150 MG PO TABS
150.0000 mg | ORAL_TABLET | Freq: Every day | ORAL | 1 refills | Status: DC
Start: 2023-07-21 — End: 2023-08-28
  Filled 2023-07-21: qty 1, 1d supply, fill #0

## 2023-07-21 NOTE — Progress Notes (Unsigned)
[facility]  Aysia Bertone, is a 62 y.o. female  ZDG:[id number]  RJJ:[id number]  DOB - April 30, 1961  Chief Complaint  Patient presents with   Abdominal Pain    Pain starts on the right lower side and travels to the back      Shanda Bumps [id number]  Subjective:   Terrina Kun is a 62 y.o. female here today for a follow up visit. Patient has No headache, No chest pain, No abdominal pain - No Nausea, No new weakness tingling or numbness, No Cough - shortness of breath Abdominal Pain This is a new problem. The current episode started in the past 7 days. The onset quality is gradual. The problem occurs intermittently. The problem has been gradually worsening. The pain is located in the LLQ, periumbilical region, right flank and left flank. The pain is at a severity of 9/10. The quality of the pain is aching, sharp, burning and cramping. The abdominal pain does not radiate. Associated symptoms include dysuria. The pain is aggravated by urination. Relieved by: Tylenol. She has tried acetaminophen for the symptoms. The treatment provided moderate relief.    No problems updated.  Comprehensive ROS Pertinent positive and negative noted in HPI   No Known Allergies  Past Medical History:  Diagnosis Date   Hemorrhoids    Hyperlipidemia    Hypertension    Pre-diabetes     Current Outpatient Medications on File Prior to Visit  Medication Sig Dispense Refill   buPROPion (WELLBUTRIN SR) 150 MG 12 hr tablet Take 1 tablet (150 mg total) by mouth twice daily in the morning and at bedtime.Please schedule appointment. 180 tablet 1   celecoxib (CELEBREX) 200 MG capsule Take 1 capsule (200 mg total) by mouth 2 (two) times daily. 180 capsule 1   hydrochlorothiazide (HYDRODIURIL) 25 MG tablet Take 1 tablet (25 mg total) by mouth daily. 30 tablet 1   losartan (COZAAR) 50 MG tablet Take 1 tablet (50 mg total) by mouth daily. 90 tablet 1   metFORMIN (GLUCOPHAGE-XR) 500 MG 24 hr  tablet Take 1 tablet (500 mg total) by mouth 2 (two) times daily with a meal. 180 tablet 1   pantoprazole (PROTONIX) 40 MG tablet Take 1 tablet (40 mg total) by mouth daily. 90 tablet 2   No current facility-administered medications on file prior to visit.   Health Maintenance  Topic Date Due   Complete foot exam   Never done   Eye exam for diabetics  Never done   Flu Shot  03/16/2023   COVID-19 Vaccine (1 - 2023-24 season) Never done   Yearly kidney health urinalysis for diabetes  11/16/2023   Hemoglobin A1C  11/24/2023   Yearly kidney function blood test for diabetes  05/28/2024   Mammogram  02/08/2025   DTaP/Tdap/Td vaccine (2 - Td or Tdap) 04/14/2027   Colon Cancer Screening  01/23/2028   Pap with HPV screening  05/04/2028   Hepatitis C Screening  Completed   HIV Screening  Completed   Zoster (Shingles) Vaccine  Completed   HPV Vaccine  Aged Out    Objective:   Vitals:   07/21/23 1040  BP: 125/77  Pulse: 84  Resp: 16  SpO2: 95%  Weight: 189 lb 3.2 oz (85.8 kg)   {Vitals History (Optional):23777}  Physical Exam  {Labs (Optional):23779}  Assessment & Plan  There are no diagnoses linked to this encounter.   Patient have been counseled extensively about nutrition and exercise. Other issues discussed during this visit include: low cholesterol  diet, weight control and daily exercise, foot care, annual eye examinations at Ophthalmology, importance of adherence with medications and regular follow-up. We also discussed long term complications of uncontrolled diabetes and hypertension.   No follow-ups on file.  The patient was given clear instructions to go to ER or return to medical center if symptoms don't improve, worsen or new problems develop. The patient verbalized understanding. The patient was told to call to get lab results if they haven't heard anything in the next week.   This note has been created with Personnel officer. Any transcriptional errors are unintentional.   Grayce Sessions, NP 07/21/2023, 10:46 AM

## 2023-07-27 ENCOUNTER — Other Ambulatory Visit: Payer: Self-pay

## 2023-07-27 ENCOUNTER — Encounter: Payer: Self-pay | Admitting: Pharmacist

## 2023-07-27 ENCOUNTER — Other Ambulatory Visit (INDEPENDENT_AMBULATORY_CARE_PROVIDER_SITE_OTHER): Payer: Self-pay | Admitting: Primary Care

## 2023-07-27 ENCOUNTER — Ambulatory Visit: Payer: Self-pay | Attending: Primary Care | Admitting: Pharmacist

## 2023-07-27 DIAGNOSIS — Z7984 Long term (current) use of oral hypoglycemic drugs: Secondary | ICD-10-CM

## 2023-07-27 DIAGNOSIS — E119 Type 2 diabetes mellitus without complications: Secondary | ICD-10-CM

## 2023-07-27 DIAGNOSIS — N3000 Acute cystitis without hematuria: Secondary | ICD-10-CM

## 2023-07-27 DIAGNOSIS — R300 Dysuria: Secondary | ICD-10-CM

## 2023-07-27 LAB — POCT URINALYSIS DIP (CLINITEK)
Bilirubin, UA: NEGATIVE
Blood, UA: NEGATIVE
Glucose, UA: NEGATIVE mg/dL
Ketones, POC UA: NEGATIVE mg/dL
POC PROTEIN,UA: NEGATIVE
Spec Grav, UA: 1.015 (ref 1.010–1.025)
Urobilinogen, UA: 0.2 U/dL
pH, UA: 6.5 (ref 5.0–8.0)

## 2023-07-27 MED ORDER — GLIPIZIDE ER 5 MG PO TB24
5.0000 mg | ORAL_TABLET | Freq: Every day | ORAL | 1 refills | Status: DC
  Filled 2023-07-27: qty 90, 90d supply, fill #0

## 2023-07-27 NOTE — Progress Notes (Signed)
S:    InterpreterMargurite Martin ID [phone]  62 y.o. female who presents for diabetes evaluation, education, and management. Patient arrives in good spirits and presents without any assistance.   Patient was originally referred by Primary Care Provider, Frances Martin, on 05/26/23. We saw her 06/22/2023 and stopped glipizide. Today, patient states that her sugars have increased since last visit. Also, she has been taking Bactrim d/t a recent UTI, started therapy on 07/21/2023. She tells me today that she has not felt any better. Describes worsening left flank and lower back pain.  PMH is significant for T2DM, HLD, HTN.  No history of ASCVD, CKD, or HF. No history of pancreatitis or thyroid cancer. Has not been hospitalized for DM.  Family/Social History: Fhx: DM (both parents), HTN Tobacco: former smoker (stopped 1.5 years ago) Alcohol: none  Current diabetes medications: Metformin XR 1000mg  BID with (takes as 500 mg XR TID d/t GI intolerance)  Insurance coverage: None  Patient denies hypoglycemic events.  Reported home fasting blood sugars: -Since last visit, she tells me they have been elevated since her last visit.  -Fasting: 137, 160 -2-hr PPD/random: "a little higher" than the morning. No readings >200 mg/dL.   Patient endorses frequent urination. Patient denies neuropathy (nerve pain). Patient denies visual changes. Patient reports self foot exams.   Patient reported dietary habits: Eats 3 meals/day Breakfast: eggs, spinach, cheese, half avocado, 1 slice whole bread, coffee with almond milk and stevia Lunch: lentils, veggies with proteins, fruit (pear, orange, grapefruit) Dinner: eggs, spinach, veggies, fruit, homeade bread, greek yogurt Snacks: not often, maybe popcorn or pistachios Drinks: water (6 bottles/day), [place] water (2 jugs/day), coffee (1x/day), no sugar beverages  Patient-reported exercise habits:  -Gym 2 days out of the week for 2 hours -Uses stationary  bike at home  O:  Lab Results  Component Value Date   HGBA1C 8.5 (A) 05/26/2023   There were no vitals filed for this visit.  Lipid Panel     Component Value Date/Time   CHOL 185 05/29/2023 0839   TRIG 124 05/29/2023 0839   HDL 55 05/29/2023 0839   CHOLHDL 3.4 05/29/2023 0839   LDLCALC 108 (H) 05/29/2023 0839    Clinical Atherosclerotic Cardiovascular Disease (ASCVD): No  The 10-year ASCVD risk score (Arnett DK, et al., 2019) is: 8.4%   Values used to calculate the score:     Age: 35 years     Sex: Female     Is Non-Hispanic African American: No     Diabetic: Yes     Tobacco smoker: No     Systolic Blood Pressure: 125 mmHg     Is BP treated: Yes     HDL Cholesterol: 55 mg/dL     Total Cholesterol: 185 mg/dL   A/P: Diabetes longstanding currently uncontrolled based on most recent A1c of 8.5%. Self-reported CBGs are trending up. We will start low-dose XL glipizide. Will avoid SGLT-2i therapy given her recent and ongoing UTI. She still has an aversion to needles and injectable therapy. Current metformin dose is non-conventional, but it is appropriate given this is the highest dose she can tolerate. Patient is able to verbalize appropriate hypoglycemia management plan. Medication adherence appears appropriate. -Continue metformin XR 500mg  TID  -Start glipizide 5 mg XL daily.  -Extensively discussed pathophysiology of diabetes, recommended lifestyle interventions, dietary effects on blood sugar control.  -Counseled on s/sx of and management of hypoglycemia.  -Next A1c anticipated 08/2023.   Additionally, I messaged her PCP and got  authorization to perform a repeat UA with culture. We will be in-touch with the results.   Written patient instructions provided. Patient verbalized understanding of treatment plan.  Total time in face to face counseling 60 minutes.    Follow-up:  PCP clinic visit: 08/28/23  Butch Penny, PharmD, BCACP, CPP Clinical Pharmacist [facility]  & [facility] [phone]

## 2023-07-29 LAB — URINE CULTURE

## 2023-08-02 ENCOUNTER — Encounter (INDEPENDENT_AMBULATORY_CARE_PROVIDER_SITE_OTHER): Payer: Self-pay | Admitting: Primary Care

## 2023-08-17 ENCOUNTER — Other Ambulatory Visit: Payer: Self-pay

## 2023-08-28 ENCOUNTER — Other Ambulatory Visit: Payer: Self-pay

## 2023-08-28 ENCOUNTER — Encounter (INDEPENDENT_AMBULATORY_CARE_PROVIDER_SITE_OTHER): Payer: Self-pay | Admitting: Primary Care

## 2023-08-28 ENCOUNTER — Other Ambulatory Visit (INDEPENDENT_AMBULATORY_CARE_PROVIDER_SITE_OTHER): Payer: Self-pay | Admitting: Primary Care

## 2023-08-28 ENCOUNTER — Ambulatory Visit (INDEPENDENT_AMBULATORY_CARE_PROVIDER_SITE_OTHER): Payer: Self-pay | Admitting: Primary Care

## 2023-08-28 ENCOUNTER — Other Ambulatory Visit (INDEPENDENT_AMBULATORY_CARE_PROVIDER_SITE_OTHER): Payer: Self-pay

## 2023-08-28 VITALS — BP 136/69 | HR 88 | Resp 16 | Ht 63.5 in | Wt 192.2 lb

## 2023-08-28 DIAGNOSIS — Z7984 Long term (current) use of oral hypoglycemic drugs: Secondary | ICD-10-CM

## 2023-08-28 DIAGNOSIS — I10 Essential (primary) hypertension: Secondary | ICD-10-CM

## 2023-08-28 DIAGNOSIS — E1169 Type 2 diabetes mellitus with other specified complication: Secondary | ICD-10-CM

## 2023-08-28 DIAGNOSIS — Z760 Encounter for issue of repeat prescription: Secondary | ICD-10-CM

## 2023-08-28 DIAGNOSIS — E119 Type 2 diabetes mellitus without complications: Secondary | ICD-10-CM

## 2023-08-28 DIAGNOSIS — E785 Hyperlipidemia, unspecified: Secondary | ICD-10-CM

## 2023-08-28 DIAGNOSIS — J029 Acute pharyngitis, unspecified: Secondary | ICD-10-CM

## 2023-08-28 LAB — POCT GLYCOSYLATED HEMOGLOBIN (HGB A1C): HbA1c, POC (controlled diabetic range): 6.6 % (ref 0.0–7.0)

## 2023-08-28 MED ORDER — AMOXICILLIN-POT CLAVULANATE 875-125 MG PO TABS
1.0000 | ORAL_TABLET | Freq: Two times a day (BID) | ORAL | 0 refills | Status: DC
  Filled 2023-08-28: qty 20, 10d supply, fill #0

## 2023-08-28 MED ORDER — HYDROCHLOROTHIAZIDE 25 MG PO TABS
25.0000 mg | ORAL_TABLET | Freq: Every day | ORAL | 1 refills | Status: DC
  Filled 2023-08-28 – 2023-09-17 (×2): qty 90, 90d supply, fill #0

## 2023-08-28 NOTE — Progress Notes (Signed)
 [facility]  Frances Martin, is a 63 y.o. female  RDW:[id number]  [id number]  DOB - [birth year]  Chief Complaint  Patient presents with   Diabetes   Hypertension     Frances Martin interpreter   Subjective:   Frances Martin is a 63 y.o. female here today for a follow up visit. Patient has No headache, No chest pain, No abdominal pain - No Nausea, No new weakness tingling or numbness, No Cough - shortness of breath Diabetes She presents for her follow-up diabetic visit. She has type 2 diabetes mellitus. No MedicAlert identification noted. Her disease course has been stable. There are no hypoglycemic associated symptoms. There are no diabetic associated symptoms. There are no hypoglycemic complications. Symptoms are stable. Risk factors for coronary artery disease include hypertension, obesity, dyslipidemia and diabetes mellitus. Current diabetic treatment includes oral agent (dual therapy). She is compliant with treatment all of the time. Her weight is stable. She is following a diabetic, low salt and generally healthy diet. When asked about meal planning, she reported none. She has not had a previous visit with a dietitian. She participates in exercise daily. There is no change in her home blood glucose trend. Her breakfast blood glucose range is generally 130-140 mg/dl. Her lunch blood glucose range is generally 110-130 mg/dl. An ACE inhibitor/angiotensin II receptor blocker is being taken. She does not see a podiatrist.Eye exam is not current.  Hypertension  Sore Throat  This is a new problem. The current episode started 1 to 4 weeks ago. The problem has been gradually worsening. The pain is worse on the right (both) side. There has been no fever. The pain is at a severity of 9/10. The pain is moderate. Associated symptoms include coughing and ear pain. She has tried gargles (gargling in vinager) for the symptoms. The treatment provided mild relief.    No  problems updated.  Comprehensive ROS Pertinent positive and negative noted in HPI   No Known Allergies  Past Medical History:  Diagnosis Date   Hemorrhoids    Hyperlipidemia    Hypertension    Pre-diabetes     Current Outpatient Medications on File Prior to Visit  Medication Sig Dispense Refill   buPROPion  (WELLBUTRIN  SR) 150 MG 12 hr tablet Take 1 tablet (150 mg total) by mouth twice daily in the morning and at bedtime.Please schedule appointment. 180 tablet 1   celecoxib  (CELEBREX ) 200 MG capsule Take 1 capsule (200 mg total) by mouth 2 (two) times daily. 180 capsule 1   glipiZIDE  (GLUCOTROL  XL) 5 MG 24 hr tablet Take 1 tablet (5 mg total) by mouth daily with breakfast. 90 tablet 1   hydrochlorothiazide  (HYDRODIURIL ) 25 MG tablet Take 1 tablet (25 mg total) by mouth daily. 30 tablet 1   losartan  (COZAAR ) 50 MG tablet Take 1 tablet (50 mg total) by mouth daily. 90 tablet 1   metFORMIN  (GLUCOPHAGE -XR) 500 MG 24 hr tablet Take 1 tablet (500 mg total) by mouth 2 (two) times daily with a meal. 180 tablet 1   pantoprazole  (PROTONIX ) 40 MG tablet Take 1 tablet (40 mg total) by mouth daily. 90 tablet 2   No current facility-administered medications on file prior to visit.   Health Maintenance  Topic Date Due   Eye exam for diabetics  Never done   COVID-19 Vaccine (1 - 2024-25 season) Never done   Flu Shot  11/13/2023*   Yearly kidney health urinalysis for diabetes  11/16/2023   Hemoglobin A1C  02/25/2024  Yearly kidney function blood test for diabetes  05/28/2024   Complete foot exam   08/27/2024   Mammogram  02/08/2025   DTaP/Tdap/Td vaccine (2 - Td or Tdap) 04/14/2027   Colon Cancer Screening  01/23/2028   Pap with HPV screening  05/04/2028   Pneumococcal Vaccination  Completed   Hepatitis C Screening  Completed   HIV Screening  Completed   Zoster (Shingles) Vaccine  Completed   HPV Vaccine  Aged Out  *Topic was postponed. The date shown is not the original due date.     Objective:   Vitals:   08/28/23 0954 08/28/23 0956  BP: 133/86 136/69  Pulse: 88   Resp: 16   SpO2: 95%   Weight: 192 lb 3.2 oz (87.2 kg)   Height: 5' 3.5 (1.613 m)    BP Readings from Last 3 Encounters:  08/28/23 136/69  07/21/23 125/77  05/26/23 120/82      Last hemoglobin A1c Lab Results  Component Value Date   HGBA1C 6.6 08/28/2023    General: No apparent distress. Eyes: Extraocular eye movements intact, pupils equal and round. Neck: Supple, trachea midline.  Bilateral erythema, swollen tonsils and painful with palpitation Thyroid: No enlargement, mobile without fixation, no tenderness. Cardiovascular: Regular rhythm and rate, no murmur, normal radial pulses. Respiratory: Normal respiratory effort, clear to auscultation. Gastrointestinal: Normal pitch active bowel sounds, nontender abdomen without distention or appreciable hepatomegaly. Musculoskeletal: Normal muscle tone, no tenderness on palpation of tibia, no excessive thoracic kyphosis. Skin: Appropriate warmth, no visible rash. Mental status: Alert, conversant, speech clear, thought logical, appropriate mood and affect, no hallucinations or delusions evident. Hematologic/lymphatic: No cervical adenopathy, no visible ecchymoses.   Assessment & Plan  Frances Martin was seen today for diabetes and hypertension.  Diagnoses and all orders for this visit:  Type 2 diabetes mellitus without complication, without long-term current use of insulin (HCC) -     POCT glycosylated hemoglobin (Hb A1C) 6.6-remains diet and exercise control -     CMP14+EGFR -     CBC with Differential/Platelet  Essential hypertension Blood pressure is less than 140/90 which is at goal.   Explained that having normal blood pressure is the goal and medications are helping to get to goal and maintain normal blood pressure. DIET: Limit salt intake, read nutrition labels to check salt content, limit fried and high fatty foods  Avoid  using multisymptom OTC cold preparations that generally contain sudafed which can rise BP. Consult with pharmacist on best cold relief products to use for persons with HTN EXERCISE Discussed incorporating exercise such as walking - 30 minutes most days of the week and can do in 10 minute intervals    -     CMP14+EGFR -     CBC with Differential/Platelet  Hyperlipidemia associated with type 2 diabetes mellitus (HCC) -     Lipid panel  Acute pharyngitis, unspecified etiology -     amoxicillin -clavulanate (AUGMENTIN ) 875-125 MG tablet; Take 1 tablet by mouth 2 (two) times daily.    Patient have been counseled extensively about nutrition and exercise. Other issues discussed during this visit include: low cholesterol diet, weight control and daily exercise, foot care, annual eye examinations at Ophthalmology, importance of adherence with medications and regular follow-up. We also discussed long term complications of uncontrolled diabetes and hypertension.   Return in about 3 months (around 11/26/2023).  The patient was given clear instructions to go to ER or return to medical center if symptoms don't improve, worsen or  new problems develop. The patient verbalized understanding. The patient was told to call to get lab results if they haven't heard anything in the next week.   This note has been created with Education officer, environmental. Any transcriptional errors are unintentional.   Rosaline SHAUNNA Bohr, NP 08/28/2023, 10:18 AM

## 2023-08-29 LAB — LIPID PANEL
Chol/HDL Ratio: 3.3 {ratio} (ref 0.0–4.4)
Cholesterol, Total: 231 mg/dL — ABNORMAL HIGH (ref 100–199)
HDL: 70 mg/dL (ref >39)
LDL Chol Calc (NIH): 139 mg/dL — ABNORMAL HIGH (ref 0–99)
Triglycerides: 128 mg/dL (ref 0–149)
VLDL Cholesterol Cal: 22 mg/dL (ref 5–40)

## 2023-08-29 LAB — CBC WITH DIFFERENTIAL/PLATELET
Basophils Absolute: 0.1 10*3/uL (ref 0.0–0.2)
Basos: 1 %
EOS (ABSOLUTE): 0.2 10*3/uL (ref 0.0–0.4)
Eos: 2 %
Hematocrit: 43.2 % (ref 34.0–46.6)
Hemoglobin: 14 g/dL (ref 11.1–15.9)
Immature Grans (Abs): 0 10*3/uL (ref 0.0–0.1)
Immature Granulocytes: 0 %
Lymphocytes Absolute: 2.3 10*3/uL (ref 0.7–3.1)
Lymphs: 26 %
MCH: 29.1 pg (ref 26.6–33.0)
MCHC: 32.4 g/dL (ref 31.5–35.7)
MCV: 90 fL (ref 79–97)
Monocytes Absolute: 0.5 10*3/uL (ref 0.1–0.9)
Monocytes: 6 %
Neutrophils Absolute: 5.6 10*3/uL (ref 1.4–7.0)
Neutrophils: 65 %
Platelets: 363 10*3/uL (ref 150–450)
RBC: 4.81 x10E6/uL (ref 3.77–5.28)
RDW: 13 % (ref 11.7–15.4)
WBC: 8.6 10*3/uL (ref 3.4–10.8)

## 2023-08-29 LAB — CMP14+EGFR
ALT: 26 [IU]/L (ref 0–32)
AST: 15 [IU]/L (ref 0–40)
Albumin: 4.4 g/dL (ref 3.9–4.9)
Alkaline Phosphatase: 89 [IU]/L (ref 44–121)
BUN/Creatinine Ratio: 22 (ref 12–28)
BUN: 17 mg/dL (ref 8–27)
Bilirubin Total: 0.3 mg/dL (ref 0.0–1.2)
CO2: 23 mmol/L (ref 20–29)
Calcium: 9.8 mg/dL (ref 8.7–10.3)
Chloride: 98 mmol/L (ref 96–106)
Creatinine, Ser: 0.78 mg/dL (ref 0.57–1.00)
Globulin, Total: 3.2 g/dL (ref 1.5–4.5)
Glucose: 111 mg/dL — ABNORMAL HIGH (ref 70–99)
Potassium: 4.5 mmol/L (ref 3.5–5.2)
Sodium: 140 mmol/L (ref 134–144)
Total Protein: 7.6 g/dL (ref 6.0–8.5)
eGFR: 86 mL/min/{1.73_m2} (ref >59)

## 2023-08-30 ENCOUNTER — Other Ambulatory Visit: Payer: Self-pay

## 2023-08-30 ENCOUNTER — Other Ambulatory Visit (INDEPENDENT_AMBULATORY_CARE_PROVIDER_SITE_OTHER): Payer: Self-pay | Admitting: Primary Care

## 2023-08-30 MED ORDER — PRAVASTATIN SODIUM 40 MG PO TABS
80.0000 mg | ORAL_TABLET | Freq: Every day | ORAL | 1 refills | Status: DC
  Filled 2023-08-30: qty 90, 45d supply, fill #0

## 2023-09-07 ENCOUNTER — Other Ambulatory Visit: Payer: Self-pay

## 2023-09-08 ENCOUNTER — Other Ambulatory Visit: Payer: Self-pay

## 2023-09-18 ENCOUNTER — Other Ambulatory Visit: Payer: Self-pay

## 2023-09-20 NOTE — Progress Notes (Deleted)
    S:    Frances Martin ID [phone]  63 y.o. female with PMHx significant for T2DM, HLD, HTNwho presents for diabetes evaluation, education, and management.   Patient was originally referred by Primary Care Provider, Frances Martin, on 05/26/23. She was last seen by pharmacy clinic on 07/27/2023. At that time patient reported she was on Bactrim  for a UTI. She had symptoms of worsening left flank and lower back pain.   She was last seen by PCP, NP Frances Martin on 08/28/2023. At that time A1c was 6.6. She reported a home BG range of 110-140 mg/dl (unclear if this was before or after meals).  No history of ASCVD, CKD, or HF. No history of pancreatitis or thyroid cancer. Has not been hospitalized for DM.  Patient arrives in good spirits and presents without any assistance.   Family/Social History: Fhx: DM (both parents), HTN Tobacco: former smoker (stopped 1.5 years ago) Alcohol: none  Current diabetes medications: -Metformin  XR 500 mg BID or TID??? -Glipizide  5 mg XL daily (restarted December 2024)  Insurance coverage: Self-pay  Hypoglycemia?  Reported home fasting blood sugars:   Patient endorses frequent urination. Patient denies neuropathy (nerve pain). Patient denies visual changes. Patient reports self foot exams.   Patient reported dietary habits:  Breakfast:  Lunch: l Dinner: Snacks: Drinks:   Patient-reported exercise habits:    O:  Lab Results  Component Value Date   HGBA1C 6.6 08/28/2023   There were no vitals filed for this visit.  Lipid Panel     Component Value Date/Time   CHOL 231 (H) 08/28/2023 1004   TRIG 128 08/28/2023 1004   HDL 70 08/28/2023 1004   CHOLHDL 3.3 08/28/2023 1004   LDLCALC 139 (H) 08/28/2023 1004    Clinical Atherosclerotic Cardiovascular Disease (ASCVD): No  The 10-year ASCVD risk score (Arnett DK, et al., 2019) is: 10%   Values used to calculate the score:     Age: 27 years     Sex: Female     Is  Non-Hispanic African American: No     Diabetic: Yes     Tobacco smoker: No     Systolic Blood Pressure: 136 mmHg     Is BP treated: Yes     HDL Cholesterol: 70 mg/dL     Total Cholesterol: 231 mg/dL   A/P: Diabetes longstanding currently controlled based on most recent A1c of 6.6%. This is much improved from previous A1c of 8.5%.    Current metformin  dose is non-conventional, but it is appropriate given this is the highest dose she can tolerate. Patient is able to verbalize appropriate hypoglycemia management plan. Medication adherence appears appropriate.  -Continue metformin  XR 500mg  TID  -Continue glipizide  5 mg XL daily.  -Extensively discussed pathophysiology of diabetes, recommended lifestyle interventions, dietary effects on blood sugar control.  -Counseled on s/sx of and management of hypoglycemia.  -Next A1c anticipated April 2025.    Frances Martin, PharmD [facility] PGY-1

## 2023-09-21 ENCOUNTER — Ambulatory Visit: Payer: No Typology Code available for payment source | Attending: Pharmacist | Admitting: Pharmacist

## 2023-09-21 ENCOUNTER — Ambulatory Visit: Payer: No Typology Code available for payment source | Admitting: Pharmacist

## 2023-10-22 NOTE — Progress Notes (Unsigned)
 S:    InterpreterByrd Hesselbach ID #[id number]  63 y.o. female who presents for diabetes evaluation, education, and management. Patient arrives in good spirits and presents without any assistance.   Patient was originally referred by Primary Care Provider, Gwinda Passe, on 05/26/23. We saw her 06/22/2023 and stopped glipizide IR 10 mg daily. At last pharmacy visit on 07/27/23 - continued metformin XR 500 mg TID (tolerability) and started glipizide 5 mg XL given patient's aversion to needles and ongoing UTI precluding initiation of SGLT2i.  At last PCP visit on 08/28/23 A1c improved from 8.5% to 6.6%.   Today, patient reports she is doing OK. She was relieved that her last A1C was improved. She reports that she has been working hard to lose weight, but recently has not been able to go to the gym. Also reports increased anxiety recently - causing her to eat more, and not sleep well. These symptoms have been going on since the end of January. Anxiety is causing her to stay home more - reason for not going to the gym. Reports that she initially took bupropion to help with quitting smoking, also help relieved anxiety. She has remained quit from smoking and now longer feels cravings, despite being more anxious. She was prescribed fluoxetine in the past and only took for 2 days because it made her very tired. She did recently reduce to taking bupropion 1 tablet once daily vs twice daily. She did not ever start escitalopram, though she was prescribed it in the past.   PMH is significant for T2DM, HLD, HTN.  No history of ASCVD, CKD, or HF. No history of pancreatitis or thyroid cancer. Has not been hospitalized for DM.  Family/Social History: Fhx: DM (both parents), HTN Tobacco: former smoker (stopped 1.5 years ago) Alcohol: none  Current diabetes medications: Metformin XR 1000mg  (2 tabs) BID with (she is running low as the Rx was not updated and she is still getting 180 days for 90ds) Glipizide XL 5 mg  daily (reports she is taking 10 mg daily - but this is not consistent with fill history)  Insurance coverage: None   Patient  endorses hypoglycemic events if she does not eat three meals per day or if she is delayed in eating. Feels dizzy, shaky, sweaty. Reports that she treats this with glucose tablets. Reports that it does not happen often - last happened 4 days.   Reported home fasting blood sugars: -Fasting: Denies FBG > 145, 118, 127, 138, 147 -2-hr PPD/random: "a little higher" than the morning. No readings >200 mg/dL.   Patient denies frequent urination. Patient denies neuropathy (nerve pain). Patient denies visual changes. Patient reports self foot exams.   Patient reported dietary habits: Eats 3 meals/day Breakfast: eggs, spinach, cheese, half avocado, 1 slice whole bread, coffee with almond milk and stevia Lunch: lentils, veggies with proteins, fruit (pear, orange, grapefruit) Dinner: eggs, spinach, veggies, fruit, homeade bread, greek yogurt Snacks: not often, maybe popcorn or pistachios Drinks: water (6 bottles/day), [place] water (2 jugs/day), coffee (1x/day), no sugar beverages  Patient-reported exercise habits:  -Gym 2 days out of the week for 2 hours - has not been going to the gym recently due to anxiety -Uses stationary bike at home  O:  Lab Results  Component Value Date   HGBA1C 6.6 08/28/2023   There were no vitals filed for this visit.  Lipid Panel     Component Value Date/Time   CHOL 231 (H) 08/28/2023 1004   TRIG 128 08/28/2023  1004   HDL 70 08/28/2023 1004   CHOLHDL 3.3 08/28/2023 1004   LDLCALC 139 (H) 08/28/2023 1004    Clinical Atherosclerotic Cardiovascular Disease (ASCVD): No  The 10-year ASCVD risk score (Arnett DK, et al., 2019) is: 10%   Values used to calculate the score:     Age: 35 years     Sex: Female     Is Non-Hispanic African American: No     Diabetic: Yes     Tobacco smoker: No     Systolic Blood Pressure: 136 mmHg     Is BP  treated: Yes     HDL Cholesterol: 70 mg/dL     Total Cholesterol: 231 mg/dL   A/P: Diabetes longstanding currently controlled based on most recent A1c of 6.6%, much improved from 8.5%. Patient is experiencing hypoglycemia, likely related to sulfonylurea. Currently avoiding SGLT2i with recent UTI, though could consider in the future, and patient is not amenable to starting an injectable therapy like Trulicity. Therefore, will reduce dose of glipizide and reevaluate for resolution of hypoglycemia at follow-up. If this does not resolve, could pursue oral GLP-1RA Rybelsus via NovoCares PAP. Patient is able to verbalize appropriate hypoglycemia management plan. Medication adherence appears appropriate.  - Continue metformin XR 500mg  TID  - REDUCE glipizide XL to 2.5 mg daily - Extensively discussed pathophysiology of diabetes, recommended lifestyle interventions, dietary effects on blood sugar control.  - Counseled on s/sx of and management of hypoglycemia.  - Next A1c anticipated 08/2023.   Hyperlipidemia/ASCVD Risk Reduction: - Currently uncontrolled with LDL-C 139 mg/dL above goal < 70 mg/dL given DM + 10 year ASCVD risk 10%. Patient stopped taking pravastatin due to nausea. Agreeable to start moderate intensity rosuvastatin today.  - Reviewed long term complications of uncontrolled cholesterol - Recommend to start rosuvastatin 10 mg daily   Anxiety: Patient reports anxiety that is interfering with her ADLs. She currently takes burpopion which was initially prescribed for tobacco cessation. She has now been tobacco free for an extended period of time. Reports that bupropion helped anxiety at first, but now she reports feeling jittery in the morning in addition to insomnia- which can be a side effect of bupropion. Feel that patient may benefit from transition from bupropion to SSRI like sertraline for improved anxiety control. Since this would take 6-8 weeks to take effect, also would recommend  medication to manage acute anxiety in the short term like hydroxyzine. Communicated with PCP to recommend these changes and will follow-up with patient as needed.  Written patient instructions provided. Patient verbalized understanding of treatment plan.  Total time in face to face counseling 45 minutes.    Follow-up:  PCP clinic visit: 11/29/23 Pharmacist 12/11/23  Nils Pyle, PharmD PGY1 Pharmacy Resident  Butch Penny, PharmD, BCACP, CPP Clinical Pharmacist [facility] [phone]

## 2023-10-23 ENCOUNTER — Ambulatory Visit: Payer: Self-pay | Attending: Primary Care | Admitting: Pharmacist

## 2023-10-23 ENCOUNTER — Other Ambulatory Visit: Payer: Self-pay

## 2023-10-23 ENCOUNTER — Encounter: Payer: Self-pay | Admitting: Pharmacist

## 2023-10-23 DIAGNOSIS — E785 Hyperlipidemia, unspecified: Secondary | ICD-10-CM

## 2023-10-23 DIAGNOSIS — E1169 Type 2 diabetes mellitus with other specified complication: Secondary | ICD-10-CM

## 2023-10-23 DIAGNOSIS — Z7984 Long term (current) use of oral hypoglycemic drugs: Secondary | ICD-10-CM

## 2023-10-23 DIAGNOSIS — F419 Anxiety disorder, unspecified: Secondary | ICD-10-CM

## 2023-10-23 DIAGNOSIS — E119 Type 2 diabetes mellitus without complications: Secondary | ICD-10-CM

## 2023-10-23 MED ORDER — ROSUVASTATIN CALCIUM 10 MG PO TABS
10.0000 mg | ORAL_TABLET | Freq: Every day | ORAL | 3 refills | Status: DC
Start: 2023-10-23 — End: 2024-02-22
  Filled 2023-10-23: qty 90, 90d supply, fill #0

## 2023-10-23 MED ORDER — METFORMIN HCL ER 500 MG PO TB24
1000.0000 mg | ORAL_TABLET | Freq: Two times a day (BID) | ORAL | 1 refills | Status: DC
  Filled 2023-10-23: qty 180, 45d supply, fill #0
  Filled 2023-12-11: qty 180, 45d supply, fill #1

## 2023-10-23 MED ORDER — GLIPIZIDE ER 2.5 MG PO TB24
2.5000 mg | ORAL_TABLET | Freq: Every day | ORAL | 1 refills | Status: DC
  Filled 2023-10-23 (×2): qty 90, 90d supply, fill #0

## 2023-10-23 NOTE — Patient Instructions (Addendum)
 Su nivel de Production assistant, radio ideal cuando se despierta por la maana, antes de comer nada, es de entre 80 y 130 mg/dl.   Su nivel de Production assistant, radio ideal 2 horas despus de comer es inferior a 180 mg/dl  Si su nivel de azcar en sangre es inferior a 70 o se siente mareado, tembloroso o sudoroso, trtelo con 15 g de carbohidratos (como 4 comprimidos de glucosa) y controle su nivel de azcar en sangre en 15 minutos.  Contine tomando bupropin por ahora. Nos hemos puesto en contacto con [name] para analizar la posibilidad de cambiar sus medicamentos y proporcionarle algo para la ansiedad aguda [name]. Nos pondremos en contacto con usted cuando se hayan realizado los pedidos.   Cambios en la medicacin - REDUCIR la glipizida a 2,5 mg XL por da. Hemos enviado esta nueva dosis a la [name]. - Continuar con metformina dos comprimidos dos veces al da. - Comience con rosuvastatina 10 mg una vez al da  ___________________________________________________________________________________________   Your goal blood sugar when you wake up in the morning, before eating anything, is between 80 and 130 mg/dL.   Your goal blood sugar 2 hours after eating is less than 180 mg/dL  If your blood sugar is less than 70 or you feel dizzy, shaky, sweaty - treat with 15 g of carbohydrates (like 4 glucose tablets), and check your blood sugar in 15 minutes  Continue buproprion for now - we have reached out to [name] to look into switching your medications and providing something for acute anxiety in the meantime. We will reach out to you when the orders have been placed.   Medication changes - REDUCE Glipizide to 2.5 mg XL daily - we have sent this new dosage to the pharmacy

## 2023-10-24 ENCOUNTER — Other Ambulatory Visit: Payer: Self-pay

## 2023-10-25 ENCOUNTER — Other Ambulatory Visit (INDEPENDENT_AMBULATORY_CARE_PROVIDER_SITE_OTHER): Payer: Self-pay

## 2023-10-26 ENCOUNTER — Other Ambulatory Visit (INDEPENDENT_AMBULATORY_CARE_PROVIDER_SITE_OTHER): Payer: Self-pay

## 2023-10-26 ENCOUNTER — Other Ambulatory Visit: Payer: Self-pay

## 2023-10-26 MED ORDER — SERTRALINE HCL 25 MG PO TABS
ORAL_TABLET | ORAL | 0 refills | Status: DC
  Filled 2023-10-26: qty 90, 97d supply, fill #0

## 2023-10-26 MED ORDER — HYDROXYZINE HCL 10 MG PO TABS
10.0000 mg | ORAL_TABLET | Freq: Three times a day (TID) | ORAL | 0 refills | Status: DC | PRN
  Filled 2023-10-26: qty 90, 30d supply, fill #0

## 2023-11-03 ENCOUNTER — Encounter (INDEPENDENT_AMBULATORY_CARE_PROVIDER_SITE_OTHER): Payer: Self-pay | Admitting: Primary Care

## 2023-11-03 DIAGNOSIS — K219 Gastro-esophageal reflux disease without esophagitis: Secondary | ICD-10-CM

## 2023-11-03 NOTE — Telephone Encounter (Signed)
 Will forward to provider

## 2023-11-29 ENCOUNTER — Encounter (INDEPENDENT_AMBULATORY_CARE_PROVIDER_SITE_OTHER): Payer: Self-pay | Admitting: Primary Care

## 2023-11-29 ENCOUNTER — Ambulatory Visit (INDEPENDENT_AMBULATORY_CARE_PROVIDER_SITE_OTHER): Payer: Self-pay | Admitting: Primary Care

## 2023-11-29 VITALS — BP 130/76 | HR 82 | Resp 16 | Wt 199.8 lb

## 2023-11-29 DIAGNOSIS — F32A Depression, unspecified: Secondary | ICD-10-CM

## 2023-11-29 DIAGNOSIS — E785 Hyperlipidemia, unspecified: Secondary | ICD-10-CM

## 2023-11-29 DIAGNOSIS — Z7984 Long term (current) use of oral hypoglycemic drugs: Secondary | ICD-10-CM

## 2023-11-29 DIAGNOSIS — I10 Essential (primary) hypertension: Secondary | ICD-10-CM

## 2023-11-29 DIAGNOSIS — E1169 Type 2 diabetes mellitus with other specified complication: Secondary | ICD-10-CM

## 2023-11-29 DIAGNOSIS — E119 Type 2 diabetes mellitus without complications: Secondary | ICD-10-CM

## 2023-11-29 NOTE — Progress Notes (Signed)
 [facility]  Frances Martin, is a 63 y.o. female  ZOX:[id number]  WJX:[id number]  DOB - August 05, 1961  Chief Complaint  Patient presents with   Hypertension   Diabetes   Hyperlipidemia       Subjective:   Frances Martin is a 63 y.o. Hispanic obese female Frances Martin [phone]) here today for a follow up visit HTN-. Patient has No headache, No chest pain, No abdominal pain - No Nausea, No new weakness tingling or numbness, No Cough - shortness of breath T2D- Denies polyuria, polydipsia, polyphasia or vision changes.  Does not check blood sugars at home.  Depression denies harm to self or others no audible or visual hallucination.  No problems updated.  Comprehensive ROS Pertinent positive and negative noted in HPI   Allergies  Allergen Reactions   Pravastatin Sodium Nausea Only    Past Medical History:  Diagnosis Date   Hemorrhoids    Hyperlipidemia    Hypertension    Pre-diabetes     Current Outpatient Medications on File Prior to Visit  Medication Sig Dispense Refill   celecoxib (CELEBREX) 200 MG capsule Take 1 capsule (200 mg total) by mouth 2 (two) times daily. 180 capsule 1   glipiZIDE (GLUCOTROL XL) 2.5 MG 24 hr tablet Take 1 tablet (2.5 mg total) by mouth daily with breakfast. 90 tablet 1   hydrochlorothiazide (HYDRODIURIL) 25 MG tablet Take 1 tablet (25 mg total) by mouth daily. 90 tablet 1   hydrOXYzine (ATARAX) 10 MG tablet Take 1 tablet (10 mg total) by mouth 3 (three) times daily as needed. 90 tablet 0   losartan (COZAAR) 50 MG tablet Take 1 tablet (50 mg total) by mouth daily. 90 tablet 1   metFORMIN (GLUCOPHAGE-XR) 500 MG 24 hr tablet Take 2 tablets (1,000 mg total) by mouth 2 (two) times daily with a meal. 180 tablet 1   pantoprazole (PROTONIX) 40 MG tablet Take 1 tablet (40 mg total) by mouth daily. 90 tablet 2   rosuvastatin (CRESTOR) 10 MG tablet Take 1 tablet (10 mg total) by mouth daily. 90 tablet 3   sertraline (ZOLOFT) 25 MG  tablet Take 0.5 tablets (12.5 mg total) by mouth daily for 14 days, THEN 1 tablet (25 mg total) daily. 90 tablet 0   No current facility-administered medications on file prior to visit.   Health Maintenance  Topic Date Due   Eye exam for diabetics  Never done   COVID-19 Vaccine (1 - 2024-25 season) Never done   Yearly kidney health urinalysis for diabetes  11/16/2023   Hemoglobin A1C  02/25/2024   Flu Shot  03/15/2024   Yearly kidney function blood test for diabetes  08/27/2024   Complete foot exam   08/27/2024   Mammogram  02/08/2025   DTaP/Tdap/Td vaccine (2 - Td or Tdap) 04/14/2027   Colon Cancer Screening  01/23/2028   Pap with HPV screening  05/04/2028   Pneumococcal Vaccination  Completed   Hepatitis C Screening  Completed   HIV Screening  Completed   Zoster (Shingles) Vaccine  Completed   HPV Vaccine  Aged Out   Meningitis B Vaccine  Aged Out    Objective:   Vitals:   11/29/23 0850  BP: 130/76  Pulse: 82  Resp: 16  SpO2: 95%  Weight: 199 lb 12.8 oz (90.6 kg)   BP Readings from Last 3 Encounters:  11/29/23 130/76  08/28/23 136/69  07/21/23 125/77      Physical Exam    Assessment & Plan  Frances "  Taneal Sonntag" was seen today for hypertension, diabetes and hyperlipidemia.  Diagnoses and all orders for this visit:  Essential hypertension BP goal - < 140/90 Well contolled DIET: Limit salt intake, read nutrition labels to check salt content, limit fried and high fatty foods  Avoid using multisymptom OTC cold preparations that generally contain sudafed which can rise BP. Consult with pharmacist on best cold relief products to use for persons with HTN EXERCISE Discussed incorporating exercise such as walking - 30 minutes most days of the week and can do in 10 minute intervals    -     CBC with Differential/Platelet -     CMP14+EGFR  Type 2 diabetes mellitus without complication, without long-term current use of insulin (HCC) - educated on lifestyle  modifications, including but not limited to diet choices and adding exercise to daily routine.   -     CBC with Differential/Platelet -     Lipid panel -     Hemoglobin A1c -     Microalbumin / creatinine urine ratio  Hyperlipidemia associated with type 2 diabetes mellitus (HCC) -     Lipid panel  Depression, unspecified depression type SeeHPI   Patient have been counseled extensively about nutrition and exercise. Other issues discussed during this visit include: low cholesterol diet, weight control and daily exercise, foot care, annual eye examinations at Ophthalmology, importance of adherence with medications and regular follow-up. We also discussed long term complications of uncontrolled diabetes and hypertension.   Return in about 6 months (around 05/30/2024).  The patient was given clear instructions to go to ER or return to medical center if symptoms don't improve, worsen or new problems develop. The patient verbalized understanding. The patient was told to call to get lab results if they haven't heard anything in the next week.   This note has been created with Education officer, environmental. Any transcriptional errors are unintentional.   Marius Siemens, NP 11/29/2023, 9:09 AM

## 2023-11-30 ENCOUNTER — Other Ambulatory Visit: Payer: Self-pay

## 2023-11-30 ENCOUNTER — Encounter (INDEPENDENT_AMBULATORY_CARE_PROVIDER_SITE_OTHER): Payer: Self-pay | Admitting: Primary Care

## 2023-11-30 ENCOUNTER — Other Ambulatory Visit (INDEPENDENT_AMBULATORY_CARE_PROVIDER_SITE_OTHER): Payer: Self-pay

## 2023-11-30 MED ORDER — GLIPIZIDE ER 5 MG PO TB24
5.0000 mg | ORAL_TABLET | Freq: Every day | ORAL | 1 refills | Status: DC
  Filled 2023-11-30: qty 90, 90d supply, fill #0

## 2023-12-01 ENCOUNTER — Other Ambulatory Visit (INDEPENDENT_AMBULATORY_CARE_PROVIDER_SITE_OTHER): Payer: Self-pay

## 2023-12-01 ENCOUNTER — Other Ambulatory Visit: Payer: Self-pay

## 2023-12-01 ENCOUNTER — Other Ambulatory Visit (INDEPENDENT_AMBULATORY_CARE_PROVIDER_SITE_OTHER): Payer: Self-pay | Admitting: Primary Care

## 2023-12-01 DIAGNOSIS — Z760 Encounter for issue of repeat prescription: Secondary | ICD-10-CM

## 2023-12-01 DIAGNOSIS — I10 Essential (primary) hypertension: Secondary | ICD-10-CM

## 2023-12-01 MED ORDER — LOSARTAN POTASSIUM 50 MG PO TABS
50.0000 mg | ORAL_TABLET | Freq: Every day | ORAL | 1 refills | Status: DC
Start: 2023-12-01 — End: 2024-05-30
  Filled 2023-12-01: qty 90, 90d supply, fill #0
  Filled 2024-03-18: qty 90, 90d supply, fill #1

## 2023-12-03 LAB — MICROALBUMIN / CREATININE URINE RATIO
Creatinine, Urine: 68.7 mg/dL
Microalb/Creat Ratio: 70 mg/g{creat} — ABNORMAL HIGH (ref 0–29)
Microalbumin, Urine: 48.3 ug/mL

## 2023-12-03 LAB — LIPID PANEL
Chol/HDL Ratio: 2.6 ratio (ref 0.0–4.4)
Cholesterol, Total: 154 mg/dL (ref 100–199)
HDL: 60 mg/dL (ref >39)
LDL Chol Calc (NIH): 76 mg/dL (ref 0–99)
Triglycerides: 101 mg/dL (ref 0–149)
VLDL Cholesterol Cal: 18 mg/dL (ref 5–40)

## 2023-12-03 LAB — CMP14+EGFR
ALT: 35 IU/L — ABNORMAL HIGH (ref 0–32)
AST: 22 IU/L (ref 0–40)
Albumin: 4.3 g/dL (ref 3.9–4.9)
Alkaline Phosphatase: 86 IU/L (ref 44–121)
BUN/Creatinine Ratio: 20 (ref 12–28)
BUN: 14 mg/dL (ref 8–27)
Bilirubin Total: 0.3 mg/dL (ref 0.0–1.2)
CO2: 25 mmol/L (ref 20–29)
Calcium: 9.3 mg/dL (ref 8.7–10.3)
Chloride: 101 mmol/L (ref 96–106)
Creatinine, Ser: 0.71 mg/dL (ref 0.57–1.00)
Globulin, Total: 3.1 g/dL (ref 1.5–4.5)
Glucose: 129 mg/dL — ABNORMAL HIGH (ref 70–99)
Potassium: 4.7 mmol/L (ref 3.5–5.2)
Sodium: 141 mmol/L (ref 134–144)
Total Protein: 7.4 g/dL (ref 6.0–8.5)
eGFR: 97 mL/min/{1.73_m2} (ref >59)

## 2023-12-03 LAB — CBC WITH DIFFERENTIAL/PLATELET
Basophils Absolute: 0.1 10*3/uL (ref 0.0–0.2)
Basos: 1 %
EOS (ABSOLUTE): 0.1 10*3/uL (ref 0.0–0.4)
Eos: 1 %
Hematocrit: 41.4 % (ref 34.0–46.6)
Hemoglobin: 13.5 g/dL (ref 11.1–15.9)
Immature Grans (Abs): 0 10*3/uL (ref 0.0–0.1)
Immature Granulocytes: 0 %
Lymphocytes Absolute: 3.1 10*3/uL (ref 0.7–3.1)
Lymphs: 37 %
MCH: 29 pg (ref 26.6–33.0)
MCHC: 32.6 g/dL (ref 31.5–35.7)
MCV: 89 fL (ref 79–97)
Monocytes Absolute: 0.4 10*3/uL (ref 0.1–0.9)
Monocytes: 5 %
Neutrophils Absolute: 4.6 10*3/uL (ref 1.4–7.0)
Neutrophils: 56 %
Platelets: 343 10*3/uL (ref 150–450)
RBC: 4.66 x10E6/uL (ref 3.77–5.28)
RDW: 13.5 % (ref 11.7–15.4)
WBC: 8.3 10*3/uL (ref 3.4–10.8)

## 2023-12-03 LAB — HEMOGLOBIN A1C
Est. average glucose Bld gHb Est-mCnc: 154 mg/dL
Hgb A1c MFr Bld: 7 % — ABNORMAL HIGH (ref 4.8–5.6)

## 2023-12-10 NOTE — Progress Notes (Unsigned)
 S:    InterpreterShelagh Derrick ID #[id number]  63 y.o. female who presents for diabetes evaluation, education, and management. Patient arrives in good spirits and presents without any assistance.   Patient was originally referred by Primary Care Provider, Madelyn Schick, on 05/26/23. We saw her 06/22/2023 and stopped glipizide  IR 10 mg daily. At last pharmacy visit on 07/27/23 - continued metformin  XR 500 mg TID (tolerability) and started glipizide  5 mg XL given patient's aversion to needles and ongoing UTI precluding initiation of SGLT2i.  At PCP visit on 08/28/23 A1c improved from 8.5% to 6.6%. At pharmacy follow-up on 10/23/23, patient reported symptoms of hypoglycemia so we reduced her glipizide  from 5 mg daily to 2.5 mg daily. Patient also expressed concern for anxiety and insomnia that was affecting her ADLs - we worked with her PCP to stop bupropion  and start sertraline  and hydroxyzine . At PCP follow-up on 11/30/23, A1C had increased to 7% and she was instructed to restart glipizide  XL at 5 mg daily.   Today, patient reports she is doing OK.   ***f/u anxiety with sertraline  and hydroxyzine  - stopped bupropion ? ***hypo? Try for Rybelsus PAP?  OLD: She was relieved that her last A1C was improved. She reports that she has been working hard to lose weight, but recently has not been able to go to the gym. Also reports increased anxiety recently - causing her to eat more, and not sleep well. These symptoms have been going on since the end of January. Anxiety is causing her to stay home more - reason for not going to the gym. Reports that she initially took bupropion  to help with quitting smoking, also help relieved anxiety. She has remained quit from smoking and now longer feels cravings, despite being more anxious. She was prescribed fluoxetine  in the past and only took for 2 days because it made her very tired. She did recently reduce to taking bupropion  1 tablet once daily vs twice daily. She did not ever  start escitalopram , though she was prescribed it in the past.   PMH is significant for T2DM, HLD, HTN.  No history of ASCVD, CKD, or HF. No history of pancreatitis or thyroid cancer. Has not been hospitalized for DM.  Family/Social History: Fhx: DM (both parents), HTN Tobacco: former smoker (stopped 1.5 years ago) Alcohol: none  Current diabetes medications: Metformin  XR 1000mg  (2 tabs) BID with (she is running low as the Rx was not updated and she is still getting 180 days for 90ds) Glipizide  XL 5 mg daily (reports she is taking 10 mg daily - but this is not consistent with fill history)  Insurance coverage: None   Patient  endorses hypoglycemic events if she does not eat three meals per day or if she is delayed in eating. Feels dizzy, shaky, sweaty. Reports that she treats this with glucose tablets. Reports that it does not happen often - last happened 4 days.   Reported home fasting blood sugars:*** -Fasting: Denies FBG > 145, 118, 127, 138, 147 -2-hr PPD/random: "a little higher" than the morning. No readings >200 mg/dL.   Patient denies frequent urination. Patient denies neuropathy (nerve pain). Patient denies visual changes. Patient reports self foot exams.   Patient reported dietary habits: Eats 3 meals/day Breakfast: eggs, spinach, cheese, half avocado, 1 slice whole bread, coffee with almond milk and stevia Lunch: lentils, veggies with proteins, fruit (pear, orange, grapefruit) Dinner: eggs, spinach, veggies, fruit, homeade bread, greek yogurt Snacks: not often, maybe popcorn or pistachios Drinks: water (  6 bottles/day), [place] water (2 jugs/day), coffee (1x/day), no sugar beverages  Patient-reported exercise habits: *** -Gym 2 days out of the week for 2 hours - has not been going to the gym recently due to anxiety -Uses stationary bike at home  O:  Lab Results  Component Value Date   HGBA1C 7.0 (H) 11/29/2023   There were no vitals filed for this visit.  BP  Readings from Last 3 Encounters:  11/29/23 130/76  08/28/23 136/69  07/21/23 125/77    Lipid Panel     Component Value Date/Time   CHOL 154 11/29/2023 0851   TRIG 101 11/29/2023 0851   HDL 60 11/29/2023 0851   CHOLHDL 2.6 11/29/2023 0851   LDLCALC 76 11/29/2023 0851    Clinical Atherosclerotic Cardiovascular Disease (ASCVD): No  The 10-year ASCVD risk score (Arnett DK, et al., 2019) is: 8.5%   Values used to calculate the score:     Age: 62 years     Sex: Female     Is Non-Hispanic African American: No     Diabetic: Yes     Tobacco smoker: No     Systolic Blood Pressure: 130 mmHg     Is BP treated: Yes     HDL Cholesterol: 60 mg/dL     Total Cholesterol: 154 mg/dL   A/P: Diabetes longstanding currently controlled based on most recent A1c of 6.6%, much improved from 8.5%. Patient is experiencing hypoglycemia, likely related to sulfonylurea. Currently avoiding SGLT2i with recent UTI, though could consider in the future, and patient is not amenable to starting an injectable therapy like Trulicity. Therefore, will reduce dose of glipizide  and reevaluate for resolution of hypoglycemia at follow-up. If this does not resolve, could pursue oral GLP-1RA Rybelsus via NovoCares PAP. Patient is able to verbalize appropriate hypoglycemia management plan. Medication adherence appears appropriate.  - Continue metformin  XR 500mg  TID  - REDUCE glipizide  XL to 2.5 mg daily - Extensively discussed pathophysiology of diabetes, recommended lifestyle interventions, dietary effects on blood sugar control.  - Counseled on s/sx of and management of hypoglycemia.  - Next A1c anticipated 08/2023.   Hyperlipidemia/ASCVD Risk Reduction: - Currently uncontrolled with LDL-C 139 mg/dL above goal < 70 mg/dL given DM + 10 year ASCVD risk 10%. Patient stopped taking pravastatin  due to nausea. Agreeable to start moderate intensity rosuvastatin  today.  - Reviewed long term complications of uncontrolled  cholesterol - Recommend to start rosuvastatin  10 mg daily   Anxiety: Patient reports anxiety that is interfering with her ADLs. She currently takes burpopion which was initially prescribed for tobacco cessation. She has now been tobacco free for an extended period of time. Reports that bupropion  helped anxiety at first, but now she reports feeling jittery in the morning in addition to insomnia- which can be a side effect of bupropion . Feel that patient may benefit from transition from bupropion  to SSRI like sertraline  for improved anxiety control. Since this would take 6-8 weeks to take effect, also would recommend medication to manage acute anxiety in the short term like hydroxyzine . Communicated with PCP to recommend these changes and will follow-up with patient as needed.  Written patient instructions provided. Patient verbalized understanding of treatment plan.  Total time in face to face counseling 45 minutes.    Follow-up:  Pharmacist 12/11/23 PCP clinic visit: 05/30/24  Arthea Larsson, PharmD PGY1 Pharmacy Resident  Marene Shape, PharmD, BCACP, CPP Clinical Pharmacist [facility] [phone]

## 2023-12-11 ENCOUNTER — Ambulatory Visit: Payer: Self-pay | Attending: Nurse Practitioner | Admitting: Pharmacist

## 2023-12-11 ENCOUNTER — Other Ambulatory Visit: Payer: Self-pay

## 2023-12-11 ENCOUNTER — Encounter: Payer: Self-pay | Admitting: Pharmacist

## 2023-12-11 DIAGNOSIS — E119 Type 2 diabetes mellitus without complications: Secondary | ICD-10-CM

## 2023-12-11 DIAGNOSIS — Z7984 Long term (current) use of oral hypoglycemic drugs: Secondary | ICD-10-CM

## 2023-12-11 MED ORDER — DAPAGLIFLOZIN PROPANEDIOL 10 MG PO TABS
10.0000 mg | ORAL_TABLET | Freq: Every day | ORAL | 3 refills | Status: DC
  Filled 2023-12-11: qty 30, 30d supply, fill #0
  Filled 2024-01-10: qty 30, 30d supply, fill #1
  Filled 2024-01-11: qty 14, 14d supply, fill #1
  Filled 2024-01-20 – 2024-03-18 (×2): qty 14, 14d supply, fill #2

## 2023-12-12 ENCOUNTER — Other Ambulatory Visit: Payer: Self-pay

## 2023-12-12 ENCOUNTER — Other Ambulatory Visit (INDEPENDENT_AMBULATORY_CARE_PROVIDER_SITE_OTHER): Payer: Self-pay

## 2023-12-12 DIAGNOSIS — E119 Type 2 diabetes mellitus without complications: Secondary | ICD-10-CM

## 2023-12-12 MED ORDER — METFORMIN HCL ER 500 MG PO TB24
1000.0000 mg | ORAL_TABLET | Freq: Two times a day (BID) | ORAL | 1 refills | Status: DC
  Filled 2023-12-12: qty 180, 45d supply, fill #0

## 2023-12-13 ENCOUNTER — Other Ambulatory Visit: Payer: Self-pay

## 2023-12-18 ENCOUNTER — Other Ambulatory Visit: Payer: Self-pay

## 2023-12-21 ENCOUNTER — Other Ambulatory Visit: Payer: Self-pay

## 2023-12-22 ENCOUNTER — Other Ambulatory Visit: Payer: Self-pay

## 2023-12-26 ENCOUNTER — Other Ambulatory Visit: Payer: Self-pay

## 2024-01-02 ENCOUNTER — Other Ambulatory Visit: Payer: Self-pay | Admitting: Obstetrics and Gynecology

## 2024-01-02 DIAGNOSIS — Z1231 Encounter for screening mammogram for malignant neoplasm of breast: Secondary | ICD-10-CM

## 2024-01-10 ENCOUNTER — Other Ambulatory Visit: Payer: Self-pay

## 2024-01-10 NOTE — Progress Notes (Unsigned)
 S:    InterpreterShelagh Derrick ID #[id number]  63 y.o. female who presents for diabetes evaluation, education, and management. Patient arrives in good spirits and presents without any assistance.   Patient was last seen and referred by Primary Care Provider, Madelyn Schick, on 11/30/2023. Last visit with pharmacist 12/11/2023 patient reported taking metformin  500 mg XR in the morning and 1500 mg XR in the evening that has helped with maintaining sugar levels and mitigating GI effects. She denied hypoglycemia with adjusted metformin  schedule and taking her glipizide  before lunch. Patient started rosuvastatin  10 mg daily and recent LDL (11/29/2023) was 76. Patient was started on Farxiga  10 mg daily due to UACR of 70.   PMH is significant for T2DM, HLD, HTN.  No history of ASCVD, CKD, or HF.  Today,   ***confirm starting Farxiga  ***UTIs since starting ***hypoglycemia  Reported home fasting blood sugars: ***  ***nocturia  ***neuropathy (nerve pain). ***visual changes. Patient reports self foot exams.   Family/Social History: Fhx: DM (both parents), HTN Tobacco: former smoker (stopped 1.5 years ago) Alcohol: none  Current diabetes medications: Metformin  XR 500mg  in the morning with 1500 (3 tabs) in the evening Glipizide  XL 5 mg daily Farxiga  10 mg daily   Current antihyperlipidemic medications:  Rosuvastatin  10 mg daily   Insurance coverage: None   Patient reported dietary habits: Eats 3 meals/day Breakfast: eggs, spinach, cheese, half avocado, 1 slice whole bread, coffee with almond milk and stevia Lunch: lentils, veggies with proteins, fruit (pear, orange, grapefruit) Dinner: eggs, spinach, veggies, fruit, homeade bread, greek yogurt Snacks: not often, maybe popcorn or pistachios Drinks: water (6 bottles/day), [place] water (2 jugs/day), coffee (1x/day), no sugar beverages  Patient-reported exercise habits:  -Gym 2 days out of the week for 2 hours - has not been going to the gym  recently due to anxiety -Uses stationary bike at home  O:  Lab Results  Component Value Date   HGBA1C 7.0 (H) 11/29/2023   There were no vitals filed for this visit.  BP Readings from Last 3 Encounters:  11/29/23 130/76  08/28/23 136/69  07/21/23 125/77   Lipid Panel     Component Value Date/Time   CHOL 154 11/29/2023 0851   TRIG 101 11/29/2023 0851   HDL 60 11/29/2023 0851   CHOLHDL 2.6 11/29/2023 0851   LDLCALC 76 11/29/2023 0851   Clinical Atherosclerotic Cardiovascular Disease (ASCVD): No  The 10-year ASCVD risk score (Arnett DK, et al., 2019) is: 8.5%   Values used to calculate the score:     Age: 74 years     Sex: Female     Is Non-Hispanic African American: No     Diabetic: Yes     Tobacco smoker: No     Systolic Blood Pressure: 130 mmHg     Is BP treated: Yes     HDL Cholesterol: 60 mg/dL     Total Cholesterol: 154 mg/dL   A/P: Diabetes longstanding currently controlled based on most recent A1c of 7%. Adherence appears ***. Patient is *** with current diabetic regimen, and started Farxiga  at last visit. *** Patient is able to verbalize appropriate hypoglycemia management plan. Medication adherence appears appropriate.  - Continue metformin  XR 500mg  TID  - discontinue glipizide  XL 5 mg daily. - Continue Farxgia 10 mg daily - Extensively discussed pathophysiology of diabetes, recommended lifestyle interventions, dietary effects on blood sugar control.  - Counseled on s/sx of and management of hypoglycemia.  - Next A1c anticipated 02/2024.   Hyperlipidemia/ASCVD  Risk Reduction: - Currently close to goal with LDL-C 76 mg/dL (goal < 70 mg/dL). Primary prevention in the setting of DM with no hx of clinical ASCVD. - Continue rosuvastatin  10 mg daily - Reviewed long term complications of uncontrolled cholesterol  Written patient instructions provided. Patient verbalized understanding of treatment plan.  Total time in face to face counseling 30 minutes.     Follow-up:  Pharmacist 01/11/24 PCP clinic visit: 05/30/24  Georges Kings M.S. PharmD Candidate Class of 2026 [employer]   ***

## 2024-01-11 ENCOUNTER — Encounter: Payer: Self-pay | Admitting: Pharmacist

## 2024-01-11 ENCOUNTER — Other Ambulatory Visit: Payer: Self-pay

## 2024-01-11 ENCOUNTER — Ambulatory Visit: Payer: Self-pay | Attending: Physician Assistant | Admitting: Pharmacist

## 2024-01-11 DIAGNOSIS — Z7984 Long term (current) use of oral hypoglycemic drugs: Secondary | ICD-10-CM

## 2024-01-11 DIAGNOSIS — E119 Type 2 diabetes mellitus without complications: Secondary | ICD-10-CM

## 2024-01-11 MED ORDER — METFORMIN HCL ER 500 MG PO TB24
2000.0000 mg | ORAL_TABLET | Freq: Every day | ORAL | 1 refills | Status: DC
Start: 2024-01-11 — End: 2024-02-22
  Filled 2024-01-11 – 2024-01-20 (×2): qty 360, 90d supply, fill #0

## 2024-01-11 MED ORDER — SERTRALINE HCL 25 MG PO TABS
25.0000 mg | ORAL_TABLET | Freq: Every day | ORAL | 1 refills | Status: DC
  Filled 2024-01-11 – 2024-01-20 (×3): qty 90, 90d supply, fill #0
  Filled 2024-04-17: qty 90, 90d supply, fill #1

## 2024-01-12 ENCOUNTER — Telehealth: Payer: Self-pay

## 2024-01-12 NOTE — Telephone Encounter (Signed)
 Submitted application for FARXIGA to [employer] for patient assistance.   Phone: [phone]

## 2024-01-15 ENCOUNTER — Other Ambulatory Visit: Payer: Self-pay

## 2024-01-15 ENCOUNTER — Telehealth: Payer: Self-pay

## 2024-01-15 NOTE — Telephone Encounter (Signed)
 Received notification from [employer] regarding approval for FARXIGA . Patient assistance approved from 01/15/2024 to 01/14/2025.  Medication will ship to [address], [place], [place] [address]  Pt ID: [id number]  Company phone: [phone]

## 2024-01-22 ENCOUNTER — Other Ambulatory Visit: Payer: Self-pay

## 2024-02-22 ENCOUNTER — Encounter: Payer: Self-pay | Admitting: Pharmacist

## 2024-02-22 ENCOUNTER — Other Ambulatory Visit: Payer: Self-pay

## 2024-02-22 ENCOUNTER — Ambulatory Visit: Payer: Self-pay | Attending: Primary Care | Admitting: Pharmacist

## 2024-02-22 DIAGNOSIS — Z7985 Long-term (current) use of injectable non-insulin antidiabetic drugs: Secondary | ICD-10-CM

## 2024-02-22 DIAGNOSIS — E785 Hyperlipidemia, unspecified: Secondary | ICD-10-CM

## 2024-02-22 DIAGNOSIS — Z7984 Long term (current) use of oral hypoglycemic drugs: Secondary | ICD-10-CM

## 2024-02-22 DIAGNOSIS — E1169 Type 2 diabetes mellitus with other specified complication: Secondary | ICD-10-CM

## 2024-02-22 DIAGNOSIS — E119 Type 2 diabetes mellitus without complications: Secondary | ICD-10-CM

## 2024-02-22 LAB — POCT GLYCOSYLATED HEMOGLOBIN (HGB A1C): HbA1c, POC (controlled diabetic range): 6.6 % (ref 0.0–7.0)

## 2024-02-22 MED ORDER — FLUCONAZOLE 150 MG PO TABS
150.0000 mg | ORAL_TABLET | Freq: Every day | ORAL | 0 refills | Status: AC
  Filled 2024-02-22: qty 1, 1d supply, fill #0

## 2024-02-22 MED ORDER — ROSUVASTATIN CALCIUM 10 MG PO TABS
10.0000 mg | ORAL_TABLET | Freq: Every day | ORAL | 3 refills | Status: DC
  Filled 2024-02-22: qty 90, 90d supply, fill #0
  Filled 2024-05-30: qty 90, 90d supply, fill #1
  Filled 2024-08-27: qty 90, 90d supply, fill #2

## 2024-02-22 MED ORDER — METFORMIN HCL ER 500 MG PO TB24
1500.0000 mg | ORAL_TABLET | Freq: Every day | ORAL | 1 refills | Status: DC
  Filled 2024-02-22 – 2024-04-17 (×4): qty 270, 90d supply, fill #0

## 2024-02-22 MED ORDER — TRULICITY 0.75 MG/0.5ML ~~LOC~~ SOAJ
0.7500 mg | SUBCUTANEOUS | 1 refills | Status: DC
  Filled 2024-02-22: qty 2, 28d supply, fill #0
  Filled 2024-03-18: qty 2, 28d supply, fill #1

## 2024-02-22 NOTE — Progress Notes (Signed)
 S:    InterpreterBETHA Pila, ID# [phone]  63 y.o. female who presents for diabetes evaluation, education, and management. Patient arrives in good spirits and presents without any assistance.   Patient was last seen and referred by Primary Care Provider, Rosaline Bohr, on 11/29/2023. Last visit with pharmacist 01/11/2024. At that time, patient reported adherence to her DM regimen as prescribed.   PMH is significant for T2DM, HLD, HTN.  No history of ASCVD, CKD, or HF.  Today, patient reports doing well. Endroses adherence to her medication regimen. She reports that she is taking metformin  4 tablets at night due to her fasting glucose levels being high in the morning on her previous regimen. She reports that she has seen improvements since taking her metformin  this way. She is also taking Farxiga  and glipizide  as prescribed. 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 Willing to try injections for weight loss. No hx of pancreatitis or thyroid cancer.   Patient denies polyuria, polydipsia. Patient denies neuropathy (nerve pain). Patient denies visual changes. Patient reports self foot exams.   Family/Social History: Fhx: DM (both parents), HTN Tobacco: former smoker (stopped 1.5 years ago) Alcohol: none  Current diabetes medications: Metformin  XR 2000 mg (four 500 mg tablets in the evening)  Glipizide  XL 5 mg daily Farxiga  10 mg daily   Current antihyperlipidemic medications:  Rosuvastatin  10 mg daily   Insurance coverage: None   Patient reported dietary habits: reports no changes since last visit  Eats 3 meals/day Breakfast: eggs, spinach, cheese, half avocado, 1 slice whole bread, coffee with almond milk and stevia Lunch: lentils, veggies with proteins, fruit (pear, orange, grapefruit) Dinner: eggs, spinach, veggies, fruit, homeade bread, greek yogurt Snacks: not often, maybe popcorn or pistachios Drinks: water (6 bottles/day), [place] water (2  jugs/day), coffee (1x/day), no sugar beverages  Patient-reported exercise habits:  -Gym 2 days out of the week for 2 hours - has not been going to the gym recently due to anxiety -Uses stationary bike at home  O:  Lab Results  Component Value Date   HGBA1C 6.6 02/22/2024   There were no vitals filed for this visit.  BP Readings from Last 3 Encounters:  11/29/23 130/76  08/28/23 136/69  07/21/23 125/77   Lipid Panel     Component Value Date/Time   CHOL 154 11/29/2023 0851   TRIG 101 11/29/2023 0851   HDL 60 11/29/2023 0851   CHOLHDL 2.6 11/29/2023 0851   LDLCALC 76 11/29/2023 0851   Clinical Atherosclerotic Cardiovascular Disease (ASCVD): No  The 10-year ASCVD risk score (Arnett DK, et al., 2019) is: 8.5%   Values used to calculate the score:     Age: 38 years     Clincally relevant sex: Female     Is Non-Hispanic African American: No     Diabetic: Yes     Tobacco smoker: No     Systolic Blood Pressure: 130 mmHg     Is BP treated: Yes     HDL Cholesterol: 60 mg/dL     Total Cholesterol: 154 mg/dL   A/P: Diabetes longstanding currently controlled based on today's A1c of 6.6%! Commended her for this. Adherence appears to be optimal, however, she is interested in a medication that could help her lose weight. Additionally, she has trouble taking 4 tablets of metformin  daily and requests to decrease to 3 (1500mg ) daily. She is currently asymptomatic. Patient is able to verbalize appropriate hypoglycemia management plan.  - Decrease metformin  XR  to 1500 mg daily (three 500 mg XR tablets at night) per request.  - Discontinue glipizide  XL 5 mg daily. - Continue Farxgia 10 mg daily. - Start Trulicity  0.75 mg weekly. Patient was educated on the use of the Trulicity  pen. Reviewed necessary supplies and operation of the pen.Ozempic would offer better weight loss but she can get Trulicity  today for free. Ozempic PASS approval could take 4-6 weeks. - Extensively discussed  pathophysiology of diabetes, recommended lifestyle interventions, dietary effects on blood sugar control.  - Counseled on s/sx of and management of hypoglycemia.  - Next A1c anticipated 05/2024.   Written patient instructions provided. Patient verbalized understanding of treatment plan.  Total time in face to face counseling 30 minutes.    Follow-up:  Pharmacist: 02/22/2024  PCP clinic visit: 05/30/24  Herlene Fleeta Morris, PharmD, BCACP, CPP Clinical Pharmacist [facility] [phone]

## 2024-03-19 ENCOUNTER — Other Ambulatory Visit: Payer: Self-pay

## 2024-03-22 ENCOUNTER — Telehealth: Payer: Self-pay | Admitting: Pharmacist

## 2024-03-22 NOTE — Telephone Encounter (Signed)
 Called pt to confirm appt. Pt did not answer and LVM for upcoming appt.

## 2024-03-25 ENCOUNTER — Encounter: Payer: Self-pay | Admitting: Pharmacist

## 2024-03-25 ENCOUNTER — Ambulatory Visit: Payer: Self-pay | Attending: Family Medicine | Admitting: Pharmacist

## 2024-03-25 ENCOUNTER — Other Ambulatory Visit: Payer: Self-pay

## 2024-03-25 DIAGNOSIS — Z603 Acculturation difficulty: Secondary | ICD-10-CM

## 2024-03-25 DIAGNOSIS — E119 Type 2 diabetes mellitus without complications: Secondary | ICD-10-CM

## 2024-03-25 DIAGNOSIS — Z7984 Long term (current) use of oral hypoglycemic drugs: Secondary | ICD-10-CM

## 2024-03-25 DIAGNOSIS — Z7985 Long-term (current) use of injectable non-insulin antidiabetic drugs: Secondary | ICD-10-CM

## 2024-03-25 MED ORDER — TRULICITY 1.5 MG/0.5ML ~~LOC~~ SOAJ
1.5000 mg | SUBCUTANEOUS | 1 refills | Status: DC
  Filled 2024-03-25: qty 2, 28d supply, fill #0

## 2024-03-25 NOTE — Progress Notes (Signed)
 S:     No chief complaint on file.  63 y.o. female who presents for diabetes evaluation, education, and management. PMH is significant for T2DM, HLD, HTN.  No history of ASCVD, CKD, or HF.  Patient was referred and last seen by Primary Care Provider, Rosaline Bohr, NP, on 11/29/2023. Last visit with pharmacy was on 02/22/2024. At this time, she reported adherence to her DM regimen as prescribed. Herlene, PharmD decreased metformin  XR from 2000 mg daily to 1500 mg daily due to patient not being able to take four large tablets. Patient was started on Trulicity  0.75 mg once weekly.  Patient today arrives in good spirits and presents without any assistance. Patient is accompanied by herself. Spanish interpreter [place], [place] #[id number], was used for today's visit. Patient reports adherence to her medication regimen. Denies any major side effects with starting Trulicity . She does feel like she gets nauseous, but it goes away. Reports having genital itching that can be fixed with fluconazole , but itching sensation will come back after ~3 days. Requested a referral for a dentist appointment to get teeth looked at.  Family/Social History:  Fhx: DM (both parents), HTN Tobacco: former smoker (stopped 1.5 years ago) Alcohol: none   Current diabetes medications include: Metformin  XR 1500 mg daily, Farxiga  10 mg daily, Trulicity  0.75 mg once weekly  Current hyperlipidemia medications include: Rosuvastatin  10 mg daily  Patient reports adherence to taking all medications as prescribed.   Insurance coverage: None, self-pay  Patient denies hypoglycemic events.  Reported home fasting blood sugars: ~ 118-124, notices that FBG will go up when she is on vacation  Patient reported dietary habits: Eats 3 meals/day Breakfast: eggs, spinach, cheese, half avocado, 1 slice whole bread, coffee with almond milk and stevia Lunch: lentils, veggies with proteins, fruit (pear, orange, grapefruit) Dinner: eggs, spinach,  veggies, fruit, homeade bread, greek yogurt Snacks: not often, maybe popcorn or pistachios Drinks: water (6 bottles/day), [place] water (2 jugs/day), coffee (1x/day), no sugar beverages  Patient-reported exercise habits: Uses the stationary bike at home and walking - has not been to the gym in a while  O:   ROS  Physical Exam   Lab Results  Component Value Date   HGBA1C 6.6 02/22/2024   There were no vitals filed for this visit.  Lipid Panel     Component Value Date/Time   CHOL 154 11/29/2023 0851   TRIG 101 11/29/2023 0851   HDL 60 11/29/2023 0851   CHOLHDL 2.6 11/29/2023 0851   LDLCALC 76 11/29/2023 0851    Clinical Atherosclerotic Cardiovascular Disease (ASCVD): No  The 10-year ASCVD risk score (Arnett DK, et al., 2019) is: 8.5%   Values used to calculate the score:     Age: 44 years     Clincally relevant sex: Female     Is Non-Hispanic African American: No     Diabetic: Yes     Tobacco smoker: No     Systolic Blood Pressure: 130 mmHg     Is BP treated: Yes     HDL Cholesterol: 60 mg/dL     Total Cholesterol: 154 mg/dL   A/P: Diabetes longstanding currently controlled with an A1c of 6.6%. Medication adherence appears to be great! She is currently asymptomatic. Patient is able to verbalize appropriate medication regimen and hypoglycemia management plan.  -Continued GLP-1 Trulicity  (dulaglutide ) 0.75 mg once weekly. Side effects are not unbearable and amendable to increasing dose. Plan to initiate 1.5 mg once weekly when patient uses up 0.75 mg pens (~  about 3 weeks left). Counseled on mechanism of action and side effects of medication. Noted that it was common to feel nauseous as it is working to slow down stomach and what she is eating. Recommend follow up after she has started increased dose of Trulicity . -Discontinued SGLT2-I Farxiga  (dapagliflozin ) 10 mg. Based on recurrent side effects and A1c control, discontinue today to alleviate side effects.  -Continued  metformin  XR 1500 mg in the evening with meals. No symptoms experienced with the decreased dose. -Extensively discussed pathophysiology of diabetes, recommended lifestyle interventions, dietary effects on blood sugar control.  -Counseled on s/sx of and management of hypoglycemia.  -Next A1c anticipated October 2025.   Written patient instructions provided. Patient verbalized understanding of treatment plan.  Total time in face to face counseling 30 minutes.    Follow-up:  Pharmacist on 05/06/2024 PCP clinic visit in 05/30/24 with Rosaline Bohr, NP  Jenkins Graces, PharmD PGY1 Pharmacy Resident

## 2024-04-03 ENCOUNTER — Other Ambulatory Visit: Payer: Self-pay

## 2024-04-17 ENCOUNTER — Other Ambulatory Visit: Payer: Self-pay

## 2024-04-18 ENCOUNTER — Ambulatory Visit: Payer: Self-pay | Admitting: *Deleted

## 2024-04-18 ENCOUNTER — Other Ambulatory Visit: Payer: Self-pay

## 2024-04-18 ENCOUNTER — Ambulatory Visit
Admission: RE | Admit: 2024-04-18 | Discharge: 2024-04-18 | Disposition: A | Discharge status: Home / Self Care | Payer: Self-pay | Source: Ambulatory Visit | Attending: Obstetrics and Gynecology | Admitting: Obstetrics and Gynecology

## 2024-04-18 VITALS — BP 131/63 | Wt 195.0 lb

## 2024-04-18 DIAGNOSIS — Z1239 Encounter for other screening for malignant neoplasm of breast: Secondary | ICD-10-CM

## 2024-04-18 DIAGNOSIS — Z1231 Encounter for screening mammogram for malignant neoplasm of breast: Secondary | ICD-10-CM

## 2024-04-18 DIAGNOSIS — Z122 Encounter for screening for malignant neoplasm of respiratory organs: Secondary | ICD-10-CM

## 2024-04-18 DIAGNOSIS — Z1211 Encounter for screening for malignant neoplasm of colon: Secondary | ICD-10-CM

## 2024-04-18 NOTE — Progress Notes (Signed)
 [name] is a 63 y.o. female who presents to [facility] clinic today with no complaints.    Pap Smear: Pap smear not completed today. Last Pap smear was 05/05/2023 at the free cervical cancer screening clinic and was normal with negative HPV. Per patient has no history of an abnormal Pap smear. Last Pap smear result is available in Epic.   Physical exam: Breasts Breasts symmetrical. No skin abnormalities bilateral breasts. No nipple retraction bilateral breasts. No nipple discharge bilateral breasts. No lymphadenopathy. No lumps palpated bilateral breasts. No complaints of pain or tenderness on exam.     MS 3D DIAG MAMMO UNI LT BR (aka MM) Result Date: 03/06/2023 CLINICAL DATA:  Recalled from screening for left breast mass. EXAM: DIGITAL DIAGNOSTIC UNILATERAL LEFT MAMMOGRAM WITH TOMOSYNTHESIS AND CAD; ULTRASOUND LEFT BREAST LIMITED TECHNIQUE: Left digital diagnostic mammography and breast tomosynthesis was performed. The images were evaluated with computer-aided detection. ; Targeted ultrasound examination of the left breast was performed. COMPARISON:  Previous exam(s). ACR Breast Density Category c: The breasts are heterogeneously dense, which may obscure small masses. FINDINGS: There is a small oval mass within the outer left breast middle depth further evaluated with additional imaging. Targeted ultrasound is performed, showing a 4 x 4 x 3 mm cyst left breast 3 o'clock position 1 cm from the nipple. IMPRESSION: Left breast cyst. RECOMMENDATION: Screening mammogram in one year.(Code:SM-B-01Y) I have discussed the findings and recommendations with the patient. If applicable, a reminder letter will be sent to the patient regarding the next appointment. BI-RADS CATEGORY  2: Benign. Electronically Signed   By: Bard Moats M.D.   On: 03/06/2023 14:07   MS 3D SCR MAMMO BILAT BR (aka MM) Result Date: 02/13/2023 CLINICAL DATA:  Screening. EXAM: DIGITAL SCREENING BILATERAL MAMMOGRAM WITH TOMOSYNTHESIS  AND CAD TECHNIQUE: Bilateral screening digital craniocaudal and mediolateral oblique mammograms were obtained. Bilateral screening digital breast tomosynthesis was performed. The images were evaluated with computer-aided detection. COMPARISON:  Previous exam(s). ACR Breast Density Category b: There are scattered areas of fibroglandular density. FINDINGS: In the left breast, a possible mass warrants further evaluation. This possible mass is seen within the outer central LEFT breast on cc slice 59. In the right breast, no findings suspicious for malignancy. IMPRESSION: Further evaluation is suggested for a possible mass in the left breast. RECOMMENDATION: Diagnostic mammogram and possibly ultrasound of the left breast. (Code:FI-L-35M) The patient will be contacted regarding the findings, and additional imaging will be scheduled. BI-RADS CATEGORY  0: Incomplete: Need additional imaging evaluation. Electronically Signed   By: Lael Hines M.D.   On: 02/13/2023 14:25   MS DIGITAL SCREENING TOMO BILATERAL Result Date: 12/18/2020 CLINICAL DATA:  Screening. EXAM: DIGITAL SCREENING BILATERAL MAMMOGRAM WITH TOMOSYNTHESIS AND CAD TECHNIQUE: Bilateral screening digital craniocaudal and mediolateral oblique mammograms were obtained. Bilateral screening digital breast tomosynthesis was performed. The images were evaluated with computer-aided detection. COMPARISON:  Previous exam(s). ACR Breast Density Category b: There are scattered areas of fibroglandular density. FINDINGS: There are no findings suspicious for malignancy. The images were evaluated with computer-aided detection. IMPRESSION: No mammographic evidence of malignancy. A result letter of this screening mammogram will be mailed directly to the patient. RECOMMENDATION: Screening mammogram in one year. (Code:SM-B-01Y) BI-RADS CATEGORY  1: Negative. Electronically Signed   By: Delon Music M.D.   On: 12/18/2020 10:55   MS DIGITAL SCREENING TOMO BILATERAL Result  Date: 10/29/2019 CLINICAL DATA:  Screening. EXAM: DIGITAL SCREENING BILATERAL MAMMOGRAM WITH TOMO AND CAD COMPARISON:  Previous exam(s). ACR  Breast Density Category b: There are scattered areas of fibroglandular density. FINDINGS: There are no findings suspicious for malignancy. Images were processed with CAD. IMPRESSION: No mammographic evidence of malignancy. A result letter of this screening mammogram will be mailed directly to the patient. RECOMMENDATION: Screening mammogram in one year. (Code:SM-B-01Y) BI-RADS CATEGORY  1: Negative. Electronically Signed   By: Inocente Ast M.D.   On: 10/29/2019 11:11    Pelvic/Bimanual Pap is not indicated today per BCCCP guidelines.   Smoking History: Patient has is a former smoker that quit 2 years ago. Referred for free lung cancer screening due to her 40 year history of smoking.   Patient Navigation: Patient education provided. Access to services provided for patient through [place] program. Spanish interpreter Bernice Angry from [facility] provided.   Colorectal Cancer Screening: Per patient has had colonoscopy completed on 01/22/2018. FIT Test given to patient to complete. No complaints today.    Breast and Cervical Cancer Risk Assessment: Patient does not have family history of breast cancer, known genetic mutations, or radiation treatment to the chest before age 23. Patient does not have history of cervical dysplasia, immunocompromised, or DES exposure in-utero.  Risk Scores as of Encounter on 04/18/2024     Frances Martin           5-year 1.55%   Lifetime 7%   This patient is [place]/Latina but has no documented birth country, so the [place] model used data from [place] patients to calculate their risk score. Document a birth country in the Demographics activity for a more accurate score.         Last calculated by Silas, Ansyi K, CMA on 04/18/2024 at  9:21 AM        A: BCCCP exam without pap smear No complaints.  P: Referred patient to the [facility] for a screening mammogram on mobile unit. Appointment scheduled Thursday, April 18, 2024 at 0900.  Driscilla Wanda SQUIBB, RN 04/18/2024 9:27 AM

## 2024-04-18 NOTE — Patient Instructions (Signed)
 Explained breast self awareness with Frances Martin. Patient did not need a Pap smear today due to last Pap smear and HPV typing was 05/05/2023.  Let her know that based on her age and that she has no history of an abnormal Pap smear that she doesn't need any further Pap smears. Referred patient to the [facility] for a screening mammogram on mobile unit. Appointment scheduled Thursday, April 18, 2024 at 0900. Patient aware of appointment and will be there. Let patient know the Breast Center will follow up with her within the next couple weeks with results of her mammogram by letter or phone. [name] verbalized understanding.  [name], Wanda Ship, RN 9:27 AM

## 2024-04-22 ENCOUNTER — Other Ambulatory Visit: Payer: Self-pay

## 2024-04-26 LAB — FECAL OCCULT BLOOD, IMMUNOCHEMICAL: Fecal Occult Bld: NEGATIVE

## 2024-04-29 ENCOUNTER — Ambulatory Visit: Payer: Self-pay

## 2024-05-03 ENCOUNTER — Telehealth: Payer: Self-pay | Admitting: Primary Care

## 2024-05-03 NOTE — Telephone Encounter (Signed)
Confirmed appt for 9/22

## 2024-05-05 NOTE — Progress Notes (Unsigned)
 S:     No chief complaint on file.  63 y.o. female who presents for diabetes evaluation, education, and management. PMH is significant for T2DM, HLD, HTN. No history of ASCVD, CKD, or HF.  Patient was referred and last seen by Primary Care Provider, Rosaline Bohr, NP, on 11/29/2023. Last visit with pharmacy was on 02/22/2024. At this time, she reported adherence to her DM regimen as prescribed. Frances Martin, PharmD decreased metformin  XR from 2000 mg daily to 1500 mg daily due to patient not being able to take four large tablets. Patient was started on Trulicity  0.75 mg once weekly.  Patient last saw pharmacy on 03/25/24. Patient had reported recurrent UTI symptoms. Farxiga  was discontinued. Trulicity  and metformin  remained unchanged and continued.  Patient today arrives in good spirits and presents without any assistance. Patient is accompanied by herself. Spanish interpreter [place], [place] #[id number], was used for today's visit. Patient states that she is doing well. She has not heard from the dentist yet since last appointment. Denies any side effects from medications. Reports that she is doing well on Trulicity . She notices that she is feeling full when she eats certain quantities.  Reports checking her BG at home with morning readings of 126 and evening readings of 138. Denies any s/sx of hypoglycemia or hyperglycemia. Increase in exercising at home. Incorporated more bike exercises and sit ups. Continues to walk as well, but has not gone to the gym as much.   Family/Social History:  Fhx: DM (both parents), HTN Tobacco: former smoker (stopped 1.5 years ago) Alcohol: none   Current diabetes medications include: Metformin  XR 1500 mg daily, Trulicity  1.5 mg once weekly  Current hyperlipidemia medications include: Rosuvastatin  10 mg daily  Patient reports adherence to taking all medications as prescribed.   Insurance coverage: None, self-pay  Patient denies hypoglycemic events.  Patient denies  frequent urination. Patient denies neuropathy (nerve pain). Patient denies visual changes. Patient reports self foot exams.   Patient reported dietary habits: Eats 3 meals/day Breakfast: eggs, spinach, cheese, half avocado, 1 slice whole bread, coffee with almond milk and stevia Lunch: lentils, veggies with proteins, fruit (pear, orange, grapefruit) Dinner: eggs, spinach, veggies, fruit, homeade bread, greek yogurt Snacks: not often, maybe popcorn or pistachios Drinks: water (6 bottles/day), [place] and [place] water (2 jugs/day), coffee (1x/day), no sugar beverages  Patient-reported exercise habits: Uses the stationary bike at home and walking - has not been to the gym in a while  O:   ROS  Physical Exam   Lab Results  Component Value Date   HGBA1C 6.6 02/22/2024   There were no vitals filed for this visit.  Lipid Panel     Component Value Date/Time   CHOL 154 11/29/2023 0851   TRIG 101 11/29/2023 0851   HDL 60 11/29/2023 0851   CHOLHDL 2.6 11/29/2023 0851   LDLCALC 76 11/29/2023 0851    Clinical Atherosclerotic Cardiovascular Disease (ASCVD): No  The 10-year ASCVD risk score (Arnett DK, et al., 2019) is: 8.6%   Values used to calculate the score:     Age: 65 years     Clincally relevant sex: Female     Is Non-Hispanic African American: No     Diabetic: Yes     Tobacco smoker: No     Systolic Blood Pressure: 131 mmHg     Is BP treated: Yes     HDL Cholesterol: 60 mg/dL     Total Cholesterol: 154 mg/dL   A/P: Diabetes longstanding currently controlled with an A1c  of 6.6%. Medication adherence appears to be great! She is currently asymptomatic. Patient is able to verbalize appropriate medication regimen and hypoglycemia management plan.  -Initiate GLP-1 Trulicity  (dulaglutide ) 3 mg once weekly. No recent side effects with 1.5 mg and amendable to increasing to 3 mg. Plan to initiate 3 mg once weekly when patient uses up 0.75 mg pens (~ about 3 weeks left). Counseled on mechanism  of action and side effects of medication. Noted that it was common to feel nauseous as it is working to slow down stomach and what she is eating.  -Continued metformin  XR 1500 mg in the evening with meals. No symptoms experienced with the decreased dose. -Extensively discussed pathophysiology of diabetes, recommended lifestyle interventions, dietary effects on blood sugar control.  -Counseled on s/sx of and management of hypoglycemia.  -Next A1c anticipated October 2025.   Written patient instructions provided. Patient verbalized understanding of treatment plan.  Total time in face to face counseling 30 minutes.    Follow-up:  Pharmacist on 06/10/24 PCP clinic visit in 05/30/24 with Rosaline Bohr, NP  Jenkins Graces, PharmD PGY1 Pharmacy Resident

## 2024-05-06 ENCOUNTER — Other Ambulatory Visit: Payer: Self-pay

## 2024-05-06 ENCOUNTER — Ambulatory Visit: Payer: Self-pay | Attending: Primary Care | Admitting: Pharmacist

## 2024-05-06 ENCOUNTER — Encounter: Payer: Self-pay | Admitting: Pharmacist

## 2024-05-06 DIAGNOSIS — Z7985 Long-term (current) use of injectable non-insulin antidiabetic drugs: Secondary | ICD-10-CM

## 2024-05-06 DIAGNOSIS — E119 Type 2 diabetes mellitus without complications: Secondary | ICD-10-CM

## 2024-05-06 DIAGNOSIS — Z7984 Long term (current) use of oral hypoglycemic drugs: Secondary | ICD-10-CM

## 2024-05-06 MED ORDER — TRULICITY 3 MG/0.5ML ~~LOC~~ SOAJ
3.0000 mg | SUBCUTANEOUS | 6 refills | Status: DC
  Filled 2024-05-06: qty 2, 28d supply, fill #0
  Filled 2024-05-30: qty 2, 28d supply, fill #1
  Filled 2024-06-28: qty 2, 28d supply, fill #2
  Filled 2024-07-26: qty 2, 28d supply, fill #3
  Filled 2024-08-18: qty 2, 28d supply, fill #4

## 2024-05-30 ENCOUNTER — Other Ambulatory Visit: Payer: Self-pay

## 2024-05-30 ENCOUNTER — Ambulatory Visit (INDEPENDENT_AMBULATORY_CARE_PROVIDER_SITE_OTHER): Payer: Self-pay | Admitting: Primary Care

## 2024-05-30 ENCOUNTER — Encounter (INDEPENDENT_AMBULATORY_CARE_PROVIDER_SITE_OTHER): Payer: Self-pay | Admitting: Primary Care

## 2024-05-30 VITALS — BP 125/77 | HR 76 | Resp 16 | Wt 192.4 lb

## 2024-05-30 DIAGNOSIS — E119 Type 2 diabetes mellitus without complications: Secondary | ICD-10-CM

## 2024-05-30 DIAGNOSIS — E1169 Type 2 diabetes mellitus with other specified complication: Secondary | ICD-10-CM

## 2024-05-30 DIAGNOSIS — E785 Hyperlipidemia, unspecified: Secondary | ICD-10-CM

## 2024-05-30 DIAGNOSIS — Z760 Encounter for issue of repeat prescription: Secondary | ICD-10-CM

## 2024-05-30 DIAGNOSIS — G8929 Other chronic pain: Secondary | ICD-10-CM

## 2024-05-30 DIAGNOSIS — I10 Essential (primary) hypertension: Secondary | ICD-10-CM

## 2024-05-30 DIAGNOSIS — M25512 Pain in left shoulder: Secondary | ICD-10-CM

## 2024-05-30 DIAGNOSIS — M2550 Pain in unspecified joint: Secondary | ICD-10-CM

## 2024-05-30 MED ORDER — SERTRALINE HCL 25 MG PO TABS
25.0000 mg | ORAL_TABLET | Freq: Every day | ORAL | 1 refills | Status: DC
  Filled 2024-05-30 – 2024-07-16 (×2): qty 90, 90d supply, fill #0

## 2024-05-30 MED ORDER — LOSARTAN POTASSIUM 50 MG PO TABS
50.0000 mg | ORAL_TABLET | Freq: Every day | ORAL | 1 refills | Status: AC
  Filled 2024-05-30 – 2024-06-10 (×2): qty 90, 90d supply, fill #0
  Filled 2024-08-31: qty 90, 90d supply, fill #1

## 2024-05-30 MED ORDER — METFORMIN HCL ER 500 MG PO TB24
1500.0000 mg | ORAL_TABLET | Freq: Every day | ORAL | 1 refills | Status: DC
  Filled 2024-05-30: qty 270, 90d supply, fill #0

## 2024-05-30 MED ORDER — CELECOXIB 200 MG PO CAPS
200.0000 mg | ORAL_CAPSULE | Freq: Two times a day (BID) | ORAL | 1 refills | Status: DC
  Filled 2024-05-30 – 2024-08-18 (×2): qty 180, 90d supply, fill #0

## 2024-05-30 NOTE — Patient Instructions (Addendum)
 [facility] [address] Jama Harpin at  [facility] Ph# [phone] Fax # [phone] Address [address]     Rosuvastatin  Tablets Michelina es [place] medicamento? La ROSUVASTATINA se usa  para tratar el colesterol elevado y reduce el riesgo de ataque cardiaco y accidente cerebrovascular. Acta disminuyendo el colesterol malo y las grasas (como los LDL y los triglicridos), y aumentando el colesterol bueno (HDL) en la [place]. Pertenece a un grupo de medicamentos llamados estatinas. Con frecuencia, este medicamento se combina con cambios en la dieta y el ejercicio. Este medicamento puede ser utilizado para otros usos; si tiene alguna pregunta consulte con su proveedor de atencin mdica o con su farmacutico. MARCAS COMUNES: Crestor  Qu le debo informar a mi profesional de la salud antes de tomar este medicamento? Necesitan saber si usted presenta alguna de las siguientes situaciones: Diabetes Consume bebidas alcohlicas con frecuencia Enfermedad renal Enfermedad heptica Dolor o calambres musculares Enfermedad tiroidea Una reaccin alrgica o inusual a la rosuvastatina, a otros medicamentos, alimentos, colorantes o conservantes Si est en embarazo o intentando quedar en embarazo Si est amamantando Cmo debo utilizar este medicamento? Tome este medicamento por va oral con agua. Use el medicamento segn las instrucciones en la etiqueta a la misma hora todos [place]. Se puede tomar con o sin alimentos. Si el Social worker, tmelo con alimentos. Siga usndolo a menos que su equipo de atencin [employer] indique que lo suspenda. Use los anticidos que contengan hidrxido de aluminio y de magnesio en un horario distinto al horario en que usa  este medicamento. Tome anticidos 2 horas despus de Financial risk analyst. Consulte con su equipo de atencin sobre el uso de este medicamento en nios. Aunque se puede recetar a nios tan pequeos como de 7 aos de edad con ciertas  afecciones, existen precauciones que deben tomarse. Sobredosis: Pngase en contacto inmediatamente con un centro toxicolgico o una sala de urgencia si usted cree que haya tomado demasiado medicamento.<br>ATENCIN: [employer] es solo para usted. No comparta este medicamento con nadie. Qu sucede si me olvido de una dosis? Si olvida una dosis, omita la dosis olvidada. Tome la prxima dosis a la hora habitual. No tome una dosis adicional ni 2 dosis a la vez para compensar la dosis que olvid. Qu puede interactuar con este medicamento? No use este medicamento con ninguno de los siguientes productos: Arroz de levadura roja Este medicamento tambin podra interactuar con los siguientes productos: Alcohol Anticidos que contienen hidrxido de aluminio y de magnesio Colchicina Ciclosporina Febuxostat Fostamatinib Algunos medicamentos para el cncer, tales como capmatinib, darolutamida, enasidenib, regorafenib Algunos medicamentos para el VIH o la hepatitis Otros medicamentos para el colesterol Tafamidis Teriflunomida Warfarina Este medicamento puede afectar la forma en que actan otros medicamentos y otros medicamentos pueden afectar la forma en que acta este medicamento. Informe a su equipo de atencin mdica sobre todos los United Parcel que usa . Es posible que le sugieran cambios en su plan de tratamiento para reducir el riesgo de efectos secundarios y asegurarse de que los medicamentos actan del modo previsto. Puede ser que esta lista no menciona todas las posibles interacciones. Informe a su profesional de [employer] de [employer] Inc productos a base de hierbas, medicamentos de [place] o suplementos nutritivos que est tomando. Si usted fuma, consume bebidas alcohlicas o si utiliza drogas ilegales, indqueselo tambin a su profesional de [employer]. Algunas sustancias pueden interactuar con su medicamento. A qu debo estar atento al usar PPL Corporation? Visite a  su equipo de atencin  para que evale su progreso peridicamente. Es posible que necesite realizarse anlisis de sangre mientras est en tratamiento con este medicamento. Usar P[employer] Corporation es solo una parte de un programa completo de salud cardaca. Consulte a su equipo de atencin mdica si existen otros cambios que usted pueda implementar para mejorar su salud en general. Su equipo de atencin puede indicarle que deje de usar este medicamento si desarrolla problemas musculares. Si sus problemas musculares no desaparecen despus de dejar de usar este medicamento, contacte a su equipo de atencin. Si tiene planificada una ciruga o un procedimiento, informe a su equipo de atencin mdica que usa  este medicamento. Este medicamento puede aumentar los niveles de [place]. El riesgo es mayor en pacientes que ya tienen diabetes. Pregunte a su equipo de atencin mdica qu puede hacer para disminuir el riesgo de diabetes mientras toma este medicamento. Hable con su equipo de atencin si existe la posibilidad de que est en embarazo. Este medicamento puede causar defectos congnitos graves si se usa  durante el embarazo. Existen beneficios y Herbalist al tomar medicamentos durante el embarazo. Su equipo de atencin mdica puede ayudarle a Clinical research associate la opcin que mejor se adapte a sus necesidades. Consulte con su equipo de atencin mdica antes de Museum/gallery exhibitions officer. Es posible que sea Passenger transport manager cambios en su plan de tratamiento. Qu efectos secundarios puedo tener al [employer] este medicamento? Efectos secundarios que debe informar a su equipo de atencin tan pronto como sea posible: Reacciones alrgicas: erupcin cutnea, comezn/picazn, urticaria, hinchazn de la cara, los labios, la lengua o la garganta Niveles elevados de azcar en la sangre (hiperglucemia): aumento de la sed o de la cantidad de orina, debilidad inusual, fatiga, visin borrosa Lesin en el hgado: dolor en la regin abdominal superior derecha,  prdida de apetito, nuseas, heces de color claro, orina amarilla oscura o marrn, color amarillento de los ojos o la piel, debilidad inusual, fatiga Lesin muscular: debilidad inusual, fatiga, dolor muscular, orina amarilla oscura o marrn, disminucin de la cantidad de comoros Enrojecimiento, formacin de Chartered loss adjuster, Administrator o distensin de la piel, incluso dentro de la boca Efectos secundarios que generalmente no requieren atencin mdica (debe informarlos a su equipo de atencin si persisten o si son molestos): Fatiga Dolor de cabeza Nuseas Dolor estomacal Puede ser que esta lista no menciona todos los posibles efectos secundarios. Comunquese a su mdico por asesoramiento mdico [employer]lett[employer]ard. Usted puede informar los efectos secundarios a la FDA por telfono al 1-800-FDA-1088. Dnde debo guardar mi medicina? Mantenga fuera del alcance de nios y Neurosurgeon. Almacene a temperatura ambiente, entre 20 y 25 grados Celsius (68 y 87 grados Fahrenheit). Proteja de la humedad. Mantenga el envase bien cerrado. Deseche todo el medicamento que no haya utilizado despus de la fecha de vencimiento. Para desechar los medicamentos que ya no necesite o que estn vencidos: Lleve el medicamento a un programa de recuperacin de medicamentos. Consulte con su farmacia o con una entidad reguladora para encontrar un lugar donde llevarlo. Si no puede devolver el medicamento, consulte la etiqueta o el prospecto del envase para verificar si debe desecharlo en la basura o arrojarlo por el sanitario. Si tiene dudas, pregunte a su equipo de atencin mdica. Si es seguro desecharlo en la basura, saque el medicamento del envase. Mzclelo con arena para gatos, tierra, caf molido u otra sustancia no deseada. Coloque la mezcla en una bolsa o recipiente que quede [place]. Deseche en la basura. ATENCIN: Este folleto  es Designer, multimedia. Puede ser que no cubra toda la posible informacin. Si usted tiene preguntas  acerca de esta medicina, consulte con su mdico, su farmacutico o su profesional de Radiographer, therapeutic.  2025 Elsevier/Gold Standard (2023-11-08 00:00:00)

## 2024-05-30 NOTE — Progress Notes (Signed)
 Subjective:  [name], female    DOB: 16-Mar-1961  Age: 63 y.o. MRN: [id number]  CC: Diabetes, Hypertension, and Hyperlipidemia   Frances Martin presents forFollow-up of diabetes. Patient does not check blood sugar at home HPI  Compliant with meds - Yes Checking CBGs? Yes  Fasting avg -   Postprandial average -  Exercising regularly? - Yes Watching carbohydrate intake? - Yes Neuropathy ? - No Hypoglycemic events - No  - Recovers with :   Pertinent ROS:  Polyuria - No Polydipsia - No Vision problems - No  Hypertension well controlled-  Patient has No headache, No chest pain, No abdominal pain - No Nausea, No new weakness tingling or numbness, No Cough - shortness of breath  Medications as noted below. Taking them regularly without complication/adverse reaction being reported today.   Hyperlipidemia  History Frances Martin has a past medical history of Hemorrhoids, Hyperlipidemia, Hypertension, and Pre-diabetes.   Frances Martin has a past surgical history that includes Tubal ligation; Colonoscopy with propofol  (N/A, 11/14/2017); Colonoscopy with propofol  (N/A, 01/22/2018); and Cesarean section.   Frances Martin family history includes Diabetes in Frances Martin father and mother; Hypertension in Frances Martin mother.Frances Martin reports that Frances Martin quit smoking about 46 years ago. Frances Martin smoking use included cigarettes. Frances Martin started smoking about 3 years ago. Frances Martin has a 1.9 pack-year smoking history. Frances Martin has never used smokeless tobacco. Frances Martin reports that Frances Martin does not drink alcohol and does not use drugs.  Current Outpatient Medications on File Prior to Visit  Medication Sig Dispense Refill   Collagen-Vitamin C-Biotin (COLLAGEN PO) Take by mouth.     Cyanocobalamin (B-12 PO) Take by mouth.     Dulaglutide  (TRULICITY ) 3 MG/0.5ML SOAJ Inject 3 mg as directed once a week. 2 mL 6   omega-3 acid ethyl esters (LOVAZA) 1 g capsule Take by mouth 2 (two) times daily.     rosuvastatin  (CRESTOR ) 10 MG tablet Take 1 tablet  (10 mg total) by mouth daily. 90 tablet 3   VITAMIN D  PO Take by mouth.     No current facility-administered medications on file prior to visit.    Review of Systems Comprehensive ROS Pertinent positive and negative noted in HPI   Objective:  BP 125/77   Pulse 76   Resp 16   Frances Martin 192 lb 6.4 oz (87.3 kg)   SpO2 100%   BMI 33.55 kg/m   BP Readings from Last 3 Encounters:  05/30/24 125/77  04/18/24 131/63  11/29/23 130/76    Frances Martin Readings from Last 3 Encounters:  05/30/24 192 lb 6.4 oz (87.3 kg)  04/18/24 195 lb (88.5 kg)  11/29/23 199 lb 12.8 oz (90.6 kg)    Physical Exam Vitals reviewed.  Constitutional:      Appearance: Normal appearance. Frances Martin is obese.  HENT:     Head: Normocephalic.     Right Ear: Tympanic membrane, ear canal and external ear normal.     Left Ear: Tympanic membrane, ear canal and external ear normal.     Nose: Nose normal.     Mouth/Throat:     Mouth: Mucous membranes are moist.  Eyes:     Extraocular Movements: Extraocular movements intact.     Pupils: Pupils are equal, round, and reactive to light.  Cardiovascular:     Rate and Rhythm: Normal rate.  Pulmonary:     Effort: Pulmonary effort is normal.     Breath sounds: Normal breath sounds.  Abdominal:     General: Bowel sounds are normal.  Palpations: Abdomen is soft.  Musculoskeletal:        General: Normal range of motion.     Cervical back: Normal range of motion.  Skin:    General: Skin is warm and dry.  Neurological:     Mental Status: Frances Martin is alert and oriented to person, place, and time.  Psychiatric:        Mood and Affect: Mood normal.        Behavior: Behavior normal.        Thought Content: Thought content normal.     Lab Results  Component Value Date   HGBA1C 6.7 06/10/2024   HGBA1C 6.6 02/22/2024   HGBA1C 7.0 (H) 11/29/2023    Lab Results  Component Value Date   WBC 8.2 05/30/2024   HGB 13.3 05/30/2024   HCT 41.8 05/30/2024   PLT 346 05/30/2024   GLUCOSE 90  05/30/2024   CHOL 132 05/30/2024   TRIG 100 05/30/2024   HDL 55 05/30/2024   LDLCALC 58 05/30/2024   ALT 23 05/30/2024   AST 18 05/30/2024   NA 139 05/30/2024   K 4.5 05/30/2024   CL 102 05/30/2024   CREATININE 0.69 05/30/2024   BUN 14 05/30/2024   CO2 22 05/30/2024   TSH 2.200 04/14/2017   HGBA1C 6.7 06/10/2024    Title   Diabetic Foot Exam - detailed    Semmes-Weinstein Monofilament Test + means has sensation and - means no sensation      Image components are not supported.   Image components are not supported. Image components are not supported.  [place] Comments      Assessment & Plan:  Frances Martin was seen today for diabetes, hypertension and hyperlipidemia.  Diagnoses and all orders for this visit:  Type 2 diabetes mellitus without complication, without long-term current use of insulin (HCC)  monitor carbohydrates -rice, potatoes, tortillas, [place] Corn Masa Flour, breads, pasta, sweets, sodas.  Increase exercising to help maintain appropriate weight.  -     CMP14+EGFR -     Lipid panel -     Discontinue: metFORMIN  (GLUCOPHAGE -XR) 500 MG 24 hr tablet; Take 3 tablets (1,500 mg total) by mouth daily.  Essential hypertension WELL CONTROLLED -     CBC with Differential/Platelet -     CMP14+EGFR -     losartan  (COZAAR ) 50 MG tablet; Take 1 tablet (50 mg total) by mouth daily.  Hyperlipidemia associated with type 2 diabetes mellitus (HCC)  Healthy lifestyle diet of fruits vegetables fish nuts whole grains and low saturated fat . Foods high in cholesterol or liver, fatty meats,cheese, butter avocados, nuts and seeds, chocolate and fried foods. -     CMP14+EGFR -     Lipid panel  Medication refill -     losartan  (COZAAR ) 50 MG tablet; Take 1 tablet (50 mg total) by mouth daily. -     celecoxib  (CELEBREX ) 200 MG capsule; Take 1 capsule (200 mg total) by mouth 2 (two) times daily.  Chronic left shoulder pain 2/2 Arthralgia, unspecified joint -      celecoxib  (CELEBREX ) 200 MG capsule; Take 1 capsule (200 mg total) by mouth 2 (two) times daily.  Other orders -     sertraline  (ZOLOFT ) 25 MG tablet; Take 1 tablet (25 mg total) by mouth daily.     Follow-up:  Return in about 3 months (around 08/30/2024) for fasting labs.  The above assessment and management plan was discussed with the patient. The patient verbalized understanding of and has  agreed to the management plan. Patient is aware to call the clinic if symptoms fail to improve or worsen. Patient is aware when to return to the clinic for a follow-up visit. Patient educated on when it is appropriate to go to the emergency department.   Rosaline Bohr, NP-C

## 2024-05-31 LAB — CMP14+EGFR
ALT: 23 IU/L (ref 0–32)
AST: 18 IU/L (ref 0–40)
Albumin: 4.2 g/dL (ref 3.9–4.9)
Alkaline Phosphatase: 85 IU/L (ref 49–135)
BUN/Creatinine Ratio: 20 (ref 12–28)
BUN: 14 mg/dL (ref 8–27)
Bilirubin Total: 0.3 mg/dL (ref 0.0–1.2)
CO2: 22 mmol/L (ref 20–29)
Calcium: 9.1 mg/dL (ref 8.7–10.3)
Chloride: 102 mmol/L (ref 96–106)
Creatinine, Ser: 0.69 mg/dL (ref 0.57–1.00)
Globulin, Total: 3.2 g/dL (ref 1.5–4.5)
Glucose: 90 mg/dL (ref 70–99)
Potassium: 4.5 mmol/L (ref 3.5–5.2)
Sodium: 139 mmol/L (ref 134–144)
Total Protein: 7.4 g/dL (ref 6.0–8.5)
eGFR: 98 mL/min/1.73 (ref >59)

## 2024-05-31 LAB — CBC WITH DIFFERENTIAL/PLATELET
Basophils Absolute: 0.1 x10E3/uL (ref 0.0–0.2)
Basos: 1 %
EOS (ABSOLUTE): 0.1 x10E3/uL (ref 0.0–0.4)
Eos: 1 %
Hematocrit: 41.8 % (ref 34.0–46.6)
Hemoglobin: 13.3 g/dL (ref 11.1–15.9)
Immature Grans (Abs): 0 x10E3/uL (ref 0.0–0.1)
Immature Granulocytes: 0 %
Lymphocytes Absolute: 2.8 x10E3/uL (ref 0.7–3.1)
Lymphs: 35 %
MCH: 27.4 pg (ref 26.6–33.0)
MCHC: 31.8 g/dL (ref 31.5–35.7)
MCV: 86 fL (ref 79–97)
Monocytes Absolute: 0.4 x10E3/uL (ref 0.1–0.9)
Monocytes: 5 %
Neutrophils Absolute: 4.7 x10E3/uL (ref 1.4–7.0)
Neutrophils: 58 %
Platelets: 346 x10E3/uL (ref 150–450)
RBC: 4.85 x10E6/uL (ref 3.77–5.28)
RDW: 15.7 % — ABNORMAL HIGH (ref 11.7–15.4)
WBC: 8.2 x10E3/uL (ref 3.4–10.8)

## 2024-05-31 LAB — LIPID PANEL
Chol/HDL Ratio: 2.4 ratio (ref 0.0–4.4)
Cholesterol, Total: 132 mg/dL (ref 100–199)
HDL: 55 mg/dL (ref >39)
LDL Chol Calc (NIH): 58 mg/dL (ref 0–99)
Triglycerides: 100 mg/dL (ref 0–149)
VLDL Cholesterol Cal: 19 mg/dL (ref 5–40)

## 2024-06-03 ENCOUNTER — Ambulatory Visit (INDEPENDENT_AMBULATORY_CARE_PROVIDER_SITE_OTHER): Payer: Self-pay | Admitting: Primary Care

## 2024-06-10 ENCOUNTER — Other Ambulatory Visit: Payer: Self-pay

## 2024-06-10 ENCOUNTER — Encounter: Payer: Self-pay | Admitting: Pharmacist

## 2024-06-10 ENCOUNTER — Ambulatory Visit: Payer: Self-pay | Attending: Primary Care | Admitting: Pharmacist

## 2024-06-10 DIAGNOSIS — Z7985 Long-term (current) use of injectable non-insulin antidiabetic drugs: Secondary | ICD-10-CM

## 2024-06-10 DIAGNOSIS — Z7984 Long term (current) use of oral hypoglycemic drugs: Secondary | ICD-10-CM

## 2024-06-10 DIAGNOSIS — E119 Type 2 diabetes mellitus without complications: Secondary | ICD-10-CM

## 2024-06-10 LAB — POCT GLYCOSYLATED HEMOGLOBIN (HGB A1C): HbA1c, POC (controlled diabetic range): 6.7 % (ref 0.0–7.0)

## 2024-06-10 MED ORDER — METFORMIN HCL ER 500 MG PO TB24
1000.0000 mg | ORAL_TABLET | Freq: Every day | ORAL | 1 refills | Status: DC
  Filled 2024-06-10: qty 270, 135d supply, fill #0
  Filled 2024-06-28: qty 180, 90d supply, fill #0

## 2024-06-10 NOTE — Progress Notes (Signed)
     S:     No chief complaint on file.  63 y.o. female who presents for diabetes evaluation, education, and management. PMH is significant for T2DM, HLD, HTN. No history of ASCVD, CKD, or HF.  Patient was referred and last seen by Primary Care Provider, Rosaline Bohr, NP, on 05/30/2024. Last visit with pharmacy was on 05/06/2024. We increased her Trulicity  to 3 mg weekly.  Patient today arrives in good spirits and presents without any assistance. Spanish interpreter [place], [id number], was used for today's visit. Reports that she is doing well on Trulicity . She notices that she is feeling full when she eats certain quantities. Denies any NV, abdominal pain. Denies any s/sx of hypoglycemia or hyperglycemia.  Family/Social History:  Fhx: DM (both parents), HTN Tobacco: former smoker (stopped 1.5 years ago) Alcohol: none  Current diabetes medications include: Metformin  XR 1500 mg daily, Trulicity  3 mg once weekly  Current hyperlipidemia medications include: Rosuvastatin  10 mg daily Patient reports adherence to taking all medications as prescribed.   Insurance coverage: self-pay  Patient denies hypoglycemic events.  Patient denies frequent urination. Patient denies neuropathy (nerve pain). Patient denies visual changes. Patient reports self foot exams.   Patient reported dietary habits: Eats 3 meals/day Breakfast: eggs, spinach, cheese, half avocado, 1 slice whole bread, coffee with almond milk and stevia Lunch: lentils, veggies with proteins, fruit (pear, orange, grapefruit) Dinner: eggs, spinach, veggies, fruit, homeade bread, greek yogurt Snacks: not often, maybe popcorn or pistachios Drinks: water (6 bottles/day), [place] water (2 jugs/day), coffee (1x/day), no sugar beverages  Patient-reported exercise habits: Uses the stationary bike at home and walking - has not been to the gym in a while  O:   ROS  Physical Exam   Lab Results  Component Value Date   HGBA1C 6.7  06/10/2024   There were no vitals filed for this visit.  Lipid Panel     Component Value Date/Time   CHOL 132 05/30/2024 0918   TRIG 100 05/30/2024 0918   HDL 55 05/30/2024 0918   CHOLHDL 2.4 05/30/2024 0918   LDLCALC 58 05/30/2024 0918    Clinical Atherosclerotic Cardiovascular Disease (ASCVD): No  The 10-year ASCVD risk score (Arnett DK, et al., 2019) is: 7.5%   Values used to calculate the score:     Age: 29 years     Clincally relevant sex: Female     Is Non-Hispanic African American: No     Diabetic: Yes     Tobacco smoker: No     Systolic Blood Pressure: 125 mmHg     Is BP treated: Yes     HDL Cholesterol: 55 mg/dL     Total Cholesterol: 132 mg/dL   A/P: Diabetes longstanding currently controlled with an A1c of 6.7%. Medication adherence is optimal! She is currently asymptomatic. Patient is able to verbalize appropriate medication regimen and hypoglycemia management plan.  -Continue Trulicity  (dulaglutide ) 3 mg once weekly.   -DECREASE metformin  XR to 1000 mg in the evening with meals.  -Extensively discussed pathophysiology of diabetes, recommended lifestyle interventions, dietary effects on blood sugar control.  -Counseled on s/sx of and management of hypoglycemia.  -Next A1c anticipated 1/26.   Written patient instructions provided. Patient verbalized understanding of treatment plan.  Total time in face to face counseling 30 minutes.    Follow-up:  Pharmacist in 3 months PCP clinic visit 09/02/24 with Rosaline Bohr, NP  Herlene Fleeta Morris, PharmD, BCACP, CPP Clinical Pharmacist [facility] [phone]

## 2024-06-28 ENCOUNTER — Other Ambulatory Visit: Payer: Self-pay

## 2024-07-16 ENCOUNTER — Other Ambulatory Visit (INDEPENDENT_AMBULATORY_CARE_PROVIDER_SITE_OTHER): Payer: Self-pay | Admitting: Primary Care

## 2024-07-16 ENCOUNTER — Other Ambulatory Visit: Payer: Self-pay

## 2024-07-16 DIAGNOSIS — K2100 Gastro-esophageal reflux disease with esophagitis, without bleeding: Secondary | ICD-10-CM

## 2024-07-16 NOTE — Telephone Encounter (Signed)
 Will forward to provider

## 2024-07-26 ENCOUNTER — Other Ambulatory Visit: Payer: Self-pay

## 2024-07-26 NOTE — Telephone Encounter (Signed)
 Protonix  is not on current med list

## 2024-07-29 ENCOUNTER — Other Ambulatory Visit: Payer: Self-pay

## 2024-08-04 MED ORDER — PANTOPRAZOLE SODIUM 40 MG PO TBEC
40.0000 mg | DELAYED_RELEASE_TABLET | Freq: Every day | ORAL | 3 refills | Status: DC
  Filled 2024-08-04: qty 30, 30d supply, fill #0

## 2024-08-05 ENCOUNTER — Other Ambulatory Visit: Payer: Self-pay

## 2024-08-06 ENCOUNTER — Other Ambulatory Visit: Payer: Self-pay

## 2024-08-19 ENCOUNTER — Other Ambulatory Visit: Payer: Self-pay

## 2024-08-27 ENCOUNTER — Other Ambulatory Visit: Payer: Self-pay

## 2024-08-30 ENCOUNTER — Telehealth (INDEPENDENT_AMBULATORY_CARE_PROVIDER_SITE_OTHER): Payer: Self-pay | Admitting: Primary Care

## 2024-08-30 NOTE — Telephone Encounter (Signed)
 Spoke to pt about upcoming appt.. Will be present

## 2024-09-02 ENCOUNTER — Ambulatory Visit (INDEPENDENT_AMBULATORY_CARE_PROVIDER_SITE_OTHER): Payer: Self-pay | Admitting: Primary Care

## 2024-09-02 ENCOUNTER — Encounter (INDEPENDENT_AMBULATORY_CARE_PROVIDER_SITE_OTHER): Payer: Self-pay | Admitting: Primary Care

## 2024-09-02 ENCOUNTER — Other Ambulatory Visit: Payer: Self-pay

## 2024-09-02 VITALS — BP 113/68 | HR 77 | Resp 16 | Ht 63.0 in | Wt 193.0 lb

## 2024-09-02 DIAGNOSIS — E119 Type 2 diabetes mellitus without complications: Secondary | ICD-10-CM

## 2024-09-02 DIAGNOSIS — M25512 Pain in left shoulder: Secondary | ICD-10-CM

## 2024-09-02 DIAGNOSIS — L659 Nonscarring hair loss, unspecified: Secondary | ICD-10-CM

## 2024-09-02 DIAGNOSIS — F32A Depression, unspecified: Secondary | ICD-10-CM

## 2024-09-02 DIAGNOSIS — Z760 Encounter for issue of repeat prescription: Secondary | ICD-10-CM

## 2024-09-02 DIAGNOSIS — E559 Vitamin D deficiency, unspecified: Secondary | ICD-10-CM

## 2024-09-02 DIAGNOSIS — E785 Hyperlipidemia, unspecified: Secondary | ICD-10-CM

## 2024-09-02 DIAGNOSIS — Z7984 Long term (current) use of oral hypoglycemic drugs: Secondary | ICD-10-CM

## 2024-09-02 DIAGNOSIS — I10 Essential (primary) hypertension: Secondary | ICD-10-CM

## 2024-09-02 DIAGNOSIS — K219 Gastro-esophageal reflux disease without esophagitis: Secondary | ICD-10-CM

## 2024-09-02 DIAGNOSIS — E1169 Type 2 diabetes mellitus with other specified complication: Secondary | ICD-10-CM

## 2024-09-02 DIAGNOSIS — G8929 Other chronic pain: Secondary | ICD-10-CM

## 2024-09-02 DIAGNOSIS — M2550 Pain in unspecified joint: Secondary | ICD-10-CM

## 2024-09-02 MED ORDER — ROSUVASTATIN CALCIUM 10 MG PO TABS
10.0000 mg | ORAL_TABLET | Freq: Every day | ORAL | 1 refills | Status: AC
  Filled 2024-09-02: qty 90, 90d supply, fill #0

## 2024-09-02 MED ORDER — PANTOPRAZOLE SODIUM 40 MG PO TBEC
40.0000 mg | DELAYED_RELEASE_TABLET | Freq: Every day | ORAL | 1 refills | Status: AC
  Filled 2024-09-02: qty 90, 90d supply, fill #0

## 2024-09-02 MED ORDER — CELECOXIB 200 MG PO CAPS
200.0000 mg | ORAL_CAPSULE | Freq: Two times a day (BID) | ORAL | 1 refills | Status: AC
  Filled 2024-09-02: qty 180, 90d supply, fill #0

## 2024-09-02 MED ORDER — METFORMIN HCL ER 500 MG PO TB24
1000.0000 mg | ORAL_TABLET | Freq: Every day | ORAL | 1 refills | Status: AC
  Filled 2024-09-02: qty 270, 135d supply, fill #0

## 2024-09-02 MED ORDER — LOSARTAN POTASSIUM 50 MG PO TABS
50.0000 mg | ORAL_TABLET | Freq: Every day | ORAL | 1 refills | Status: AC
  Filled 2024-09-02: qty 90, 90d supply, fill #0

## 2024-09-02 MED ORDER — SERTRALINE HCL 25 MG PO TABS
25.0000 mg | ORAL_TABLET | Freq: Every day | ORAL | 1 refills | Status: AC
  Filled 2024-09-02: qty 90, 90d supply, fill #0

## 2024-09-02 MED ORDER — TRULICITY 3 MG/0.5ML ~~LOC~~ SOAJ
3.0000 mg | SUBCUTANEOUS | 6 refills | Status: DC
  Filled 2024-09-02: qty 2, 28d supply, fill #0

## 2024-09-02 NOTE — Progress Notes (Signed)
 " [facility]  Frances Martin, is a 64 y.o. female  RDW:[id number]  FMW:[id number]  DOB - 26-Dec-1960  Chief Complaint  Patient presents with   Diabetes   Hypertension       Subjective:   Frances Martin is a 64 y.o. Hispanic  obese female (interpreter [place] [phone] ) here today for a follow up visit. Management of HTN- well controlled. Patient has No headache, No chest pain, No abdominal pain - No Nausea, No new weakness tingling or numbness, No Cough - shortness of breath T2D- Denies polyuria, polydipsia, polyphasia or vision changes.  Does check blood sugars at home. 93-104 fasting.  Diabetic foot exam deferred to next visit ferro.  No problems updated.  Comprehensive ROS Pertinent positive and negative noted in HPI   Allergies[1]  Past Medical History:  Diagnosis Date   Hemorrhoids    Hyperlipidemia    Hypertension    Pre-diabetes     Medications Ordered Prior to Encounter[2] Health Maintenance  Topic Date Due   Eye exam for diabetics  Never done   COVID-19 Vaccine (1 - 2025-26 season) Never done   Complete foot exam   08/27/2024   Flu Shot  11/12/2024*   Kidney health urinalysis for diabetes  11/28/2024   Hemoglobin A1C  12/09/2024   Yearly kidney function blood test for diabetes  05/30/2025   Breast Cancer Screening  04/18/2026   DTaP/Tdap/Td vaccine (2 - Td or Tdap) 04/14/2027   Colon Cancer Screening  01/23/2028   Pap with HPV screening  05/04/2028   Pneumococcal Vaccine for age over 64  Completed   HPV Vaccine (No Doses Required) Completed   Hepatitis C Screening  Completed   HIV Screening  Completed   Zoster (Shingles) Vaccine  Completed   Hepatitis B Vaccine  Aged Out   Meningitis B Vaccine  Aged Out  *Topic was postponed. The date shown is not the original due date.    Objective:   Vitals:   09/02/24 0901  BP: 113/68  Pulse: 77  Resp: 16  SpO2: 95%  Weight: 193 lb (87.5 kg)  Height: 5' 3 (1.6 m)   BP Readings  from Last 3 Encounters:  09/02/24 113/68  05/30/24 125/77  04/18/24 131/63      Physical Exam Vitals reviewed.  Constitutional:      Appearance: Normal appearance. She is obese.  HENT:     Head: Normocephalic.     Right Ear: Tympanic membrane, ear canal and external ear normal.     Left Ear: Tympanic membrane, ear canal and external ear normal.     Nose: Nose normal.     Mouth/Throat:     Mouth: Mucous membranes are moist.  Eyes:     Extraocular Movements: Extraocular movements intact.     Pupils: Pupils are equal, round, and reactive to light.  Cardiovascular:     Rate and Rhythm: Normal rate.  Pulmonary:     Effort: Pulmonary effort is normal.     Breath sounds: Normal breath sounds.  Abdominal:     General: Bowel sounds are normal.     Palpations: Abdomen is soft.  Musculoskeletal:        General: Normal range of motion.     Cervical back: Normal range of motion.  Skin:    General: Skin is warm and dry.  Neurological:     Mental Status: She is alert and oriented to person, place, and time.  Psychiatric:        Mood  and Affect: Mood normal.        Behavior: Behavior normal.        Thought Content: Thought content normal.     Assessment & Plan  Frances Martin was seen today for diabetes and hypertension.  Diagnoses and all orders for this visit:  Type 2 diabetes mellitus without complication, without long-term current use of insulin (HCC) - educated on lifestyle modifications, including but not limited to diet choices and adding exercise to daily routine.   -     CBC with Differential/Platelet; Future -     Hemoglobin A1c; Future -     metFORMIN  (GLUCOPHAGE -XR) 500 MG 24 hr tablet; Take 2 tablets (1,000 mg total) by mouth daily.  Medication refill -     celecoxib  (CELEBREX ) 200 MG capsule; Take 1 capsule (200 mg total) by mouth 2 (two) times daily. -     Dulaglutide  (TRULICITY ) 3 MG/0.5ML SOAJ; Inject 3 mg as directed once a week. -     losartan  (COZAAR ) 50 MG  tablet; Take 1 tablet (50 mg total) by mouth daily. -     metFORMIN  (GLUCOPHAGE -XR) 500 MG 24 hr tablet; Take 2 tablets (1,000 mg total) by mouth daily. -     pantoprazole  (PROTONIX ) 40 MG tablet; Take 1 tablet (40 mg total) by mouth daily. -     rosuvastatin  (CRESTOR ) 10 MG tablet; Take 1 tablet (10 mg total) by mouth daily. -     sertraline  (ZOLOFT ) 25 MG tablet; Take 1 tablet (25 mg total) by mouth daily.  Chronic left shoulder pain 2/2 Arthralgia, unspecified joint -     celecoxib  (CELEBREX ) 200 MG capsule; Take 1 capsule (200 mg total) by mouth 2 (two) times daily.  Essential hypertension Well controlled  DIET: Limit salt intake, read nutrition labels to check salt content, limit fried and high fatty foods  Avoid using multisymptom OTC cold preparations that generally contain sudafed which can rise BP. Consult with pharmacist on best cold relief products to use for persons with HTN EXERCISE Discussed incorporating exercise such as walking - 30 minutes most days of the week and can do in 10 minute intervals    -     CMP14+EGFR; Future -     losartan  (COZAAR ) 50 MG tablet; Take 1 tablet (50 mg total) by mouth daily.  Gastroesophageal reflux disease without esophagitis -     pantoprazole  (PROTONIX ) 40 MG tablet; Take 1 tablet (40 mg total) by mouth daily.  Hyperlipidemia associated with type 2 diabetes mellitus (HCC) -     Lipid panel; Future -     rosuvastatin  (CRESTOR ) 10 MG tablet; Take 1 tablet (10 mg total) by mouth daily.  Depression, unspecified depression type -     sertraline  (ZOLOFT ) 25 MG tablet; Take 1 tablet (25 mg total) by mouth daily.   Hair Loss Applies coloring every 1 -2 months . CAN BE A CONTRIBUTING FACTOR.May purchase OTC biotin   Patient have been counseled extensively about nutrition and exercise. Other issues discussed during this visit include: low cholesterol diet, weight control and daily exercise, foot care, annual eye examinations at Ophthalmology,  importance of adherence with medications and regular follow-up. We also discussed long term complications of uncontrolled diabetes and hypertension.   Return in about 3 months (around 12/01/2024).  The patient was given clear instructions to go to ER or return to medical center if symptoms don't improve, worsen or new problems develop. The patient verbalized understanding. The patient was told to call to get  lab results if they haven't heard anything in the next week.   This note has been created with Education officer, environmental. Any transcriptional errors are unintentional.   Frances SHAUNNA Bohr, NP 09/02/2024, 9:26 AM     [1]  Allergies Allergen Reactions   Pravastatin  Sodium Nausea Only  [2]  Current Outpatient Medications on File Prior to Visit  Medication Sig Dispense Refill   Collagen-Vitamin C-Biotin (COLLAGEN PO) Take by mouth.     Cyanocobalamin (B-12 PO) Take by mouth.     omega-3 acid ethyl esters (LOVAZA) 1 g capsule Take by mouth 2 (two) times daily.     VITAMIN D  PO Take by mouth.     No current facility-administered medications on file prior to visit.   "

## 2024-09-02 NOTE — Addendum Note (Signed)
 Addended by: CASIMIR KRABBE R on: 09/02/2024 10:00 AM   Modules accepted: Orders

## 2024-09-02 NOTE — Patient Instructions (Signed)
 La biotina es necesaria para la formacin de cidos grasos y [place], que el cuerpo utiliza como fuente de [place]. Tambin es importante para el metabolismo de los aminocidos y los carbohidratos. La deficiencia de biotina es poco comn; sin embargo, si se presenta, puede provocar erupciones cutneas, cada del cabello, niveles elevados de colesterol en sangre y problemas cardacos.

## 2024-09-03 LAB — TSH+FREE T4
Free T4: 1.25 ng/dL (ref 0.82–1.77)
TSH: 3.16 u[IU]/mL (ref 0.450–4.500)

## 2024-09-03 LAB — VITAMIN D 25 HYDROXY (VIT D DEFICIENCY, FRACTURES): Vit D, 25-Hydroxy: 42 ng/mL (ref 30.0–100.0)

## 2024-09-06 ENCOUNTER — Ambulatory Visit (INDEPENDENT_AMBULATORY_CARE_PROVIDER_SITE_OTHER): Payer: Self-pay | Admitting: Primary Care

## 2024-09-09 ENCOUNTER — Telehealth: Payer: Self-pay | Admitting: Primary Care

## 2024-09-09 NOTE — Telephone Encounter (Signed)
 Contacted pt left vm to resch appt if the pt calls back please resch appt jan 27th

## 2024-09-09 NOTE — Progress Notes (Unsigned)
" ° ° ° °  S:     No chief complaint on file.  64 y.o. female who presents for diabetes evaluation, education, and management. PMH is significant for T2DM, HLD, HTN. No history of ASCVD, CKD, or HF.  Patient was referred by Primary Care Provider, Rosaline Bohr, NP, on 05/30/2024. Last seen 09/02/2024.  Pharmacy Visits: - 06/10/24 decreased metformin  to 1000 mg in the evening - 05/06/2024 increased her Trulicity  to 3 mg weekly.  Patient today arrives in good spirits and presents without any assistance.   Spanish interpreter ***, ***, was used for today's visit. Reports that she is doing well on Trulicity . She notices that she is feeling full when she eats certain quantities. Denies any NV, abdominal pain. Denies any s/sx of hypoglycemia or hyperglycemia.  Family/Social History:  Fhx: DM (both parents), HTN Tobacco: former smoker (stopped 1.5 years ago) Alcohol: none  Current diabetes medications include: Metformin  XR 1000 mg daily, Trulicity  3 mg once weekly  Current hyperlipidemia medications include: Rosuvastatin  10 mg daily  Patient reports adherence to taking all medications as prescribed.   Insurance coverage: self-pay  Patient denies hypoglycemic events.  Patient denies frequent urination. Patient denies neuropathy (nerve pain). Patient denies visual changes. Patient reports self foot exams.   Patient reported dietary habits: Eats 3 meals/day Breakfast: eggs, spinach, cheese, half avocado, 1 slice whole bread, coffee with almond milk and stevia Lunch: lentils, veggies with proteins, fruit (pear, orange, grapefruit) Dinner: eggs, spinach, veggies, fruit, homeade bread, greek yogurt Snacks: not often, maybe popcorn or pistachios Drinks: water (6 bottles/day), [place] water (2 jugs/day), coffee (1x/day), no sugar beverages  Patient-reported exercise habits: Uses the stationary bike at home and walking - has not been to the gym in a while  O:    Lab Results  Component  Value Date   HGBA1C 6.7 06/10/2024   There were no vitals filed for this visit.  Lipid Panel     Component Value Date/Time   CHOL 132 05/30/2024 0918   TRIG 100 05/30/2024 0918   HDL 55 05/30/2024 0918   CHOLHDL 2.4 05/30/2024 0918   LDLCALC 58 05/30/2024 0918    Clinical Atherosclerotic Cardiovascular Disease (ASCVD): No  The 10-year ASCVD risk score (Arnett DK, et al., 2019) is: 6.1%   Values used to calculate the score:     Age: 21 years     Clinically relevant sex: Female     Is Non-Hispanic African American: No     Diabetic: Yes     Tobacco smoker: No     Systolic Blood Pressure: 113 mmHg     Is BP treated: Yes     HDL Cholesterol: 55 mg/dL     Total Cholesterol: 132 mg/dL   A/P: Diabetes Control - A1C.  She is currently asymptomatic. Patient is able to verbalize appropriate medication regimen and hypoglycemia management plan.  -Continue Trulicity  (dulaglutide ) 3 mg once weekly.   -Continue metformin  XR to 1000 mg in the evening with meals.  -Extensively discussed pathophysiology of diabetes, recommended lifestyle interventions, dietary effects on blood sugar control.  -Counseled on s/sx of and management of hypoglycemia.   Written patient instructions provided. Patient verbalized understanding of treatment plan.  Total time in face to face counseling 30 minutes.    Follow-up:  Pharmacist in 3 months PCP clinic visit 12/02/2024 with Rosaline Bohr, NP  Powell Gallus, PharmD, MPH Pharmacy Resident  Herlene Fleeta Morris, PharmD, BCACP, CPP Clinical Pharmacist [facility] [phone]  "

## 2024-09-10 ENCOUNTER — Encounter: Payer: Self-pay | Admitting: Pharmacist

## 2024-09-10 ENCOUNTER — Ambulatory Visit: Payer: Self-pay | Attending: Family Medicine | Admitting: Pharmacist

## 2024-09-10 ENCOUNTER — Other Ambulatory Visit: Payer: Self-pay

## 2024-09-10 DIAGNOSIS — Z7985 Long-term (current) use of injectable non-insulin antidiabetic drugs: Secondary | ICD-10-CM

## 2024-09-10 DIAGNOSIS — E119 Type 2 diabetes mellitus without complications: Secondary | ICD-10-CM

## 2024-09-10 DIAGNOSIS — Z7984 Long term (current) use of oral hypoglycemic drugs: Secondary | ICD-10-CM

## 2024-09-10 LAB — POCT GLYCOSYLATED HEMOGLOBIN (HGB A1C): HbA1c, POC (controlled diabetic range): 6.7 % (ref 0.0–7.0)

## 2024-09-10 MED ORDER — TRULICITY 4.5 MG/0.5ML ~~LOC~~ SOAJ
4.5000 mg | SUBCUTANEOUS | 3 refills | Status: AC
  Filled 2024-09-10: qty 2, 28d supply, fill #0

## 2024-09-10 NOTE — Patient Instructions (Addendum)
 Please purchase Equate Complete Multivitamin/Multimineral Supplement Tablets, Women 50+, from [employer] and take once daily to replace separate vitamins.  Use Lidocaine  patches or gel over the counter for back pain.  Please follow-up in 1-2 months with pharmacy to check on Medicaid coverage and discuss switching to Ozempic.

## 2024-11-05 ENCOUNTER — Ambulatory Visit: Payer: Self-pay | Admitting: Pharmacist

## 2024-11-15 ENCOUNTER — Ambulatory Visit: Admitting: Pharmacist

## 2024-12-02 ENCOUNTER — Ambulatory Visit (INDEPENDENT_AMBULATORY_CARE_PROVIDER_SITE_OTHER): Payer: Self-pay | Admitting: Primary Care
